# Patient Record
Sex: Female | Born: 1935 | Race: Black or African American | Hispanic: No | Smoking: Former smoker
Health system: Southern US, Community
[De-identification: ages and names within clinical notes are randomized; demographics above are authoritative.]

## PROBLEM LIST (undated history)

## (undated) DIAGNOSIS — R634 Abnormal weight loss: Secondary | ICD-10-CM

## (undated) DIAGNOSIS — J302 Other seasonal allergic rhinitis: Secondary | ICD-10-CM

## (undated) DIAGNOSIS — I872 Venous insufficiency (chronic) (peripheral): Secondary | ICD-10-CM

## (undated) DIAGNOSIS — R12 Heartburn: Secondary | ICD-10-CM

## (undated) DIAGNOSIS — R600 Localized edema: Secondary | ICD-10-CM

## (undated) DIAGNOSIS — R739 Hyperglycemia, unspecified: Secondary | ICD-10-CM

## (undated) DIAGNOSIS — C541 Malignant neoplasm of endometrium: Secondary | ICD-10-CM

## (undated) DIAGNOSIS — E785 Hyperlipidemia, unspecified: Secondary | ICD-10-CM

## (undated) DIAGNOSIS — F411 Generalized anxiety disorder: Secondary | ICD-10-CM

## (undated) DIAGNOSIS — M1990 Unspecified osteoarthritis, unspecified site: Secondary | ICD-10-CM

## (undated) DIAGNOSIS — H9190 Unspecified hearing loss, unspecified ear: Secondary | ICD-10-CM

## (undated) DIAGNOSIS — E039 Hypothyroidism, unspecified: Secondary | ICD-10-CM

## (undated) DIAGNOSIS — G4700 Insomnia, unspecified: Secondary | ICD-10-CM

## (undated) DIAGNOSIS — R609 Edema, unspecified: Secondary | ICD-10-CM

## (undated) HISTORY — DX: Malignant neoplasm of endometrium: C54.1

## (undated) HISTORY — PX: CATARACT EXTRACTION, BILATERAL: SHX1313

## (undated) HISTORY — DX: Abnormal weight loss: R63.4

## (undated) HISTORY — DX: Insomnia, unspecified: G47.00

## (undated) HISTORY — DX: Generalized anxiety disorder: F41.1

## (undated) HISTORY — DX: Hyperglycemia, unspecified: R73.9

## (undated) HISTORY — DX: Hyperlipidemia, unspecified: E78.5

---

## 1981-02-10 HISTORY — PX: THYROIDECTOMY, PARTIAL: SHX18

## 2000-09-02 ENCOUNTER — Emergency Department (HOSPITAL_COMMUNITY): Admission: EM | Admit: 2000-09-02 | Discharge: 2000-09-02 | Payer: Self-pay

## 2003-03-20 ENCOUNTER — Emergency Department (HOSPITAL_COMMUNITY): Admission: EM | Admit: 2003-03-20 | Discharge: 2003-03-20 | Payer: Self-pay | Admitting: Emergency Medicine

## 2004-03-18 ENCOUNTER — Emergency Department (HOSPITAL_COMMUNITY): Admission: EM | Admit: 2004-03-18 | Discharge: 2004-03-18 | Payer: Self-pay | Admitting: Family Medicine

## 2004-03-24 ENCOUNTER — Emergency Department (HOSPITAL_COMMUNITY): Admission: EM | Admit: 2004-03-24 | Discharge: 2004-03-24 | Payer: Self-pay | Admitting: Family Medicine

## 2005-02-10 DIAGNOSIS — C541 Malignant neoplasm of endometrium: Secondary | ICD-10-CM

## 2005-02-10 HISTORY — DX: Malignant neoplasm of endometrium: C54.1

## 2005-02-10 HISTORY — PX: ABDOMINAL HYSTERECTOMY: SHX81

## 2005-02-10 HISTORY — PX: OOPHORECTOMY: SHX86

## 2005-07-03 ENCOUNTER — Encounter: Admission: RE | Admit: 2005-07-03 | Discharge: 2005-07-03 | Payer: Self-pay | Admitting: Obstetrics & Gynecology

## 2005-07-08 ENCOUNTER — Ambulatory Visit: Admission: RE | Admit: 2005-07-08 | Discharge: 2005-07-08 | Payer: Self-pay | Admitting: Gynecologic Oncology

## 2005-08-12 ENCOUNTER — Inpatient Hospital Stay (HOSPITAL_COMMUNITY): Admission: RE | Admit: 2005-08-12 | Discharge: 2005-08-15 | Payer: Self-pay | Admitting: Obstetrics & Gynecology

## 2005-08-12 ENCOUNTER — Encounter (INDEPENDENT_AMBULATORY_CARE_PROVIDER_SITE_OTHER): Payer: Self-pay | Admitting: *Deleted

## 2005-08-29 ENCOUNTER — Ambulatory Visit: Payer: Self-pay | Admitting: Internal Medicine

## 2005-09-02 ENCOUNTER — Ambulatory Visit: Admission: RE | Admit: 2005-09-02 | Discharge: 2005-09-02 | Payer: Self-pay | Admitting: Gynecologic Oncology

## 2005-09-30 ENCOUNTER — Ambulatory Visit: Admission: RE | Admit: 2005-09-30 | Discharge: 2005-09-30 | Payer: Self-pay | Admitting: Gynecology

## 2006-01-09 ENCOUNTER — Ambulatory Visit: Admission: RE | Admit: 2006-01-09 | Discharge: 2006-01-09 | Payer: Self-pay | Admitting: Gynecology

## 2006-01-09 ENCOUNTER — Other Ambulatory Visit: Admission: RE | Admit: 2006-01-09 | Discharge: 2006-01-09 | Payer: Self-pay | Admitting: Gynecology

## 2006-01-09 ENCOUNTER — Encounter (INDEPENDENT_AMBULATORY_CARE_PROVIDER_SITE_OTHER): Payer: Self-pay | Admitting: *Deleted

## 2006-08-28 ENCOUNTER — Encounter: Admission: RE | Admit: 2006-08-28 | Discharge: 2006-08-28 | Payer: Self-pay | Admitting: Obstetrics & Gynecology

## 2006-11-03 ENCOUNTER — Other Ambulatory Visit: Admission: RE | Admit: 2006-11-03 | Discharge: 2006-11-03 | Payer: Self-pay | Admitting: Gynecologic Oncology

## 2006-11-03 ENCOUNTER — Ambulatory Visit: Admission: RE | Admit: 2006-11-03 | Discharge: 2006-11-03 | Payer: Self-pay | Admitting: Gynecologic Oncology

## 2006-11-03 ENCOUNTER — Encounter: Payer: Self-pay | Admitting: Gynecologic Oncology

## 2007-04-28 ENCOUNTER — Encounter: Payer: Self-pay | Admitting: Gynecologic Oncology

## 2007-04-28 ENCOUNTER — Encounter: Payer: Self-pay | Admitting: Internal Medicine

## 2007-04-28 ENCOUNTER — Ambulatory Visit: Admission: RE | Admit: 2007-04-28 | Discharge: 2007-04-28 | Payer: Self-pay | Admitting: Gynecologic Oncology

## 2007-04-28 ENCOUNTER — Other Ambulatory Visit: Admission: RE | Admit: 2007-04-28 | Discharge: 2007-04-28 | Payer: Self-pay | Admitting: Gynecologic Oncology

## 2007-08-11 ENCOUNTER — Ambulatory Visit: Payer: Self-pay | Admitting: Internal Medicine

## 2007-08-11 DIAGNOSIS — R5383 Other fatigue: Secondary | ICD-10-CM

## 2007-08-11 DIAGNOSIS — R5381 Other malaise: Secondary | ICD-10-CM

## 2007-08-11 DIAGNOSIS — G4700 Insomnia, unspecified: Secondary | ICD-10-CM

## 2007-08-11 DIAGNOSIS — E785 Hyperlipidemia, unspecified: Secondary | ICD-10-CM

## 2007-08-11 DIAGNOSIS — F411 Generalized anxiety disorder: Secondary | ICD-10-CM

## 2007-08-17 LAB — CONVERTED CEMR LAB
ALT: 31 units/L (ref 0–35)
Albumin: 3.5 g/dL (ref 3.5–5.2)
BUN: 17 mg/dL (ref 6–23)
Basophils Relative: 0 % (ref 0.0–1.0)
Bilirubin, Direct: 0.1 mg/dL (ref 0.0–0.3)
CO2: 31 meq/L (ref 19–32)
Calcium: 9.5 mg/dL (ref 8.4–10.5)
Creatinine, Ser: 0.7 mg/dL (ref 0.4–1.2)
Eosinophils Relative: 0.7 % (ref 0.0–5.0)
Glucose, Bld: 99 mg/dL (ref 70–99)
HCT: 41.8 % (ref 36.0–46.0)
Hemoglobin: 14.1 g/dL (ref 12.0–15.0)
LDL Cholesterol: 119 mg/dL — ABNORMAL HIGH (ref 0–99)
Lymphocytes Relative: 39.2 % (ref 12.0–46.0)
Monocytes Relative: 9.8 % (ref 3.0–12.0)
Neutro Abs: 8 10*3/uL — ABNORMAL HIGH (ref 1.4–7.7)
RBC: 4.52 M/uL (ref 3.87–5.11)
TSH: 0.94 microintl units/mL (ref 0.35–5.50)
Total CHOL/HDL Ratio: 3.6
Total Protein: 7.8 g/dL (ref 6.0–8.3)

## 2007-08-18 ENCOUNTER — Ambulatory Visit: Payer: Self-pay | Admitting: Internal Medicine

## 2007-08-18 LAB — CONVERTED CEMR LAB
HCT: 39.3 % (ref 36.0–46.0)
Monocytes Absolute: 0.5 10*3/uL (ref 0.1–1.0)
Monocytes Relative: 8.6 % (ref 3.0–12.0)
Neutrophils Relative %: 49 % (ref 43.0–77.0)
Platelets: 222 10*3/uL (ref 150–400)
RDW: 12.9 % (ref 11.5–14.6)
Vit D, 1,25-Dihydroxy: 25 — ABNORMAL LOW (ref 30–89)

## 2007-08-24 ENCOUNTER — Telehealth: Payer: Self-pay | Admitting: Internal Medicine

## 2007-08-27 ENCOUNTER — Encounter: Payer: Self-pay | Admitting: Internal Medicine

## 2007-11-18 ENCOUNTER — Ambulatory Visit: Payer: Self-pay | Admitting: Internal Medicine

## 2007-12-24 ENCOUNTER — Encounter: Payer: Self-pay | Admitting: Gynecology

## 2007-12-24 ENCOUNTER — Other Ambulatory Visit: Admission: RE | Admit: 2007-12-24 | Discharge: 2007-12-24 | Payer: Self-pay | Admitting: Gynecology

## 2007-12-24 ENCOUNTER — Ambulatory Visit: Admission: RE | Admit: 2007-12-24 | Discharge: 2007-12-24 | Payer: Self-pay | Admitting: Gynecology

## 2008-02-14 ENCOUNTER — Ambulatory Visit: Payer: Self-pay | Admitting: Internal Medicine

## 2008-02-14 DIAGNOSIS — E669 Obesity, unspecified: Secondary | ICD-10-CM

## 2008-02-15 LAB — CONVERTED CEMR LAB
Cholesterol: 222 mg/dL (ref 0–200)
Total CHOL/HDL Ratio: 2.9
Triglycerides: 92 mg/dL (ref 0–149)

## 2008-05-08 ENCOUNTER — Emergency Department (HOSPITAL_COMMUNITY): Admission: EM | Admit: 2008-05-08 | Discharge: 2008-05-08 | Payer: Self-pay | Admitting: Emergency Medicine

## 2010-03-03 ENCOUNTER — Encounter: Payer: Self-pay | Admitting: Obstetrics & Gynecology

## 2010-06-25 NOTE — Consult Note (Signed)
Doris Zamora, Doris Zamora                ACCOUNT NO.:  [id number]   MEDICAL RECORD NO.:  [id number]          PATIENT TYPE:  OUT   LOCATION:  GYN                          FACILITY:  [facility]   PHYSICIAN:  De Blanch, M.D.DATE OF BIRTH:  1930-12-06   DATE OF CONSULTATION:  12/24/2007  DATE OF DISCHARGE:                                 CONSULTATION   CHIEF COMPLAINT:  Endometrial cancer.   INTERVAL HISTORY:  Since her last visit the patient has done well.  She  has had no health problems.  She specifically denies any GI or GU  symptoms. She has no pelvic pain, pressure vaginal bleeding or  discharge.  She has undertaken weight loss program, is walking two miles  5 days a week and has lost 50 pounds.  She is also doing some dieting.   HISTORY OF PRESENT ILLNESS:  The patient underwent initial surgical  resection and staging in July of 2007.  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  She was offered adjuvant therapy, which  she declined, and she has been followed closely since that time with no  evidence of recurrent disease.   CURRENT MEDICATIONS:  Occasional nerve pill and sleeping pill,  multivitamins.   DRUG ALLERGIES:  None.   OBSTETRICAL HISTORY:  Gravida 3.   SOCIAL HISTORY:  The patient is widowed.  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   PAST SURGICAL HISTORY:  Partial thyroidectomy, foot surgery,  hysteroscopy, D and C, TAH-BSO, pelvic and periaortic lymphadenectomy.   REVIEW OF SYSTEMS:  A 10-point comprehensive review of systems was  negative except as noted above.   PHYSICAL EXAM:  VITAL SIGNS:  Weight 196 pounds, blood pressure 120/70,  pulse 80, respiratory rate 20.  GENERAL:  The patient is a healthy African American female in no acute  distress.  HEENT: Negative.  NECK:  Supple without thyromegaly. There is no supraclavicular or  inguinal adenopathy.  ABDOMEN:  Soft, nontender.  No mass, organomegaly, ascites or hernias  noted.  PELVIC:  EG, BUS vagina, and bilateral urethra are normal.  Cervix and  uterus are surgically absent.  Adnexa without masses.  Rectovaginal exam  confirms.  LOWER EXTREMITIES:  Without edema or varicosities.   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   PLAN:  The patient will return to see Dr. Varney Baas in 6 months.  Return to see [place] in 1 year.  Pap smears were obtained today.      De Blanch, M.D.  Electronically Signed     DC/MEDQ  D:  12/24/2007  T:  12/24/2007  Job:  [id number]   cc:   Freddy Finner, M.D.  Fax: [phone]   Barbette Hair. Artist Pais, DO  [address] [place], [place] [address]   Telford Nab, R.N.  [address]  [place], [place] [address]

## 2010-06-25 NOTE — Consult Note (Signed)
Doris Zamora, Doris Zamora                ACCOUNT NO.:  [id number]   MEDICAL RECORD NO.:  [id number]          PATIENT TYPE:  OUT   LOCATION:  GYN                          FACILITY:  [facility]   PHYSICIAN:  Paola A. Duard Brady, MD    DATE OF BIRTH:  06-07-1930   DATE OF CONSULTATION:  11/03/2006  DATE OF DISCHARGE:                                 CONSULTATION   The patient is a 77 year old with a stage IB, grade 3 serous carcinoma  of the endometrium who underwent initial surgery in July 2007.  Her  pathology revealed no malignant cells.  Within the uterus she had a  poorly differentiated carcinoma with features of a serous carcinoma and  poorly differentiated endometrioid adenocarcinoma with superficial  myometrial invasion.  On pathology she had negative cervix, negative  tubes and ovaries.  She had 0/9 lymph nodes.  The patient was offered  adjuvant therapy which she declined and has been undergoing close follow-  up.  She overall is doing quite well since we last saw her and really  has no significant complaints.   REVIEW OF SYSTEMS:  She denies any chest pain, shortness of breath,  nausea, vomiting, fevers, chills, headaches or visual changes.  She  denies any vaginal bleeding, change in bowel or bladder habits,  abdominal bloating or abdominal pain.   MEDICATIONS:  Multivitamins.   ALLERGIES:  NONE.   SOCIAL HISTORY:  Unchanged.   PHYSICAL EXAMINATION:  Weight 232 pounds which is up 4 pounds from her  last visit. A well-nourished, well-developed female in no acute  distress.  NECK:  Supple.  There is no lymphadenopathy, no thyromegaly.  LUNGS:  Clear to auscultation bilaterally.  CARDIOVASCULAR:  Regular rate and rhythm.  ABDOMEN:  Obese, soft, nontender, nondistended.  No palpable mass or  hepatosplenomegaly.  She had a well-healed vertical skin incision was no  evidence of any hernias.  Groins are negative for adenopathy.  EXTREMITIES:  She has 1+ bilateral lower extremity edema.  PELVIC:  External genitalia is within normal limits.  The vagina is  unremarkable.  It is atrophic.  The vaginal cuff is visualized.  No  visible lesions.  Pap smear was obtained.  Bimanual examination reveals  no masses or nodularity.  Rectal confirms.   ASSESSMENT:  A 77 year old with stage IB grade 3 serous carcinoma of the  endometrium with no evidence of recurrent disease.   PLAN:  We will follow up on the results of her Pap smear from today.  She will return to see [place] in 6 months and see Dr. Jennette Kettle in 3 months.      Paola A. Duard Brady, MD  Electronically Signed     PAG/MEDQ  D:  11/03/2006  T:  11/04/2006  Job:  [id number]   cc:   Telford Nab, R.N.  [address]  [place], [place] [address]   Barbette Hair. [place], DO  [address] [place], [place] [address]   W. Varney Baas, M.D.  Fax: [phone]

## 2010-06-25 NOTE — Consult Note (Signed)
NAMEELANIE, HAMMITT                ACCOUNT NO.:  [id number]   MEDICAL RECORD NO.:  [id number]          PATIENT TYPE:  OUT   LOCATION:  GYN                          FACILITY:  [facility]   PHYSICIAN:  Paola A. Duard Brady, MD    DATE OF BIRTH:  Oct 17, 1930   DATE OF CONSULTATION:  04/28/2007  DATE OF DISCHARGE:                                 CONSULTATION   Doris Zamora is a very pleasant [age] year old with stage IB  grade 3 serous carcinoma of the endometrium who underwent initial  surgery in July 2007.  Final pathology revealed a poorly differentiated  carcinoma with features of a serous carcinoma and poorly differentiated  endometrium adenocarcinoma with superficial myometrial invasion.  She  had negative cervix, negative tubes and ovaries, and negative lymph  nodes.  She was offered adjuvant therapy, which she declined and she has  been offered close followup since that time.  Fortunately, she has had  no evidence of recurrent disease.  She comes in today for followup.  She  was last seen by [place] in September 2008 with negative exam and negative  Pap smear.  She was seen by Dr. Jennette Kettle per her report 3 months ago, again  with a negative exam.   REVIEW OF SYSTEMS:  She has any chest pain short of breath, nausea,  vomiting, fevers, chills, headaches or visual changes.  She has any  vaginal bleeding, change in bowel or bladder habits, abdominal bloating,  abdominal pain.  She states she is anxious to start exercising.  She has  been gaining weight, but has really been quite sedentary.  In addition  some of her family members have needed her to help them with different  issues and she has been too busy running around taking care of them to  take the time to take care of herself.   MEDICATIONS:  None.  She does take multivitamins.   ALLERGIES:  None.   PHYSICAL EXAMINATION:  Weight 235 pounds which is up 3 pounds since her  last visit.  Well-nourished, alert female in no acute  distress.  NECK:  Supple with no lymphadenopathy, no thyromegaly.  Lungs were clear to auscultation bilaterally.  CARDIOVASCULAR:  Regular rate and rhythm.  Abdomen is obese.  She has a well-healed vertical skin incision.  As no  evidence of incisional hernia.  Abdomen is soft, nontender,  nondistended.  No palpable masses or hepatosplenomegaly.  Groins are  negative for adenopathy.  EXTREMITIES:  She has 1+ pitting edema equal bilaterally in the ankles.  PELVIC:  External genitalia is within normal limits.  Vagina is markedly  atrophic.  The vaginal cuff is visualized.  No visible lesions.  ThinPrep Pap was without difficulty.  Bimanual examination reveals no  masses or nodularity.  Rectal confirms.   ASSESSMENT:  A 77 year old with a stage IB grade 3 endometrioid and  serous carcinoma of the endometrium who has no clinical evidence of  recurrent disease.   PLAN:  Follow results for Pap smear from today.  She will see Dr. Jennette Kettle  in 3 months.  Return to see [place]  in 6 months.  She knows to contact us if  she has any symptoms of increased abdominal bloating, bleeding, pelvic  pain, pressure, shortness of breath or cough.      Paola A. Duard Brady, MD  Electronically Signed     PAG/MEDQ  D:  04/28/2007  T:  04/28/2007  Job:  [id number]   cc:   Freddy Finner, M.D.  Fax: [phone]   Barbette Hair. Artist Pais, DO  [address] [place], [place] [address]   Telford Nab, R.N.  [address]  [place], [place] [address]

## 2010-06-28 NOTE — Consult Note (Signed)
NAMESHERIN, MURDOCH                ACCOUNT NO.:  [id number]   MEDICAL RECORD NO.:  [id number]          PATIENT TYPE:  OUT   LOCATION:  GYN                          FACILITY:  [facility]   PHYSICIAN:  De Blanch, M.D.DATE OF BIRTH:  December 11, 1930   DATE OF CONSULTATION:  01/09/2006  DATE OF DISCHARGE:                                 CONSULTATION   CHIEF COMPLAINT:  Endometrial cancer.   INTERVAL HISTORY:  Since her last visit, the patient has done well. She  denies any GI or GU symptoms. Has no pelvic pain or pressure, vaginal  bleeding or discharge. Functional status is excellent.   HISTORY OF PRESENT ILLNESS:  The patient has a stage IIB grade 3  endometrial carcinoma undergoing initial surgery July 2007. She chose  not to receive any postoperative radiation therapy or chemotherapy and  has been followed since that time with no evidence of recurrent disease.   PAST MEDICAL HISTORY:  Medical illnesses:  None.   PAST SURGICAL HISTORY:  1. Partial thyromegaly.  2. Foot surgery.  3. Hysteroscopy.  4. D&C.  5. TAH, BSO, pelvic and periaortic lymphadenectomy.   OBSTETRICAL HISTORY:  Gravida 3.   MEDICATIONS:  Multivitamins.   DRUG ALLERGIES:  None.   SOCIAL HISTORY:  The patient is widowed. 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   REVIEW OF SYSTEMS:  Ten-point comprehensive review of systems negative  except as noted above.   PHYSICAL EXAMINATION:  Weight 228 pounds, blood pressure 130/70, pulse  80, respiratory rate 20.  GENERAL:  The patient is a pleasant obese black female in no acute  distress.  HEENT:  Is negative.  NECK:  Is supple without thyromegaly. There is supraclavicular or  inguinal adenopathy.  ABDOMEN:  Is soft and nontender. No masses, organomegaly, ascites or  hernias noted. Midline incision is well.  PELVIC:  EGBUS, vagina, bladder, urethra are normal. Cervix and uterus  surgically absent. Adnexa without masses. Rectovaginal exam  confirms.  LOWER EXTREMITIES:  1+ ankle edema.   IMPRESSION:  Stage IIB grade 3 endometrial carcinoma. No evidence of  recurrent disease.   PLAN:  Pap smear was obtained. The patient will return to see Dr. Konrad Dolores, her primary gynecologist, in three months and return to see [place] in  six months.      De Blanch, M.D.  Electronically Signed     DC/MEDQ  D:  01/09/2006  T:  01/10/2006  Job:  [id number]

## 2010-06-28 NOTE — Consult Note (Signed)
Doris Zamora, Doris Zamora                ACCOUNT NO.:  [id number]   MEDICAL RECORD NO.:  [id number]          PATIENT TYPE:  OUT   LOCATION:  GYN                          FACILITY:  [facility]   PHYSICIAN:  John T. Kyla Balzarine, M.D.    DATE OF BIRTH:  1930/03/10   DATE OF CONSULTATION:  07/08/2005  DATE OF DISCHARGE:                                   CONSULTATION   CHIEF COMPLAINT:  This 77 year old woman is seen at the request of W. Varney Baas, M.D. for recommendations regarding management of grade 3 endometrioid  adenocarcinoma.  The patient underwent menopause of greater than 20 years  ago.  She was on some type of hormonal cream given to her by Tod Persia, but has not taken this for many years.  In March, she had an  episode of back pain followed by vaginal bleeding which stopped  spontaneously.  She presented to her M.D. 1-1/2 months later after self-  medication with Goldenseal.  She denies active bleeding but underwent D&C on  05/15 along with hysteroscopy after Pap smear revealed AGUS and ultrasound  showed marked endometrial thickening.  Findings at hysteroscopy included  polypoid endometrium.  Final pathology revealed poorly differentiated  endometrioid carcinoma involving both endometrial and endocervical  curettings.   PAST MEDICAL HISTORY:  The patient has not been followed routinely for  medical care.  She underwent partial thyroidectomy in 1983 and states that  she had normal thyroid function tests approximately 20 years ago.  She has  undergone foot surgery and recent hysteroscopic D&C.  She is status post  NSVD x3.   MEDICATIONS:  Vitamins and assorted herbs.   ALLERGIES:  None known.   PERSONAL AND SOCIAL HISTORY:  Widowed, retired [employer] operational officer.  She smoked until  9 years ago and has been abstinent since.  She admits to occasional social  ethanol.   REVIEW OF SYSTEMS:  Other than noted above, negative in 10 systems.  The  patient is active without limitations.   PHYSICAL EXAMINATION:  VITAL SIGNS:  Weight 230 pounds, height 5 feet 6  inches, blood pressure 138/76, pulse 68, temperature afebrile.  GENERAL:  The patient is alert and oriented x3 in no acute distress.  ENT:  Has benign with clear oropharynx and no scleral icterus.  Normal  extraocular movements.  NECK:  Supple without goiter.  LUNGS:  Lung fields clear.  HEART:  Regular with no JVD.  Lymph survey negative for pathologic  lymphadenopathy.  There is no back or CVA tenderness.  ABDOMEN:  The abdomen is soft and benign.  No ascites, tenderness, mass,  ascites or organomegaly.  EXTREMITIES:  Full strength and range of motion without edema.  PELVIC:  External genitalia and [place] normal inspection, palpation.  Bladder,  urethra are normal.  Cervix is mobile without gross lesions although it is  slightly wide but is not particularly firm to palpation.  Bimanual and  rectovaginal examinations reveal upper limits size uterus with no adnexal  pathology palpable.  No parametrial nodularity.   ASSESSMENT:  Clinical stage 2 endometrial carcinoma, grade 3.  PLAN:  I had a long discussion with the patient and her daughter.  The  patient wishes surgery as soon as feasible.  I discussed alternatives of a  laparoscopic or open abdominal approach.  She is at borderline in size for  what I would consider laparoscopic staging to be feasible for.  We will  check our schedule at [facility]; Dr. Jennette Kettle was unable to be available for surgery  at [facility] next week and our next available appointment with the  availability of laparoscopic staging of endometrial cancer would be 4 weeks  or more away at [facility].      Jonny Ruiz T. Kyla Balzarine, M.D.  Electronically Signed     JTS/MEDQ  D:  07/08/2005  T:  07/08/2005  Job:  [id number]   cc:   Freddy Finner, M.D.  Fax: [phone]   Telford Nab, R.N.  [address]  [place], [place] [address]

## 2010-06-28 NOTE — Consult Note (Signed)
NAMERANE, DUMM                ACCOUNT NO.:  [id number]   MEDICAL RECORD NO.:  [id number]          PATIENT TYPE:  OUT   LOCATION:  GYN                          FACILITY:  [facility]   PHYSICIAN:  John T. Kyla Balzarine, M.D.    DATE OF BIRTH:  1930/09/25   DATE OF CONSULTATION:  09/02/2005  DATE OF DISCHARGE:                                   CONSULTATION   CHIEF COMPLAINT:  Follow-up of pathology after surgery for endometrial  cancer.   HISTORY:  The patient was explored by Dr. Stanford Breed on August 12, 2005,  undergoing TAH/BSO, pelvic and aortic lymphadenectomy and peritoneal  washings for a high-grade endometrial treatment cancer.  She has had an  uncomplicated convalescence and returns for incision check and discussion of  pathology.   EXAM:  VITAL SIGNS:  Weight 223 pounds, temperature afebrile and vital signs  stable.  ABDOMEN:  Inspection of the abdomen reveals a well-healed midline incision  without erythema or fascial defect on Valsalva.  The incision is nontender.  EXTREMITIES:  No edema.  PELVIC:  Deferred per patient request.   LAB:  Final pathology revealed a poorly-differentiated mixed serous and  endometrioid FIGO grade 3 carcinoma of the endometrium.  Tumor invaded 0.4  out of 1.8 cm total myometrium.  Cervix, tubes and ovaries, 9 total lymph  nodes and washings were negative (stage I-B, grade 3 lesion).   ASSESSMENT:  Stage I-B, grade 3 a papillary serous carcinoma of the  endometrium, convalescing from surgery.   PLAN AND RECOMMENDATIONS:  I had a long discussion with the patient  regarding her pathology.  I would estimate her risk of recurrence somewhere  in the neighborhood of 40% with no further treatment.  We discussed  potential use of vaginal brachytherapy and sandwich treatment with  chemotherapy versus chemotherapy alone.  The patient is quite apprehensive  about any further treatment and I spent considerable time explaining the  rationale,  effects and side  effects of chemotherapy.  She does not wish to make a  decision regarding adjuvant therapy currently and will return for pelvic  examination at 5-6 weeks postoperatively.  I would strongly consider  sandwich therapy with Taxol and carboplatin combined with careful radiation.      John T. Kyla Balzarine, M.D.  Electronically Signed     JTS/MEDQ  D:  09/02/2005  T:  09/03/2005  Job:  [id number]   cc:   Freddy Finner, M.D.  Fax: [phone]   Roseanna Rainbow, M.D.  Fax: [phone]   Telford Nab, R.N.  [address]  [place], [place] [address]

## 2010-06-28 NOTE — Op Note (Signed)
NAMELACHELE, LIEVANOS                ACCOUNT NO.:  [id number]   MEDICAL RECORD NO.:  [id number]          PATIENT TYPE:  INP   LOCATION:  0004                         FACILITY:  [facility]   PHYSICIAN:  De Blanch, M.D.DATE OF BIRTH:  06-Feb-1931   DATE OF PROCEDURE:  08/12/2005  DATE OF DISCHARGE:                                 OPERATIVE REPORT   PREOPERATIVE DIAGNOSIS:  Grade 3 endometrial adenocarcinoma.   POSTOPERATIVE DIAGNOSIS:  Grade 3 endometrial adenocarcinoma.   PROCEDURE:  Total abdominal hysterectomy, bilateral salpingo-oophorectomy,  pelvic and periaortic lymphadenectomy.   SURGEON:  De Blanch, M.D.   FIRST ASSISTANT:  Roseanna Rainbow, M.D.; Telford Nab, R.N.   ANESTHESIA:  General orotracheal tube.   ESTIMATED BLOOD LOSS:  200 mL.   SURGICAL FINDINGS:  At the time of exploratory laparotomy, the upper abdomen  was normal.  The uterus was normal-sized with what appeared to be a  paracervical fibroid on the left, measuring approximately 4 cm.  Tubes and  ovaries appeared normal on frozen section.  We were told that there was  invasion but not to the outer half of the myometrium.  The pelvic lymph  nodes were somewhat thickened.   PROCEDURE:  The patient brought to the operating room and after satisfactory  attainment of general anesthesia, was placed in modified lithotomy position  in [place] stirrups.  Anterior abdominal wall, perineum and vagina were  prepped with Betadine, Foley catheter was placed, and the patient was  draped.  The abdomen was entered through a midline incision.  Peritoneal  washings were obtained from the pelvis.  The upper abdomen and pelvis were  explored with the above-noted findings.  A Bookwalter retractor was  assembled and bowel was packed out of pelvis.  The uterus was grasped with  long Kelly clamps.  Round ligaments were divided with cautery and the  retroperitoneal spaces opened, identifying the sidewall  vessels, ureter and  the paravesical and pararectal spaces.  The ovarian vessels were  skeletonized, clamped, cut, free tied and suture ligated.  Bladder flap was  advanced through sharp and blunt dissection.  The uterine vessels were  skeletonized, clamped, cut, suture ligated with 2-0 Vicryl suture.  In  stepwise fashion paracervical and cardinal ligaments were clamped, cut and  suture ligated.  Vaginal angles were crossclamped and the vagina transected  at its junction with the cervix.  The uterus, cervix, tubes and ovaries were  sent to pathology for frozen section with the above-noted findings.  Vaginal  cuff was closed with interrupted figure-of-eight sutures of 0 Vicryl.   The retractors repositioned and the pelvic sidewalls exposed.  It was noted  the patient had very tortuous external iliac arteries bilaterally.  The  lymph node bearing tissue overlying the external artery, external vein,  internal iliac artery and obturator fossa was removed.  Hemostasis was  primarily achieved with cautery and some hemoclips.  Care was taken to avoid  vascular injury or injury to the obturator nerve which was fully visualized.  Similar procedure was performed on both sides of the pelvis.   The retractors were  then repositioned and the incision extended so as to  allow exposure of the aortic chain.  The peritoneum overlying the right  common iliac artery and aorta was incised.  The right ureter was identified  and reflected laterally.  The duodenum was elevated superiorly.  Lymph node  bearing tissue overlying the vena cava and aorta was removed with some  difficulty.  Exposure was difficult, given the patient's obesity.  Again,  hemostasis achieved with cautery, and there were no complications throughout  this dissection.   The surgical bed was inspected and found to be hemostatic, both the aortic  chain and pelvis.  The pelvis was irrigated with saline.   Packs and retractors were removed  and the anterior abdominal wall was closed  in layers, the first being a running MASS closure using #1 PDS.  Subcutaneous tissue was irrigated.  Hemostasis achieved with cautery and the  skin closed with staples.  The patient was awakened from anesthesia and  taken to the recovery room in satisfactory condition.  Sponge, needle was  then counts correct x2.      De Blanch, M.D.  Electronically Signed     DC/MEDQ  D:  08/12/2005  T:  08/12/2005  Job:  [id number]   cc:   Freddy Finner, M.D.  Fax: [phone]   Roseanna Rainbow, M.D.  Fax: [phone]   Telford Nab, R.N.  [address]  [place], [place] [address]

## 2010-06-28 NOTE — Consult Note (Signed)
Doris Zamora, Doris Zamora                ACCOUNT NO.:  [id number]   MEDICAL RECORD NO.:  [id number]          PATIENT TYPE:  OUT   LOCATION:  GYN                          FACILITY:  [facility]   PHYSICIAN:  De Blanch, M.D.DATE OF BIRTH:  [birth year]   DATE OF CONSULTATION:  09/30/2005  DATE OF DISCHARGE:  09/30/2005                                   CONSULTATION   CHIEF COMPLAINT:  Endometrial cancer.   INTERVAL HISTORY:  The patient continues to recover from surgery which was  performed on August 12, 2005.  She specifically returns today to discuss her  pathology report and treatment options.  Final pathology of her hysterectomy  specimen showed a poorly differentiated endometrial cancer invading 4 mm out  of an 18 mm myometrial thickness.  All lymph nodes and peritoneal washings  were negative (stage IB grade 3 endometrial cancer).   PAST MEDICAL HISTORY:   MEDICAL ILLNESSES:  None.   PAST SURGICAL HISTORY:  1. Partial thyroidectomy 1983.  2. Foot surgery.  3. Hysteroscopy.  4. D&C.   OBSTETRICAL HISTORY:  Gravida 3.   MEDICATIONS:  Vitamins.   DRUG ALLERGIES:  None.   SOCIAL HISTORY:  Widowed.  The patient is a retired [employer] operational officer.  She is a  prior smoker.   REVIEW OF SYSTEMS:  10-point comprehensive review of systems negative except  as noted above.   PHYSICAL EXAMINATION:  VITAL SIGNS:  Weight 222 pounds.  ABDOMEN:  Soft, nontender.  Midline incision is healing well.  PELVIC:  EGBUS, vagina, bladder, urethra are normal.  Cervix and uterus  surgically absent.  Adnexa without masses.  Cuff is healing nicely.  EXTREMITIES:  Lower extremities without edema or varicosities.   IMPRESSION:  Stage IB grade 3 endometrial cancer.  Discussed the pathology  with the patient and her family as well as the usual recommendation for  consideration of pelvic radiation and possible sandwich therapy using  chemotherapy.  The patient is aware that radiation would result in decrease  in  local recurrence and we believe systemic therapy might decrease distant  recurrence.  On the other hand the patient understands that close  observation is an option and that we do not have prospective randomized data  from clinical trials showing an improvement in  survival.  After considering all pros and cons, potential side effects, and  potential benefits, the patient wishes not to receive any adjuvant therapy  and will be followed expectantly.  She will return to see [place] in three months  for continued evaluation.      De Blanch, M.D.  Electronically Signed     DC/MEDQ  D:  10/03/2005  T:  10/03/2005  Job:  [id number]   cc:   Freddy Finner, M.D.  Fax: [phone]   Telford Nab, R.N.  [address]  [place], [place] [address]

## 2010-06-28 NOTE — Discharge Summary (Signed)
Doris Zamora, Doris Zamora                ACCOUNT NO.:  [id number]   MEDICAL RECORD NO.:  [id number]          PATIENT TYPE:  INP   LOCATION:  1613                         FACILITY:  [facility]   PHYSICIAN:  Roseanna Rainbow, M.D.DATE OF BIRTH:  Jul 16, 1930   DATE OF ADMISSION:  08/12/2005  DATE OF DISCHARGE:  08/15/2005                                 DISCHARGE SUMMARY   CHIEF COMPLAINT:  The patient is a 77 year old with a recently diagnosed  grade 3 endometrioid adenocarcinoma who presents for operative management.  Please see the dictated operative summary as per Dr. Ronita Hipps for further  details.   HOSPITAL COURSE:  The patient was admitted.  She underwent a total abdominal  hysterectomy and bilateral salpingo-oophorectomy with pelvic and aortic  lymphadenectomies.  Please see the dictated operative summary.  On  postoperative day one, her hemoglobin was 11 and her electrolytes were  normal. The remainder of her hospital course was uneventful.  Her diet was  advanced.  She was discharged to home on postoperative day three  tolerating  a regular diet.   DISCHARGE DIAGNOSIS:  Grade 3 endometrial cancer.   PROCEDURE:  Abdominal hysterectomy and bilateral salpingo-oophorectomy,  pelvic and aortic lymphadenectomies.   CONDITION:  Stable.   DIET:  Regular.   ACTIVITY:  Increase activity slowly, no sexual activity for six weeks, no  driving for two weeks, no lifting for six weeks.   MEDICATIONS:  Resume home medications, Percocet 1-2 tabs every 6 hours as  needed.   DISPOSITION:  The patient was to follow up in the GYN oncology office on  July9.      Roseanna Rainbow, M.D.  Electronically Signed     LAJ/MEDQ  D:  09/24/2005  T:  09/24/2005  Job:  [id number]   cc:   Telford Nab, R.N.  [name]  [place], [place] [address]   W. Varney Baas, M.D.  Fax: [phone]

## 2010-06-28 NOTE — Assessment & Plan Note (Signed)
[facility]                             PRIMARY CARE OFFICE NOTE   NAME:Fuelling, Rhoda M                       MRN:          [id number]  DATE:08/29/2005                            DOB:          May 03, 1930    CHIEF COMPLAINT:  New patient to the practice.   HISTORY OF PRESENT ILLNESS:  The patient is a 77 year old African American  female, here to establish primary care.  The patient has not had a primary  care doctor in the past.   PAST MEDICAL HISTORY:  Notable for recent hospitalization for endometrial  carcinoma.  She underwent total abdominal hysterectomy and bilateral  salpingo-oophorectomy as well as pelvic and periaortic lymphadenectomy.  The  patient, in March 2007, had episode of back pain followed by vaginal  bleeding.  She underwent a hysteroscopy as well as D&C.  Ultrasound showed  marked endometrial thickening and pathology showed poorly differentiated  endometrioid carcinoma.  The patient has recovered quite well from this  surgery.  She states that her abdominal incision has healed without  problems.  Her other past medical history is significant for thyroidectomy  in 1983.  The patient is a somewhat poor historian, and states that her  thyroid was not working and she underwent a thyroidectomy.  She was on  Synthroid for 1-2 years, but she thought that the Synthroid medication  caused facial swelling.  She discontinued Synthroid medication on her own,  and reports that on followup blood tests, her thyroid function had returned  to normal, and has not been on thyroid replacement for many years.  Only  other history is positive hepatitis in 1983, presumed hepatitis B that has  cleared.  She denies any history of chronic or active hepatitis.   SOCIAL HISTORY:  The patient is widowed, has 3 children.  Two of the  children are still living with her.  She is a retired [employer] operational officer.   HABITS:  She denies any alcohol use.  Quit tobacco  approximately 9 years  ago, but smoked for 40 years before quitting.   CURRENT MEDICATIONS:  She does not take any prescription medications, but  takes multiple supplements, including:  1.  Alpha lipoic acid 300 mg 1 a day.  2.  Coenzyme Q once a day.  3.  Caltrate 600 mg once a day.  4.  Chromium 400 mg once a day.  5.  Coral Calcium Complete once a day.  6.  Omega 3 fatty acids once a day.   ALLERGIES:  REPORTED ALLERGIES TO FLU AND PNEUMONIA SHOTS.   FAMILY HISTORY:  Mother deceased at age 86, had type 2 diabetes.  Father  deceased at age 85 of unknown cause.   The patient has not had any colon screening.   REVIEW OF SYSTEMS:  The patient denies any fevers, chills.  No HEENT  symptoms.  Denies chest pain or shortness of breath.  She patient has lost  some weight since her surgery, but her appetite is returning.   Review of her laboratory data from her previous hospitalization is notable  for elevated blood  sugar, and this was obtained at 5:30 in the morning,  presumed fasting, at 133.  The patient had mild anemia of 11.5 and 34.3 H&H.  Thyroid studies were not performed.  The patient had a pre-op chest x-ray.  Lungs showed mild diffuse peribronchial thickening, but no evidence of  active infiltrate or edema.   PHYSICAL EXAMINATION:  VITAL SIGNS:  Height is 5 feet 5-1/2 inches, weight  is 224 pounds, temperature is 97.4, pulse is 70, BP is 141/75 in the left  arm in the seated position.  GENERAL:  The patient is a pleasant, overweight 77 year old African-American  female in no apparent distress.  HEENT:  Normocephalic, atraumatic.  Pupils were equal and reactive to light  bilaterally.  Extraocular motility was intact.  The patient was anicteric.  The conjunctivae were within normal limits.  External auditory canals and  tympanic membranes were clear bilaterally.  Hearing was grossly normal.  Oropharyngeal exam revealed upper and lower dental plates, otherwise   unremarkable.  NECK:  Supple, no adenopathy or carotid bruit.  The patient had lower neck  scar from previous thyroidectomy.  CHEST:  Normal respiratory effort.  The patient had mild decreased breath  sounds at the bases, but no rhonchi, rales or wheezing.  CARDIOVASCULAR:  Regular rate and rhythm.  No significant no murmurs, rubs  or gallops appreciated.  ABDOMEN:  Soft.  The patient had midline scar that was healing.  No  surrounding erythema.  MUSCULOSKELETAL:  No clubbing, cyanosis or edema.  SKIN:  Warm and dry.  EXTREMITIES:  The patient had slightly diminished pedis dorsalis pulses of  her lower extremities.  NEUROLOGIC:  Cranial nerves II-XII intact.  She was nonfocal.   IMPRESSION/RECOMMENDATIONS:  1.  Grade 3 endometrial adenocarcinoma, status post total abdominal      hysterectomy and bilateral salpingo-oophorectomy.  2.  History of thyroid disorder, status post thyroidectomy in 1983, unknown      thyroid function.  3.  Abnormal glucose, possible type 2 diabetes.  4.  Elevated blood pressure without diagnosis of hypertension.  5.  Obesity.  6.  Health maintenance.   RECOMMENDATIONS:  The patient will follow up with Dr. De Blanch  regarding endometrial adenocarcinoma.  She will be sent for followup labs,  including hemoglobin A1C and repeat fasting blood sugar.  Certainly  consistent with her family history and her obesity, is that the patient is  likely a type 2 diabetic.  She has been trying to decrease her caloric  intake and lose weight.   Her blood pressure is elevated today.  I have asked the patient to keep a  log of at least 10 blood pressure readings before our followup visit in  approximately 2-3 weeks.   Her thyroid functions will be repeated, including a TSH, free T3 and a free  T4.  Followup time is approximately 2-3 weeks.                                   Barbette Hair. Artist Pais, DO   RDY/MedQ  DD:  08/29/2005  DT:  08/30/2005  Job #:  [id number]

## 2011-02-24 ENCOUNTER — Encounter: Payer: Self-pay | Admitting: Internal Medicine

## 2011-02-25 ENCOUNTER — Ambulatory Visit (INDEPENDENT_AMBULATORY_CARE_PROVIDER_SITE_OTHER): Payer: Medicare Other | Admitting: Internal Medicine

## 2011-02-25 ENCOUNTER — Encounter: Payer: Self-pay | Admitting: Internal Medicine

## 2011-02-25 DIAGNOSIS — E669 Obesity, unspecified: Secondary | ICD-10-CM

## 2011-02-25 DIAGNOSIS — F411 Generalized anxiety disorder: Secondary | ICD-10-CM

## 2011-02-25 DIAGNOSIS — Z6835 Body mass index (BMI) 35.0-35.9, adult: Secondary | ICD-10-CM

## 2011-02-25 MED ORDER — ZOLPIDEM TARTRATE 5 MG PO TABS
5.0000 mg | ORAL_TABLET | Freq: Every evening | ORAL | Status: DC | PRN
Start: 2011-02-25 — End: 2011-04-02

## 2011-02-25 MED ORDER — ALPRAZOLAM 0.5 MG PO TABS: 0.5000 mg | ORAL_TABLET | Freq: Three times a day (TID) | ORAL | Status: DC | PRN

## 2011-02-25 NOTE — Assessment & Plan Note (Signed)
had a bout of anxiety in '09 - responded well to xanax. Also had sleep trouble at that time and did well with ambien. She only took medications for a short period of time.   She has recurrent, similar symptoms.  Plan - Alprazolam 0.5 mg tid prn            Zolpidem 5 mg qhs prn

## 2011-02-25 NOTE — Patient Instructions (Signed)
Anxiety and sleep disruption: plan Xanax (alprazolam 0.5 mg) every 6 hours as needed; Ambien 5 mg at bedtime as needed for sleep.  Weight management: smart food choices; PORTION SIZE CONTROL  And the meal of 1,000 chews; exercise every day for at least 30 minutes. Target weight 165 lb (53 lbs to loose); goal  - loose 1-2 lbs per month.

## 2011-02-25 NOTE — Progress Notes (Signed)
  Subjective:    Patient ID: Doris Zamora, female    DOB: 1930-05-20, 78 y.o.   MRN: [id number]  HPI Doris Zamora presents for anxiety and insomnia. She went on a New Year's diet and this has disrupted her normal routines leading to sleep disruption and anxiety. This is similar to an episode in July '09 when she was treated by Dr. Jonny Ruiz with Remus Loffler and xanax. She is still concerned about weight loss.   Past Medical History  Diagnosis Date  . ANXIETY 08/11/2007  . FATIGUE 08/11/2007  . HYPERLIPIDEMIA 08/11/2007  . INSOMNIA-SLEEP DISORDER-UNSPEC 08/11/2007  . WEIGHT LOSS 02/14/2008  . Endometrial cancer    Past Surgical History  Procedure Date  . Thyroidectomy, partial 1983  . Abdominal hysterectomy 2007  . Oophorectomy    Family History  Problem Relation Age of Onset  . Diabetes Mother    History   Social History  . Marital Status: Widowed    Spouse Name: N/A    Number of Children: N/A  . Years of Education: N/A   Occupational History  . Not on file.   Social History Main Topics  . Smoking status: Former Smoker -- 50 years    Types: Cigarettes  . Smokeless tobacco: Never Used  . Alcohol Use: No  . Drug Use: No     admiited to poat use of marjuana to help her sleep  . Sexually Active: Not on file   Other Topics Concern  . Not on file   Social History Narrative         Widow. 3 children - 2 living. retired Dipl[employer] Services operational officer       Review of Systems System review is negative for any constitutional, cardiac, pulmonary, GI or neuro symptoms or complaints other than as described in the HPI.     Objective:   Physical Exam Filed Vitals:   02/25/11 1116  BP: 130/78  Pulse: 74  Temp: 97.2 F (36.2 C)   Gen'l- obese AA woman in no distress HEENT - C&S clear Respirations - normal Cor- RRR       Assessment & Plan:

## 2011-02-25 NOTE — Assessment & Plan Note (Signed)
Patient wants to loose weight. Discussed weight MANAGEMENT: smart food choices, portion size control, exercise.  Plan - target weight 165 lb (53 lb loss); goal 1-2 lbs off per month.  (greater than 50% of a 30 min visit spent on education and counseling)

## 2011-04-02 ENCOUNTER — Encounter: Payer: Self-pay | Admitting: Internal Medicine

## 2011-04-02 ENCOUNTER — Ambulatory Visit (INDEPENDENT_AMBULATORY_CARE_PROVIDER_SITE_OTHER): Payer: Medicare Other | Admitting: Internal Medicine

## 2011-04-02 DIAGNOSIS — R1013 Epigastric pain: Secondary | ICD-10-CM

## 2011-04-02 DIAGNOSIS — J309 Allergic rhinitis, unspecified: Secondary | ICD-10-CM

## 2011-04-02 MED ORDER — ZOLPIDEM TARTRATE 5 MG PO TABS: 5.0000 mg | ORAL_TABLET | Freq: Every evening | ORAL | Status: DC | PRN

## 2011-04-02 NOTE — Patient Instructions (Signed)
There is no sign of any bacterial infection that would require antibiotics. Physical exam is normal: ears, throat, lungs. This is either a viral upper respiratory infection vs allergic rhinitis.  Plan - over the counter loratadine (claritin) once a day           Robitussin DM 1 tsp every 4 hours as needed ( the generic is fine)           Call for fever, dark mucus, increased shortness of breath.  Indigestion - for the indigestion you can take Ranitidine 150 mg twice a day for 14 days, then once day for 2 weeks and then just as needed.    Allergic Rhinitis Allergic rhinitis is when the mucous membranes in the nose respond to allergens. Allergens are particles in the air that cause your body to have an allergic reaction. This causes you to release allergic antibodies. Through a chain of events, these eventually cause you to release histamine into the blood stream (hence the use of antihistamines). Although meant to be protective to the body, it is this release that causes your discomfort, such as frequent sneezing, congestion and an itchy runny nose.   CAUSES   The pollen allergens may come from grasses, trees, and weeds. This is seasonal allergic rhinitis, or "hay fever." Other allergens cause year-round allergic rhinitis (perennial allergic rhinitis) such as house dust mite allergen, pet dander and mold spores.   SYMPTOMS    Nasal stuffiness (congestion).     Runny, itchy nose with sneezing and tearing of the eyes.     There is often an itching of the mouth, eyes and ears.  It cannot be cured, but it can be controlled with medications. DIAGNOSIS   If you are unable to determine the offending allergen, skin or blood testing may find it. TREATMENT    Avoid the allergen.     Medications and allergy shots (immunotherapy) can help.     Hay fever may often be treated with antihistamines in pill or nasal spray forms. Antihistamines block the effects of histamine. There are over-the-counter  medicines that may help with nasal congestion and swelling around the eyes. Check with your caregiver before taking or giving this medicine.  If the treatment above does not work, there are many new medications your caregiver can prescribe. Stronger medications may be used if initial measures are ineffective. Desensitizing injections can be used if medications and avoidance fails. Desensitization is when a patient is given ongoing shots until the body becomes less sensitive to the allergen. Make sure you follow up with your caregiver if problems continue. SEEK MEDICAL CARE IF:    You develop fever (more than 100.5 F (38.1 C).     You develop a cough that does not stop easily (persistent).     You have shortness of breath.     You start wheezing.     Symptoms interfere with normal daily activities.  Document Released: 10/22/2000 Document Revised: 10/09/2010 Document Reviewed: 05/03/2008 [facility] Patient Information 2012 [place], [place].

## 2011-04-02 NOTE — Progress Notes (Signed)
  Subjective:    Patient ID: Doris Zamora, female    DOB: 10-12-1930, 78 y.o.   MRN: [id number]  HPI Mrs. Upton presents for a 10 day h/o rhinorrhea with clear mucus and dry throat with cough that is occasionally productive of a clear mucus. She denies any fever or chills, she has not had any SOB or DOE. NO ear pain.   She does c/o poor appetite. She also has had indigestion and heart burn for which he takes a [employer] gummy bear medicine for stomach as well as ginger. No diarrhea. She has lost a little weight.   I have reviewed the patient's medical history in detail and updated the computerized patient record.   Review of Systems System review is negative for any constitutional, cardiac, pulmonary, GI or neuro symptoms or complaints other than as described in the HPI.     Objective:   Physical Exam Filed Vitals:   04/02/11 1620  BP: 124/76  Pulse: 79  Temp: 97.7 F (36.5 C)  Resp: 16   Wt Readings from Last 3 Encounters:  04/02/11 210 lb 4 oz (95.369 kg)  02/25/11 218 lb (98.884 kg)  02/14/08 194 lb (87.998 kg)   Gen'l - WNWD older AA woman in no distress HEENT- no sinus tenderness,m EACs/TMs normal, throat clear Nodes - negative cervical, submandibular region Chest - CTAP Cor- RRR       Assessment & Plan:  Allergic rhinnits -  Plan - will recommend otc non-sedating antihistamine and supportive care.  Dyspepsia - recommended otc zantac 150 mg bid x 14 days, qd x 14 days then prn.

## 2011-08-07 ENCOUNTER — Other Ambulatory Visit: Payer: Self-pay

## 2011-08-08 ENCOUNTER — Telehealth: Payer: Self-pay | Admitting: *Deleted

## 2011-08-08 MED ORDER — ALPRAZOLAM 0.5 MG PO TABS: 0.5000 mg | ORAL_TABLET | Freq: Three times a day (TID) | ORAL | Status: DC | PRN

## 2011-08-08 NOTE — Telephone Encounter (Signed)
Rx called to [facility]. Refill alprazolam. Patient notified.

## 2011-10-07 ENCOUNTER — Other Ambulatory Visit: Payer: Self-pay | Admitting: Internal Medicine

## 2011-10-07 NOTE — Telephone Encounter (Signed)
Pt called and left msg in triage she has misplace her ambien needing md to call in refill on med. Is this ok to refill?...[name]

## 2011-10-08 NOTE — Telephone Encounter (Signed)
Called refill into pharmacy spoke with frank Burton/pharmacist, also notified pt...Doris Zamora

## 2011-11-18 ENCOUNTER — Ambulatory Visit (INDEPENDENT_AMBULATORY_CARE_PROVIDER_SITE_OTHER): Payer: Medicare Other | Admitting: Internal Medicine

## 2011-11-18 ENCOUNTER — Encounter: Payer: Self-pay | Admitting: Internal Medicine

## 2011-11-18 VITALS — BP 142/50 | HR 66 | Temp 97.8°F | Resp 16 | Wt 187.0 lb

## 2011-11-18 DIAGNOSIS — IMO0001 Reserved for inherently not codable concepts without codable children: Secondary | ICD-10-CM

## 2011-11-18 DIAGNOSIS — M7918 Myalgia, other site: Secondary | ICD-10-CM

## 2011-11-18 MED ORDER — ALPRAZOLAM 0.5 MG PO TABS: 0.5000 mg | ORAL_TABLET | Freq: Three times a day (TID) | ORAL | Status: DC | PRN

## 2011-11-18 MED ORDER — CYCLOBENZAPRINE HCL 5 MG PO TABS: 5.0000 mg | ORAL_TABLET | Freq: Three times a day (TID) | ORAL | Status: DC | PRN

## 2011-11-18 MED ORDER — PRAVASTATIN SODIUM 40 MG PO TABS: 40.0000 mg | ORAL_TABLET | Freq: Every day | ORAL | Status: DC

## 2011-11-18 NOTE — Patient Instructions (Addendum)
Pain in the left buttock area is most likely muscle strain. No evidence of any disk injury or hip injury.  Plan  Gentle exercise - toe touches, 1/4 squats, stretch  Rub of choice, e.g. Icy-Hot, Aspercreme, Ben-Gay, etc  Sports heat patch  DO NOT USE HEATING PAD FOR PROLONGED APPLICATION TO THE SKIN - YOU WILL GET A THERMAL BURN   Over the counter non-steroidal medication, e.g. Aleve  Flexeril 5 mg every 8 hours if needed for muscle spasm.

## 2011-11-18 NOTE — Progress Notes (Signed)
Subjective:    Patient ID: Doris Zamora, female    DOB: 09-08-1930, 78 y.o.   MRN: [id number]  HPI Doris Zamora was picking up 30-50 lb bags of cider chips and had the onset of pain at the left buttock area. She has pain when she sits, hurts with walking, hurts when supine. She has not had any radiation of pain to legs, no bowel or bladder problems. No paresthesia.  Past Medical History  Diagnosis Date  . ANXIETY 08/11/2007  . FATIGUE 08/11/2007  . HYPERLIPIDEMIA 08/11/2007  . INSOMNIA-SLEEP DISORDER-UNSPEC 08/11/2007  . WEIGHT LOSS 02/14/2008  . Endometrial cancer    Past Surgical History  Procedure Date  . Thyroidectomy, partial 1983  . Abdominal hysterectomy 2007  . Oophorectomy    Family History  Problem Relation Age of Onset  . Diabetes Mother    History   Social History  . Marital Status: Widowed    Spouse Name: N/A    Number of Children: N/A  . Years of Education: N/A   Occupational History  . Not on file.   Social History Main Topics  . Smoking status: Former Smoker -- 50 years    Types: Cigarettes  . Smokeless tobacco: Never Used  . Alcohol Use: No  . Drug Use: No     admiited to poat use of marjuana to help her sleep  . Sexually Active: Not on file   Other Topics Concern  . Not on file   Social History Narrative         Widow. 3 children - 2 living. retired [employer] operational officer    Current Outpatient Prescriptions on File Prior to Visit  Medication Sig Dispense Refill  . ALPHA-LIPOIC ACID 100 MG TABS Take by mouth daily.      Marland Kitchen ALPRAZolam (XANAX) 0.5 MG tablet Take 1 tablet (0.5 mg total) by mouth 3 (three) times daily as needed.  90 tablet  1  . CALCIUM-MAGNESUIUM-ZINC 333-133-8.3 MG TABS Take by mouth daily.      . cholecalciferol (VITAMIN D) 1000 UNITS tablet Take 1,000 Units by mouth daily.      . Chromium 200 MCG CAPS Take by mouth daily.      . Omega-3 Fatty Acids (FISH OIL) 1000 MG CPDR Take by mouth daily.      . pravastatin (PRAVACHOL) 40 MG tablet Take 40  mg by mouth daily.      Marland Kitchen zolpidem (AMBIEN) 5 MG tablet take 1 tablet by mouth at bedtime if needed for sleep  30 tablet  1      Review of Systems System review is negative for any constitutional, cardiac, pulmonary, GI or neuro symptoms or complaints other than as described in the HPI.     Objective:   Physical Exam Filed Vitals:   11/18/11 1116  BP: 142/50  Pulse: 66  Temp: 97.8 F (36.6 C)  Resp: 16   Wt Readings from Last 3 Encounters:  11/18/11 187 lb (84.823 kg)  04/02/11 210 lb 4 oz (95.369 kg)  02/25/11 218 lb (98.884 kg)   Gen'l- elderly AA woman in no distress Back exam- able to stand w/o assistance, can ambulate w/o assistance, able to step up to exam table. Nl SLR sitting and supine. No tenderness with internal or external rotation of the hips. Tender to deep palpation at the gluteus major/minor. DTRs normal       Assessment & Plan:  Pain in the left buttock area is most likely muscle strain. No evidence of any  disk injury or hip injury.  Plan  Gentle exercise - toe touches, 1/4 squats, stretch  Rub of choice, e.g. Icy-Hot, Aspercreme, Ben-Gay, etc  Sports heat patch  DO NOT USE HEATING PAD FOR PROLONGED APPLICATION TO THE SKIN - YOU WILL GET A THERMAL BURN   Over the counter non-steroidal medication, e.g. Aleve  Flexeril 5 mg every 8 hours if needed for muscle spasm.

## 2012-01-07 ENCOUNTER — Ambulatory Visit (INDEPENDENT_AMBULATORY_CARE_PROVIDER_SITE_OTHER): Payer: Medicare Other | Admitting: Internal Medicine

## 2012-01-07 ENCOUNTER — Encounter: Payer: Self-pay | Admitting: Internal Medicine

## 2012-01-07 VITALS — BP 104/68 | HR 67 | Temp 97.9°F | Resp 10 | Ht 66.0 in | Wt 186.1 lb

## 2012-01-07 DIAGNOSIS — Z0000 Encounter for general adult medical examination without abnormal findings: Secondary | ICD-10-CM

## 2012-01-07 DIAGNOSIS — E669 Obesity, unspecified: Secondary | ICD-10-CM

## 2012-01-07 DIAGNOSIS — E785 Hyperlipidemia, unspecified: Secondary | ICD-10-CM

## 2012-01-07 DIAGNOSIS — E66811 Obesity, class 1: Secondary | ICD-10-CM

## 2012-01-07 NOTE — Assessment & Plan Note (Signed)
01/07/12 - Patient was congratulated on her weight loss success and encouraged to continue healthy food choices and regular exercise.

## 2012-01-07 NOTE — Progress Notes (Signed)
Patient ID: Doris Zamora, female   DOB: 1930/05/28, 78 y.o.   MRN: [id number]  *Refill for pravastatin. Would like something for throat.  HPI Doris Zamora is a 78 yo lady who presents for her annual Medicare wellness examination and management of other chronic and acute problems.  She feels well overall. Her only complaint is that she has been bringing phlegm up for about a month. It is clear and is not associated with much coughing. She just feels it in her throat and it makes her want to cough it up. She has been using Listerine and it helps a little. She has not tried anything else. She denies fever, chills, sinus congestion, sore throat, or epigastric pain. She does report occasional nausea.    Patient sleeps 6-7 hours a night on average. She does not nap during the day.  The roster of all physicians providing medical care to patient - is listed in the Snapshot section of the chart.  Activities of daily living: The patient is 100% inedpendent in all ADLs: dressing, toileting, feeding as well as independent mobility.  Home safety: The patient has smoke detectors in the home. They wear seatbelts. No firearms at home. There is no violence in the home.   Fall safety: They did have one fall about a month ago. She was walking into [employer] and hit the curb. She put her arms out and scratched her right thumb which has not fully healed. Did not hit her head. Their home is fairly fall safe. A sturdy grab bar in the bathroom is recommended.  There is no risks for hepatitis, STDs or HIV. There is no history of blood transfusion. They have no travel history to infectious disease endemic areas of the world.  The patient has not seen their dentist in the last six month. She has all false teeth. They have not seen their eye doctor in the last year. She says that she needs to make an appointment with Dr. Nile Riggs. They deny any hearing difficulty and have not had audiologic testing in the last year. They do  not ave excessive sun exposure. Discussed the need for sun protection: hats, long sleeves and use of sunscreen if there is significant sun exposure.   Diet: the importance of a healthy diet is discussed. They do have a healthy diet. She has been making an effort to lose weight after she weighed over 200 lb at a recent office visit. She eats yogurt, oatmeal, nuts, fruit. She has not been eating as much meat or bread.   The patient has a regular exercise program: She goes to the [facility] twice a week. She also walks at the mall and [place] regularly.  The benefits of regular aerobic exercise were discussed.  Depression screen: there are no signs or vegative symptoms of depression- irritability, change in appetite, anhedonia, sadness/tearfulness.  Cognitive assessment: the patient manages all their financial and personal affairs and is actively engaged.  The following portions of the patient's history were reviewed and updated as appropriate: allergies, current medications, past family history, past medical history (endometrial cancer diagnosed in 2007; date was added to the medical history), past surgical history, past social history and problem list.  Vision, hearing, body mass index were assessed and reviewed.   During the course of the visit the patient was educated and counseled about appropriate screening and preventive services including: fall prevention, diabetes screening, nutrition counseling, colorectal cancer screening, and recommended immunizations.    Past Medical History  Diagnosis Date  . ANXIETY 08/11/2007  . FATIGUE 08/11/2007  . HYPERLIPIDEMIA 08/11/2007  . INSOMNIA-SLEEP DISORDER-UNSPEC 08/11/2007  . WEIGHT LOSS 02/14/2008  . Endometrial cancer    Past Surgical History  Procedure Date  . Thyroidectomy, partial 1983  . Abdominal hysterectomy 2007  . Oophorectomy    Family History  Problem Relation Age of Onset  . Diabetes Mother    History   Social History  . Marital  Status: Widowed    Spouse Name: N/A    Number of Children: N/A  . Years of Education: N/A   Occupational History  . Not on file.   Social History Main Topics  . Smoking status: Former Smoker -- 50 years    Types: Cigarettes  . Smokeless tobacco: Never Used  . Alcohol Use: No  . Drug Use: No     Comment: admiited to poat use of marjuana to help her sleep  . Sexually Active: Not on file   Other Topics Concern  . Not on file   Social History Narrative         Widow. 3 children - 2 living. retired [employer] operational officer   Current Outpatient Prescriptions on File Prior to Visit  Medication Sig Dispense Refill  . ALPHA-LIPOIC ACID 100 MG TABS Take by mouth daily.      Marland Kitchen ALPRAZolam (XANAX) 0.5 MG tablet Take 1 tablet (0.5 mg total) by mouth 3 (three) times daily as needed.  90 tablet  3  . CALCIUM-MAGNESUIUM-ZINC 333-133-8.3 MG TABS Take by mouth daily.      . cholecalciferol (VITAMIN D) 1000 UNITS tablet Take 1,000 Units by mouth daily.      . Chromium 200 MCG CAPS Take by mouth daily.      . cyclobenzaprine (FLEXERIL) 5 MG tablet Take 1 tablet (5 mg total) by mouth 3 (three) times daily as needed for muscle spasms.  30 tablet  1  . Omega-3 Fatty Acids (FISH OIL) 1000 MG CPDR Take by mouth daily.      . pravastatin (PRAVACHOL) 40 MG tablet Take 1 tablet (40 mg total) by mouth daily.  30 tablet  11  . zolpidem (AMBIEN) 5 MG tablet take 1 tablet by mouth at bedtime if needed for sleep  30 tablet  1   PE General: Very pleasant lady in NAD. Looks younger than chronological age. HEENT: Pupils round and reactive, ear canals clear, oral mucosa moist, oropharynx clear. No lymphadenopathy.  CV: Regular rate and rhythm, S1 and S2 present. Pulse regular. No carotid bruits. Pulm: Lungs clear to auscultation bilaterally Abdomen: Soft, nondistended, nontender. Bowel sounds present. Neuro: Alert and appropriate. No focal deficits. Reflexes 2+.  Lab Results  Component Value Date   WBC 5.6 08/18/2007   HGB  13.3 08/18/2007   HCT 39.3 08/18/2007   PLT 222 08/18/2007   GLUCOSE 99 08/11/2007   CHOL 222* 02/14/2008   TRIG 92 02/14/2008   HDL 77.8 02/14/2008   LDLDIRECT 129.4 02/14/2008   LDLCALC 119* 08/11/2007   ALT 31 08/11/2007   AST 43* 08/11/2007   NA 142 08/11/2007   K 4.2 08/11/2007   CL 106 08/11/2007   CREATININE 0.7 08/11/2007   BUN 17 08/11/2007   CO2 31 08/11/2007   TSH 0.94 08/11/2007    A/P Ms. Mccuen is a 78 yo lady who presents for her annual exam.  # Obesity - BMI today is 30.0 (5\' 6" , 186 lb) -Congratulated on weight loss success. Patient has been making smart food choices and  walking. -Encouraged continued healthy eating choices and regular exercise  # Throat phlegm - likely secondary to either allergies or GERD -May consider  # Hyperlipidemia -Last lipid panel in 2010. Patient has eaten lunch today. Will come back for labs. -Pravastatin 40 mg renewed today  # Health maintanence  -Patient had a colonoscopy approximately 4-5 years -Patient elected to stop getting mammograms 5-6 years ago -Pneumonia shot approximately 10 years ago made her sick -Patient has not had flu shot since 1968 because had the flu 3 times that year after the flu shot -Shingles shot: patient will check if covered by insurance -Recommend dental and ophthalmology appointments  # Home safety -Recommend grab bar for bathroom to prevent falls  Patient interviewed and examined, chart reviewed. I agree with the assessment and plan of Ms. Deylan Canterbury, MS III.

## 2012-01-07 NOTE — Patient Instructions (Addendum)
Thank you for coming to see [place] today. We're glad that you're doing so well. Here is a summary of what we talked about at your visit:  1. Weight - Congratulations on your weight loss! Keep up the smart food choices and regular walking. Your BMI today is 30.0. The healthy range is BMI 20-25. Your BMI earlier this year was 35 so you are halfway there. You can calculate your BMI using your height and weight if you google 'BMI calculator.'  2. Throat phlegm - Likely related to allergies. We recommend an over-the-counter anti-histamine such as Claritin.   3. Hyperlipidemia - Your last lipid panel was in 2010. We need to repeat these labs. Continue to take Pravastatin 40 mg daily.  **Come by on Friday or early next week (7:30-5:30pm, basement of this building) for lipid labs. You can eat a light breakfast and come in the early afternoon.  4. Health maintanence - Check with your health insurance company about whether they cover the shingles vaccine. We recommend that you get this vaccine to prevent another episode of shingles.  5. Home safety - We recommend that you install a sturdy grab bar in the bathroom to prevent future falls.   6. Dental and vision care - We recommend that you make appointments to see your dentist and ophthalmologist.

## 2012-01-11 DIAGNOSIS — Z0000 Encounter for general adult medical examination without abnormal findings: Secondary | ICD-10-CM | POA: Insufficient documentation

## 2012-01-11 NOTE — Assessment & Plan Note (Addendum)
Ms. Shadowens with an LDL very close to goal, and HDL that is robust and an LDL/HDL < 2 which is very protective. She hs now also lost weight. Framingham cardiac risk calculator gives a 3% chance of a cardiac event in the next 10 years based on cholesterol which is low ( low rsk is 10% or less)  Plan  She will return for followup lipid panel with recommendations to follow

## 2012-01-11 NOTE — Assessment & Plan Note (Signed)
#   Health maintanence  -Patient had a colonoscopy approximately 4-5 years -Patient elected to stop getting mammograms 5-6 years ago -Pneumonia shot approximately 10 years ago made her sick -Patient has not had flu shot since 1968 because had the flu 3 times that year after the flu shot -Shingles shot: patient will check if covered by insurance -Recommend dental and ophthalmology appointments

## 2012-01-14 ENCOUNTER — Other Ambulatory Visit: Payer: Self-pay | Admitting: Internal Medicine

## 2012-01-14 ENCOUNTER — Other Ambulatory Visit (INDEPENDENT_AMBULATORY_CARE_PROVIDER_SITE_OTHER): Payer: Medicare Other

## 2012-01-14 DIAGNOSIS — E785 Hyperlipidemia, unspecified: Secondary | ICD-10-CM

## 2012-01-14 LAB — LIPID PANEL
LDL Cholesterol: 103 mg/dL — ABNORMAL HIGH (ref 0–99)
VLDL: 14.2 mg/dL (ref 0.0–40.0)

## 2012-01-25 ENCOUNTER — Encounter: Payer: Self-pay | Admitting: Internal Medicine

## 2012-05-19 ENCOUNTER — Encounter: Payer: Self-pay | Admitting: Internal Medicine

## 2012-05-19 ENCOUNTER — Other Ambulatory Visit (INDEPENDENT_AMBULATORY_CARE_PROVIDER_SITE_OTHER): Payer: Medicare Other

## 2012-05-19 ENCOUNTER — Ambulatory Visit (INDEPENDENT_AMBULATORY_CARE_PROVIDER_SITE_OTHER): Payer: Medicare Other | Admitting: Internal Medicine

## 2012-05-19 VITALS — BP 124/70 | HR 60 | Temp 96.9°F | Resp 16 | Ht 66.0 in | Wt 188.0 lb

## 2012-05-19 DIAGNOSIS — E0789 Other specified disorders of thyroid: Secondary | ICD-10-CM

## 2012-05-19 DIAGNOSIS — E039 Hypothyroidism, unspecified: Secondary | ICD-10-CM

## 2012-05-19 DIAGNOSIS — R1310 Dysphagia, unspecified: Secondary | ICD-10-CM

## 2012-05-19 DIAGNOSIS — E890 Postprocedural hypothyroidism: Secondary | ICD-10-CM

## 2012-05-19 DIAGNOSIS — R1319 Other dysphagia: Secondary | ICD-10-CM

## 2012-05-19 NOTE — Patient Instructions (Addendum)
Swallow discomfort - history of thyroid surgery. Question if there is recurrent thyroid tissue. Also need to check on thyroid function.  Plan Ultrasound of the neck at [facility] - you will be called with appointment  Lab work to check thyroid function.

## 2012-05-19 NOTE — Progress Notes (Signed)
  Subjective:    Patient ID: Doris Zamora, female    DOB: 1930/09/19, 79 y.o.   MRN: [id number]  HPI Doris Zamora presents for evaluation for sore throat, heavy clear phlegm and trouble swallowing with discomfort in the region of the pharynx. She denies frank dysphagia; has had no regurgitation. No fever. No reported weight loss. She can have night sweats - she associates this with heavy bed clothes.   PMH, FamHx and SocHx reviewed for any changes and relevance. Current Outpatient Prescriptions on File Prior to Visit  Medication Sig Dispense Refill  . ALPHA-LIPOIC ACID 100 MG TABS Take by mouth daily.      Marland Kitchen ALPRAZolam (XANAX) 0.5 MG tablet Take 1 tablet (0.5 mg total) by mouth 3 (three) times daily as needed.  90 tablet  3  . CALCIUM-MAGNESUIUM-ZINC 333-133-8.3 MG TABS Take by mouth daily.      . cholecalciferol (VITAMIN D) 1000 UNITS tablet Take 1,000 Units by mouth daily.      . Chromium 200 MCG CAPS Take by mouth daily.      Marland Kitchen co-enzyme Q-10 30 MG capsule Take 100 mg by mouth 2 (two) times daily.       . cyclobenzaprine (FLEXERIL) 5 MG tablet Take 1 tablet (5 mg total) by mouth 3 (three) times daily as needed for muscle spasms.  30 tablet  1  . Omega-3 Fatty Acids (FISH OIL) 1000 MG CPDR Take by mouth daily.      Marland Kitchen zolpidem (AMBIEN) 5 MG tablet take 1 tablet by mouth at bedtime if needed for sleep  30 tablet  1   No current facility-administered medications on file prior to visit.      Review of Systems System review is negative for any constitutional, cardiac, pulmonary, GI or neuro symptoms or complaints other than as described in the HPI.     Objective:   Physical Exam Filed Vitals:   05/19/12 1521  BP: 124/70  Pulse: 60  Temp: 96.9 F (36.1 C)  Resp: 16   Gen'l - WNWD woman in no distress HEENT - No sinus tenderness to percussion, TM's normal, Throat without exudate Neck - well healed anterior scar. No palpable thyroid gland or mass Cor- RRR PUlm - normal  respirations. Neuro - A&O x 3  Lab Results  Component Value Date   TSH 1.31 05/19/2012       FT4                         0.59      Assessment & Plan:  Swallow discomfort - history of thyroid surgery. Question if there is recurrent thyroid tissue. Also need to check on thyroid function.  Plan Ultrasound of the neck at [facility] - you will be called with appointment  Lab work to check thyroid function.  Addendum: thyroid function normal

## 2012-05-24 ENCOUNTER — Encounter: Payer: Self-pay | Admitting: Internal Medicine

## 2012-05-25 ENCOUNTER — Ambulatory Visit
Admission: RE | Admit: 2012-05-25 | Discharge: 2012-05-25 | Disposition: A | Discharge status: Home / Self Care | Payer: Medicare Other | Source: Ambulatory Visit | Attending: Internal Medicine | Admitting: Internal Medicine

## 2012-05-25 DIAGNOSIS — R1319 Other dysphagia: Secondary | ICD-10-CM

## 2012-06-07 ENCOUNTER — Telehealth: Payer: Self-pay

## 2012-06-07 NOTE — Telephone Encounter (Signed)
Message copied by Noreene Larsson on Mon Jun 07, 2012  5:11 PM ------      Message from: Illene Regulus E      Created: Mon Jun 07, 2012  3:06 PM       Please call patient - u/s neck reveals unremarkable left thyroid lobe. There were several small lymph nodes - for any problems with fever, sweats, or weight loss will need OV. ------

## 2012-06-07 NOTE — Telephone Encounter (Signed)
L/m for pt to return call

## 2012-06-08 ENCOUNTER — Telehealth: Payer: Self-pay

## 2012-06-08 NOTE — Telephone Encounter (Signed)
Message copied by Noreene Larsson on Tue Jun 08, 2012  8:07 AM ------      Message from: Illene Regulus E      Created: Mon Jun 07, 2012  3:06 PM       Please call patient - u/s neck reveals unremarkable left thyroid lobe. There were several small lymph nodes - for any problems with fever, sweats, or weight loss will need OV. ------

## 2012-06-08 NOTE — Telephone Encounter (Signed)
Pt notified an was transferred to the front desk to schedule an appt

## 2012-06-11 ENCOUNTER — Other Ambulatory Visit (INDEPENDENT_AMBULATORY_CARE_PROVIDER_SITE_OTHER): Payer: Medicare Other

## 2012-06-11 ENCOUNTER — Encounter: Payer: Self-pay | Admitting: Internal Medicine

## 2012-06-11 ENCOUNTER — Ambulatory Visit (INDEPENDENT_AMBULATORY_CARE_PROVIDER_SITE_OTHER): Payer: Medicare Other | Admitting: Internal Medicine

## 2012-06-11 VITALS — BP 130/62 | HR 61 | Temp 98.0°F | Wt 189.1 lb

## 2012-06-11 DIAGNOSIS — R590 Localized enlarged lymph nodes: Secondary | ICD-10-CM

## 2012-06-11 DIAGNOSIS — R9389 Abnormal findings on diagnostic imaging of other specified body structures: Secondary | ICD-10-CM

## 2012-06-11 DIAGNOSIS — R61 Generalized hyperhidrosis: Secondary | ICD-10-CM

## 2012-06-11 DIAGNOSIS — R599 Enlarged lymph nodes, unspecified: Secondary | ICD-10-CM

## 2012-06-11 LAB — CBC WITH DIFFERENTIAL/PLATELET
Eosinophils Relative: 0.5 % (ref 0.0–5.0)
HCT: 39 % (ref 36.0–46.0)
Hemoglobin: 13.4 g/dL (ref 12.0–15.0)
Lymphs Abs: 1.5 10*3/uL (ref 0.7–4.0)
MCV: 90.9 fl (ref 78.0–100.0)
Monocytes Absolute: 0.6 10*3/uL (ref 0.1–1.0)
Neutro Abs: 4.4 10*3/uL (ref 1.4–7.7)
Platelets: 199 10*3/uL (ref 150.0–400.0)
WBC: 6.5 10*3/uL (ref 4.5–10.5)

## 2012-06-11 LAB — LACTATE DEHYDROGENASE: LDH: 195 U/L (ref 94–250)

## 2012-06-11 NOTE — Patient Instructions (Signed)
It was good to see you today. We have reviewed your prior records including labs and tests today Test(s) ordered today. Your results will be released to MyChart (or called to you) after review, usually within 72hours after test completion. If any changes need to be made, you will be notified at that same time. we'll make referral for CT scan of chest, abdomen and pelvis to evaluate for other abnormal lymph nodes. Our office will contact you regarding appointment(s) once made. followup with Dr. Debby Bud in 2-4 weeks to review results and symptoms, call sooner if worse

## 2012-06-11 NOTE — Progress Notes (Signed)
Subjective:    Patient ID: Doris Zamora, female    DOB: 1930/06/26, 79 y.o.   MRN: [id number]  HPI  Here for follow up - abnormal soft tissue ultrasound with LAD associated with night sweats, mild See PA student note  Past Medical History  Diagnosis Date  . ANXIETY   . HYPERLIPIDEMIA   . INSOMNIA-SLEEP DISORDER-UNSPEC   . WEIGHT LOSS   . Endometrial cancer 2007    Review of Systems  Constitutional: Positive for diaphoresis and fatigue. Negative for fever, activity change, appetite change and unexpected weight change.  Respiratory: Negative for cough and shortness of breath.   Cardiovascular: Negative for chest pain and leg swelling.       Objective:   Physical Exam BP 130/62  Pulse 61  Temp(Src) 98 F (36.7 C) (Oral)  Wt 189 lb 1.9 oz (85.784 kg)  BMI 30.54 kg/m2  SpO2 98% Wt Readings from Last 3 Encounters:  06/11/12 189 lb 1.9 oz (85.784 kg)  05/19/12 188 lb (85.276 kg)  01/07/12 186 lb 1.9 oz (84.423 kg)   Constitutional: She appears well-developed and well-nourished. No distress.   Neck: mild L side LAD - Normal range of motion. Neck supple. No JVD present. No thyromegaly present.  Cardiovascular: Normal rate, regular rhythm and normal heart sounds.  No murmur heard. No BLE edema. Pulmonary/Chest: Effort normal and breath sounds normal. No respiratory distress. She has no wheezes.  Abdominal: Soft. Bowel sounds are normal. She exhibits no distension. There is no tenderness. no masses. No appreciable LAD on groin palp Skin: Skin is warm and dry. No rash noted. No erythema.  Psychiatric: She has a normal mood and affect. Her behavior is normal. Judgment and thought content normal.   Lab Results  Component Value Date   WBC 5.6 08/18/2007   HGB 13.3 08/18/2007   HCT 39.3 08/18/2007   PLT 222 08/18/2007   GLUCOSE 99 08/11/2007   CHOL 183 01/14/2012   TRIG 71.0 01/14/2012   HDL 65.60 01/14/2012   LDLDIRECT 129.4 02/14/2008   LDLCALC 103* 01/14/2012   ALT 31 08/11/2007   AST  43* 08/11/2007   NA 142 08/11/2007   K 4.2 08/11/2007   CL 106 08/11/2007   CREATININE 0.7 08/11/2007   BUN 17 08/11/2007   CO2 31 08/11/2007   TSH 1.31 05/19/2012   US Soft Tissue Head/neck  05/25/2012  *RADIOLOGY REPORT*  Clinical Data: Swallowing difficulty, dysphagia and history of thyroidectomy.  THYROID ULTRASOUND  Technique: Ultrasound examination of the thyroid gland and adjacent soft tissues was performed.  Comparison:  None.  Findings:  Right thyroid lobe:  Surgically absent. Left thyroid lobe:  4.3 x 2.0 x 1.5 cm Isthmus:  0.3 cm  Focal nodules:  No soft tissue abnormalities are identified in the right thyroid bed after prior thyroidectomy.  The left lobe of the thyroid shows no focal nodules.  Lymphadenopathy:  Some small probable left-sided cervical lymph nodes are identified with the largest measuring only 10 mm in diameter.  Some of these presumed lymph nodes show relatively hypoechoic central areas compared to the outer regions, suggesting potential central necrosis.  This is of unclear significance.  IMPRESSION: Unremarkable left lobe of the thyroid gland after prior right thyroidectomy.  Some atypical appearing small left cervical lymph nodes are noted by ultrasound of unclear significance.  Correlation suggested clinically.   Original Report Authenticated By: Irish Lack, M.D.      Assessment & Plan:   Night sweats, mild LAD, abnormal cervical  findings on neck soft tissue ultrasound, reviewed  Check labs Check ct c/a/p with CM r/o other LAD - consider bx if LAD comfirmed

## 2012-06-11 NOTE — Progress Notes (Signed)
Subjective:    Patient ID: Doris Zamora, female    DOB: 1930-10-04, 79 y.o.   MRN: [id number]  HPI  Patient here for f/u for night sweats.  Patient says that for the past couple of months she has been experiencing night sweats.  She says that she wakes up sometimes and feels like she is uncomfortable because she is very warm.  She states that her bed clothes are not soaked with sweat and she sweats very little.      Pharyngitis:  Patient feels like her sore throat is better.  She originally related this pain as similar to the pain prior to her right thyroid lobectomy in 1984.  [place] of thyroid performed 06/07/12 found no regrowth of thyroid, but did find a 10 mm lymph node with questionable suggestion of central necrosis.  She has been gargling water and peroxide.  She states the pain has decreased.  She denies frank dysphagia, difficulty swallowing, and choking.  Past Medical History  Diagnosis Date  . ANXIETY 08/11/2007  . FATIGUE 08/11/2007  . HYPERLIPIDEMIA 08/11/2007  . INSOMNIA-SLEEP DISORDER-UNSPEC 08/11/2007  . WEIGHT LOSS 02/14/2008  . Endometrial cancer 2007   Family History  Problem Relation Age of Onset  . Diabetes Mother     Review of Systems  Constitutional: Positive for diaphoresis and fatigue. Negative for fever, chills and unexpected weight change.  HENT: Positive for sore throat. Negative for facial swelling and neck pain.   Respiratory: Negative for cough, choking, chest tightness, shortness of breath and wheezing.   Cardiovascular: Negative for chest pain.  Gastrointestinal: Negative for nausea, vomiting, abdominal pain, diarrhea and constipation.  Neurological: Negative for weakness and headaches.  All other systems reviewed and are negative.       Objective:   Physical Exam  Nursing note and vitals reviewed. Constitutional: She is oriented to person, place, and time. She appears well-developed and well-nourished.  HENT:  Head: Normocephalic and atraumatic.   Mouth/Throat: Oropharynx is clear and moist. No posterior oropharyngeal erythema.  Eyes: Conjunctivae are normal. Pupils are equal, round, and reactive to light. No scleral icterus.  Evidence of early cataract formation in the left eye  Neck: Normal range of motion. Neck supple. No thyromegaly present.  Palpable 0.5 - 1 cm lymph node in the L posterior cervical chain.  Cardiovascular: Normal rate, regular rhythm and normal heart sounds.  Exam reveals no gallop and no friction rub.   No murmur heard. Pulmonary/Chest: Effort normal and breath sounds normal. No respiratory distress. She has no wheezes. She has no rales. She exhibits no tenderness.  Lymphadenopathy:    She has cervical adenopathy.  Neurological: She is alert and oriented to person, place, and time. No cranial nerve deficit. Coordination normal.  Skin: Skin is warm and dry.  Psychiatric: She has a normal mood and affect. Her behavior is normal.   Wt Readings from Last 3 Encounters:  06/11/12 189 lb 1.9 oz (85.784 kg)  05/19/12 188 lb (85.276 kg)  01/07/12 186 lb 1.9 oz (84.423 kg)   Filed Vitals:   06/11/12 1009  BP: 130/62  Pulse: 61  Temp: 98 F (36.7 C)  TempSrc: Oral  Weight: 189 lb 1.9 oz (85.784 kg)  SpO2: 98%         Assessment & Plan:  Patient here for f/u of thyroid ultrasound and night sweats  Based on palpable lymph node in the cervical chain found on both physical exam and [place], we will check CBC, CMP, UA, LDH,  and CT scan of chest, abdomen, and pelvis with contrast to rule out other lymphadenopathy.  Consider doing biopsy of node if CT and labwork are normal.  Follow-up with Dr. Arthur Holms in 2-4 weeks to discuss results.  Call if symptoms worse.       I have personally reviewed this case with PA student. I also personally examined this patient. I agree with history and findings as documented above. I reviewed, discussed and approve of the assessment and plan as listed above. Rene Paci, MD

## 2012-06-14 LAB — HEPATIC FUNCTION PANEL
ALT: 42 U/L — ABNORMAL HIGH (ref 0–35)
AST: 49 U/L — ABNORMAL HIGH (ref 0–37)
Albumin: 3.7 g/dL (ref 3.5–5.2)
Total Bilirubin: 0.6 mg/dL (ref 0.3–1.2)
Total Protein: 7.9 g/dL (ref 6.0–8.3)

## 2012-06-14 LAB — BASIC METABOLIC PANEL
Calcium: 9 mg/dL (ref 8.4–10.5)
GFR: 92.75 mL/min (ref >60.00)
Potassium: 4.2 mEq/L (ref 3.5–5.1)
Sodium: 137 mEq/L (ref 135–145)

## 2012-06-17 ENCOUNTER — Encounter: Payer: Self-pay | Admitting: Internal Medicine

## 2012-06-17 ENCOUNTER — Ambulatory Visit (INDEPENDENT_AMBULATORY_CARE_PROVIDER_SITE_OTHER)
Admission: RE | Admit: 2012-06-17 | Discharge: 2012-06-17 | Disposition: A | Discharge status: Home / Self Care | Payer: Medicare Other | Source: Ambulatory Visit | Attending: Internal Medicine | Admitting: Internal Medicine

## 2012-06-17 DIAGNOSIS — R590 Localized enlarged lymph nodes: Secondary | ICD-10-CM

## 2012-06-17 DIAGNOSIS — R599 Enlarged lymph nodes, unspecified: Secondary | ICD-10-CM

## 2012-06-17 DIAGNOSIS — R9389 Abnormal findings on diagnostic imaging of other specified body structures: Secondary | ICD-10-CM

## 2012-06-17 DIAGNOSIS — R61 Generalized hyperhidrosis: Secondary | ICD-10-CM

## 2012-06-17 MED ORDER — IOHEXOL 300 MG/ML  SOLN
100.0000 mL | Freq: Once | INTRAMUSCULAR | Status: AC | PRN
  Administered 2012-06-17: 100 mL via INTRAVENOUS

## 2012-07-12 ENCOUNTER — Ambulatory Visit (INDEPENDENT_AMBULATORY_CARE_PROVIDER_SITE_OTHER): Payer: Medicare Other | Admitting: Internal Medicine

## 2012-07-12 ENCOUNTER — Encounter: Payer: Self-pay | Admitting: Internal Medicine

## 2012-07-12 VITALS — BP 136/68 | HR 65 | Temp 97.4°F | Wt 186.8 lb

## 2012-07-12 DIAGNOSIS — D3500 Benign neoplasm of unspecified adrenal gland: Secondary | ICD-10-CM

## 2012-07-12 DIAGNOSIS — R911 Solitary pulmonary nodule: Secondary | ICD-10-CM

## 2012-07-12 DIAGNOSIS — D136 Benign neoplasm of pancreas: Secondary | ICD-10-CM

## 2012-07-12 NOTE — Progress Notes (Signed)
  Subjective:    Patient ID: Doris Zamora, female    DOB: 1930-02-19, 79 y.o.   MRN: [id number]  HPI Ms. Disbro was seen in April for swallow trouble. She had U/S soft tissue of the neck which revealed normal thyroid and non-specific lymph nodes. She saw Dr. Felicity Coyer in early May for continued trouble and night sweats. She had CT chest and abd/pelvis. Swallow is better. She is only rarely having night sweats. She has not lost weight. She feels pretty good.  PMH, FamHx and SocHx reviewed for any changes and relevance. Current Outpatient Prescriptions on File Prior to Visit  Medication Sig Dispense Refill  . ALPHA-LIPOIC ACID 100 MG TABS Take by mouth daily.      Marland Kitchen ALPRAZolam (XANAX) 0.5 MG tablet Take 1 tablet (0.5 mg total) by mouth 3 (three) times daily as needed.  90 tablet  3  . CALCIUM-MAGNESUIUM-ZINC 333-133-8.3 MG TABS Take by mouth daily.      . cholecalciferol (VITAMIN D) 1000 UNITS tablet Take 1,000 Units by mouth daily.      . Chromium 200 MCG CAPS Take by mouth daily.      Marland Kitchen co-enzyme Q-10 30 MG capsule Take 100 mg by mouth 2 (two) times daily.       . cyclobenzaprine (FLEXERIL) 5 MG tablet Take 1 tablet (5 mg total) by mouth 3 (three) times daily as needed for muscle spasms.  30 tablet  1  . Omega-3 Fatty Acids (FISH OIL) 1000 MG CPDR Take by mouth daily.      Marland Kitchen zolpidem (AMBIEN) 5 MG tablet take 1 tablet by mouth at bedtime if needed for sleep  30 tablet  1   No current facility-administered medications on file prior to visit.      Review of Systems System review is negative for any constitutional, cardiac, pulmonary, GI or neuro symptoms or complaints other than as described in the HPI.     Objective:   Physical Exam Filed Vitals:   07/12/12 1513  BP: 136/68  Pulse: 65  Temp: 97.4 F (36.3 C)   Wt Readings from Last 3 Encounters:  07/12/12 186 lb 12.8 oz (84.732 kg)  06/11/12 189 lb 1.9 oz (85.784 kg)  05/19/12 188 lb (85.276 kg)   Gen'l WNWD AA woman in no  distress HEENT C&S clear, PERRLA Neck- supple, no thyromegaly Nodes - negative cervical, supraclavicular Cor- 2+ radial RRR Pulm - normal  Neuor - normal       Assessment & Plan:  1. Swallow trouble - I am glad that this is better.  2. Night sweats - sounds like this has resolved.  3. CT scans revealed: 6mm (1/4 inch) nodule in the left lower lung; 1.8 cm ( 3/4 inch) nodule in the pancrease; small bilateral nodules in the adrenal glands. None of this look particularly worrisome. The recommendation is for follow up CT scans in Nov or Dec to see if there is any growth in size of these nodules that might be worrisome

## 2012-07-12 NOTE — Patient Instructions (Addendum)
1. Swallow trouble - I am glad that this is better.  2. Night sweats - sounds like this has resolved.  3. CT scans revealed: 6mm (1/4 inch) nodule in the left lower lung; 1.8 cm ( 3/4 inch) nodule in the pancrease; small bilateral nodules in the adrenal glands. None of this look particularly worrisome. The recommendation is for follow up CT scans in Nov or Dec to see if there is any growth in size of these nodules that might be worrisome

## 2012-07-13 ENCOUNTER — Telehealth: Payer: Self-pay

## 2012-07-13 ENCOUNTER — Other Ambulatory Visit: Payer: Self-pay | Admitting: Internal Medicine

## 2012-07-13 DIAGNOSIS — K862 Cyst of pancreas: Secondary | ICD-10-CM

## 2012-07-13 DIAGNOSIS — D3500 Benign neoplasm of unspecified adrenal gland: Secondary | ICD-10-CM

## 2012-07-13 NOTE — Telephone Encounter (Signed)
CT chest w/o contrast as ordered Cancel CT of abd/pelvis MRI abd/pelvis as ordered. All studies are for November

## 2012-07-13 NOTE — Telephone Encounter (Signed)
Phone call from [facility] with [address] (ext [phone]) states you ordered a CT w/o contrast but your note states CT with contrast. Also radiologist recommended from last CT to have an MRI. Just need clarification so they can schedule patient accordingly. thanks

## 2012-07-13 NOTE — Telephone Encounter (Signed)
Lm for [name] with these orders

## 2012-07-13 NOTE — Telephone Encounter (Signed)
1. CT chest w/o contrast to follow up pulmonary nodule 2. OK : for pancreatic cyst will but in an order for MRI, can CT abd/pelvis

## 2012-07-13 NOTE — Telephone Encounter (Signed)
[name] with CT called again. Pre-cert states your notes says to cancel CT abd/pelvis. Is this correct? [name] needs orders verified again of what patient is to have done.

## 2012-07-14 NOTE — Telephone Encounter (Signed)
Spoke with Misty Stanley and she will relay the message to [place].

## 2012-10-07 ENCOUNTER — Ambulatory Visit (INDEPENDENT_AMBULATORY_CARE_PROVIDER_SITE_OTHER)
Admission: RE | Admit: 2012-10-07 | Discharge: 2012-10-07 | Disposition: A | Discharge status: Home / Self Care | Payer: Medicare Other | Source: Ambulatory Visit | Attending: Internal Medicine | Admitting: Internal Medicine

## 2012-10-07 ENCOUNTER — Encounter: Payer: Self-pay | Admitting: Internal Medicine

## 2012-10-07 ENCOUNTER — Ambulatory Visit (INDEPENDENT_AMBULATORY_CARE_PROVIDER_SITE_OTHER): Payer: Medicare Other | Admitting: Internal Medicine

## 2012-10-07 VITALS — BP 122/68 | HR 68 | Temp 98.3°F | Wt 182.4 lb

## 2012-10-07 DIAGNOSIS — R05 Cough: Secondary | ICD-10-CM

## 2012-10-07 NOTE — Patient Instructions (Addendum)
Cough without signs or symptoms of an infection. CT chest in May was clear. Exam today with clear lungs, no sinus tenderness, good oxygen saturation. This is mucus production most likely related to some type of allergy.  Plan - over the counter Allegra (generic is fine) 180 mg once a day  Mucinex DM 1200 mg strength (or 2 x 600 mg )  Twice a day  Hydrate well  Will check a chest x-ray today to be sure the lungs are as clear as they sound.  Allergic Rhinitis Allergic rhinitis is when the mucous membranes in the nose respond to allergens. Allergens are particles in the air that cause your body to have an allergic reaction. This causes you to release allergic antibodies. Through a chain of events, these eventually cause you to release histamine into the blood stream (hence the use of antihistamines). Although meant to be protective to the body, it is this release that causes your discomfort, such as frequent sneezing, congestion and an itchy runny nose.  CAUSES  The pollen allergens may come from grasses, trees, and weeds. This is seasonal allergic rhinitis, or "hay fever." Other allergens cause year-round allergic rhinitis (perennial allergic rhinitis) such as house dust mite allergen, pet dander and mold spores.  SYMPTOMS   Nasal stuffiness (congestion).  Runny, itchy nose with sneezing and tearing of the eyes.  There is often an itching of the mouth, eyes and ears. It cannot be cured, but it can be controlled with medications. DIAGNOSIS  If you are unable to determine the offending allergen, skin or blood testing may find it. TREATMENT   Avoid the allergen.  Medications and allergy shots (immunotherapy) can help.  Hay fever may often be treated with antihistamines in pill or nasal spray forms. Antihistamines block the effects of histamine. There are over-the-counter medicines that may help with nasal congestion and swelling around the eyes. Check with your caregiver before taking or giving  this medicine. If the treatment above does not work, there are many new medications your caregiver can prescribe. Stronger medications may be used if initial measures are ineffective. Desensitizing injections can be used if medications and avoidance fails. Desensitization is when a patient is given ongoing shots until the body becomes less sensitive to the allergen. Make sure you follow up with your caregiver if problems continue. SEEK MEDICAL CARE IF:   You develop fever (more than 100.5 F (38.1 C).  You develop a cough that does not stop easily (persistent).  You have shortness of breath.  You start wheezing.  Symptoms interfere with normal daily activities. Document Released: 10/22/2000 Document Revised: 04/21/2011 Document Reviewed: 05/03/2008 [facility] Patient Information 2014 [place], [place].

## 2012-10-07 NOTE — Progress Notes (Signed)
Subjective:    Patient ID: Doris Zamora, female    DOB: [birth year], 79 y.o.   MRN: [id number]  HPI Doris Zamora presents with a persistent cough and mucus for 6 weeks. No fever, no chills. Sputum is thick either clear or white. No purulent sputum. No SOB. No N/V.  No pressure or pain in her sinus. CT chest May '14 unremarkable except for 6 mm nodule LLL with recommendation for f/u 12 months.   She does ask about thyroid function - was not able to tolerate synthroid- had facial swelling. Last FT4 April '14 = 0.59 (0.6-1.6)  Past Medical History  Diagnosis Date  . ANXIETY   . HYPERLIPIDEMIA   . INSOMNIA-SLEEP DISORDER-UNSPEC   . WEIGHT LOSS   . Endometrial cancer 2007   Past Surgical History  Procedure Laterality Date  . Thyroidectomy, partial  1983  . Abdominal hysterectomy  2007  . Oophorectomy     Family History  Problem Relation Age of Onset  . Diabetes Mother    History   Social History  . Marital Status: Widowed    Spouse Name: N/A    Number of Children: N/A  . Years of Education: N/A   Occupational History  . Not on file.   Social History Main Topics  . Smoking status: Former Smoker -- 50 years    Types: Cigarettes  . Smokeless tobacco: Never Used  . Alcohol Use: No  . Drug Use: No     Comment: admiited to poat use of marjuana to help her sleep  . Sexual Activity: Not on file   Other Topics Concern  . Not on file   Social History Narrative         Widow. 3 children - 2 living. retired [employer] operational officer    Current Outpatient Prescriptions on File Prior to Visit  Medication Sig Dispense Refill  . ALPHA-LIPOIC ACID 100 MG TABS Take by mouth daily.      Marland Kitchen ALPRAZolam (XANAX) 0.5 MG tablet Take 1 tablet (0.5 mg total) by mouth 3 (three) times daily as needed.  90 tablet  3  . CALCIUM-MAGNESUIUM-ZINC 333-133-8.3 MG TABS Take by mouth daily.      . cholecalciferol (VITAMIN D) 1000 UNITS tablet Take 1,000 Units by mouth daily.      Marland Kitchen co-enzyme Q-10 30 MG capsule Take  100 mg by mouth 2 (two) times daily.       . cyclobenzaprine (FLEXERIL) 5 MG tablet Take 1 tablet (5 mg total) by mouth 3 (three) times daily as needed for muscle spasms.  30 tablet  1  . Omega-3 Fatty Acids (FISH OIL) 1000 MG CPDR Take by mouth daily.      Marland Kitchen zolpidem (AMBIEN) 5 MG tablet take 1 tablet by mouth at bedtime if needed for sleep  30 tablet  1   No current facility-administered medications on file prior to visit.     Review of Systems System review is negative for any constitutional, cardiac, pulmonary, GI or neuro symptoms or complaints other than as described in the HPI.     Objective:   Physical Exam Filed Vitals:   10/07/12 1101  BP: 122/68  Pulse: 68  Temp: 98.3 F (36.8 C)   Wt Readings from Last 3 Encounters:  10/07/12 182 lb 6.4 oz (82.736 kg)  07/12/12 186 lb 12.8 oz (84.732 kg)  06/11/12 189 lb 1.9 oz (85.784 kg)   BP Readings from Last 3 Encounters:  10/07/12 122/68  07/12/12 136/68  06/11/12 130/62  Gen'l - Elderly AA woman in no distress, a little hoarse.  HEENT- throat clear, no sinus tenderness to percussion Nodes - negative submandibular, cervical, supraclavicular Cor 2+ radial pulse (B), RRR Pulm - good breath sounds, no rales or wheezes, no increased WOB Neuro - A&O x 3  CXR 2 view:CHEST - 2 VIEW  Comparison: 02/14/2008  Findings: Cardiomediastinal silhouette is stable. No acute  infiltrate or pleural effusion. No pulmonary edema. Stable  degenerative changes thoracic spine.  IMPRESSION:  At no active disease. No significant change.      Assessment & Plan:  Cough without signs or symptoms of an infection. CT chest in May was clear. Exam today with clear lungs, no sinus tenderness, good oxygen saturation. This is mucus production most likely related to some type of allergy.  Plan - over the counter Allegra (generic is fine) 180 mg once a day  Mucinex DM 1200 mg strength (or 2 x 600 mg )  Twice a day  Hydrate well  Will check a chest  x-ray today to be sure the lungs are as clear as they sound.

## 2012-11-11 ENCOUNTER — Ambulatory Visit (INDEPENDENT_AMBULATORY_CARE_PROVIDER_SITE_OTHER): Payer: Medicare Other | Admitting: Internal Medicine

## 2012-11-11 ENCOUNTER — Encounter: Payer: Self-pay | Admitting: Internal Medicine

## 2012-11-11 VITALS — BP 126/64 | HR 63 | Temp 98.5°F | Wt 182.4 lb

## 2012-11-11 DIAGNOSIS — R197 Diarrhea, unspecified: Secondary | ICD-10-CM | POA: Insufficient documentation

## 2012-11-11 DIAGNOSIS — Z0000 Encounter for general adult medical examination without abnormal findings: Secondary | ICD-10-CM

## 2012-11-11 MED ORDER — ZOLPIDEM TARTRATE 5 MG PO TABS: ORAL_TABLET | ORAL | Status: DC

## 2012-11-11 NOTE — Assessment & Plan Note (Addendum)
Given the self-limited course for her loose stools and that they have already resolved with no evidence of tenderness on physical exam, reassurance and advise to resume normal diet is appropriate. If symptoms resume or worsen, return for follow-up.

## 2012-11-11 NOTE — Progress Notes (Signed)
Subjective:     Patient ID: Doris Zamora, female   DOB: August 31, 1930, 79 y.o.   MRN: [id number]  HPI Doris Zamora is an 79 year old woman with a history of hyperlipidemia, obesity, fatigue, insomnia and anxiety who presents today with a 4 day history of loose stools which have resolved. About 6 days ago, she began having one non-bloody, non-mucousy loose stool each day for about 4 days. She experienced some urgency, but was able to reach the restroom in time for each episode of loose stool. She reports an unusually malodorous odor to the stools. She can't recall any precipitating factors for the loose stools, but does report eating an usual amount of muscadine grapes on the day they started. After about 2 days of the loose stools, she also ate some oysters, and she wonders if they had "gone bad." The loose stools resolved about 2 days ago, but she is continuing a lighter diet of toast, applesauce, rice, and rice-water in an effort to prevent more diarrhea. She also admits to some sneezing, fatigue and some loss of appetite. She denies any fever, chills/sweats, nausea, vomiting, myalgias, or arthalgias. She recalls feeling dehydrated, but never experienced any HA, dizziness, or changes in vision. She continues to feel somewhat fatigued today.    Past Medical History  Diagnosis Date  . ANXIETY   . HYPERLIPIDEMIA   . INSOMNIA-SLEEP DISORDER-UNSPEC   . WEIGHT LOSS   . Endometrial cancer 2007   Past Surgical History  Procedure Laterality Date  . Thyroidectomy, partial  1983  . Abdominal hysterectomy  2007  . Oophorectomy     Family History  Problem Relation Age of Onset  . Diabetes Mother    History   Social History  . Marital Status: Widowed    Spouse Name: N/A    Number of Children: N/A  . Years of Education: N/A   Occupational History  . Not on file.   Social History Main Topics  . Smoking status: Former Smoker -- 50 years    Types: Cigarettes  . Smokeless tobacco: Never Used  .  Alcohol Use: No  . Drug Use: No     Comment: admiited to poat use of marjuana to help her sleep  . Sexual Activity: Not on file   Other Topics Concern  . Not on file   Social History Narrative         Widow. 3 children - 2 living. retired [employer] operational officer     Review of Systems HEENT: No sinus pressure or congestion.  Abdominal: No constipation  MSK: No myalgias or arthralgias outside of baseline left shoulder soreness.     Objective:   Physical Exam Filed Vitals:   11/11/12 1609  BP: 126/64  Pulse: 63  Temp: 98.5 F (36.9 C)   General: Overweight black woman sitting in no acute distress in the exam room.  Abdominal: Normal active bowel sounds. No tenderness to palpation. No masses detected.  CV: Regular rate and rhythm, no murmurs or rubs.  Pulm: Lungs clear to auscultation bilaterally.  Neck: No LAD.     Current outpatient prescriptions:ALPHA-LIPOIC ACID 100 MG TABS, Take by mouth daily., Disp: , Rfl: ;  ALPRAZolam (XANAX) 0.5 MG tablet, Take 1 tablet (0.5 mg total) by mouth 3 (three) times daily as needed., Disp: 90 tablet, Rfl: 3;  CALCIUM-MAGNESUIUM-ZINC 333-133-8.3 MG TABS, Take by mouth daily., Disp: , Rfl: ;  cholecalciferol (VITAMIN D) 1000 UNITS tablet, Take 1,000 Units by mouth daily., Disp: , Rfl:  co-enzyme Q-10 30 MG capsule, Take 100 mg by mouth 2 (two) times daily. , Disp: , Rfl: ;  cyclobenzaprine (FLEXERIL) 5 MG tablet, Take 1 tablet (5 mg total) by mouth 3 (three) times daily as needed for muscle spasms., Disp: 30 tablet, Rfl: 1;  Omega-3 Fatty Acids (FISH OIL) 1000 MG CPDR, Take by mouth daily., Disp: , Rfl: ;  zolpidem (AMBIEN) 5 MG tablet, take 1 tablet by mouth at bedtime if needed for sleep, Disp: 30 tablet, Rfl: 1  Assessment:     Doris Zamora is an 79 year old woman with a history of hyperlipidemia, obesity, anxiety and insomnia, who presents after a 4 day period of non-bloody, non-mucousy loose stools daily and continuing fatigue. Her presentation of 4 days of  loose stools with urgency which has resolved is most consistent with a self-limited viral gastroenteritis, food poisoning, or food-related intestinal irritation. The isolated diarrhea with no vomiting is more consistent with food poisoning; however, the course of 4 days is longer than typically seen with food poisoning, so food-related intestinal irritation is most likely.      Plan:     See plan by problem list.

## 2012-11-11 NOTE — Patient Instructions (Addendum)
Good to see you.  You probably had grape related belly ache and loose stool. You seem fine now  OK to advance your diet: cooked vegetables, potatoes, lean meats and if you do well eat whatever you want.

## 2012-12-20 ENCOUNTER — Encounter: Payer: Self-pay | Admitting: Internal Medicine

## 2012-12-20 ENCOUNTER — Other Ambulatory Visit: Payer: Self-pay | Admitting: Family Medicine

## 2012-12-20 DIAGNOSIS — R9389 Abnormal findings on diagnostic imaging of other specified body structures: Secondary | ICD-10-CM

## 2012-12-28 ENCOUNTER — Telehealth: Payer: Self-pay | Admitting: *Deleted

## 2012-12-28 MED ORDER — DIAZEPAM 5 MG PO TABS: 5.0000 mg | ORAL_TABLET | Freq: Once | ORAL | Status: DC

## 2012-12-28 NOTE — Telephone Encounter (Signed)
rx sent pt notified 

## 2012-12-28 NOTE — Telephone Encounter (Signed)
Ok for valium 5 mg single dose, 20 min before MRI

## 2012-12-28 NOTE — Telephone Encounter (Signed)
Pt called requesting a medication called in for her to relax during her MRI.  Please advise

## 2012-12-31 ENCOUNTER — Ambulatory Visit
Admission: RE | Admit: 2012-12-31 | Discharge: 2012-12-31 | Disposition: A | Discharge status: Home / Self Care | Payer: Medicare Other | Source: Ambulatory Visit | Attending: Internal Medicine | Admitting: Internal Medicine

## 2012-12-31 DIAGNOSIS — R9389 Abnormal findings on diagnostic imaging of other specified body structures: Secondary | ICD-10-CM

## 2012-12-31 MED ORDER — GADOBENATE DIMEGLUMINE 529 MG/ML IV SOLN
17.0000 mL | Freq: Once | INTRAVENOUS | Status: AC | PRN
  Administered 2012-12-31: 17 mL via INTRAVENOUS

## 2013-01-07 ENCOUNTER — Encounter: Payer: Self-pay | Admitting: Internal Medicine

## 2013-04-27 ENCOUNTER — Ambulatory Visit: Payer: Medicare Other | Admitting: Internal Medicine

## 2013-05-10 ENCOUNTER — Encounter: Payer: Self-pay | Admitting: Internal Medicine

## 2013-05-10 ENCOUNTER — Ambulatory Visit (INDEPENDENT_AMBULATORY_CARE_PROVIDER_SITE_OTHER): Payer: Medicare Other | Admitting: Internal Medicine

## 2013-05-10 VITALS — BP 138/72 | HR 70 | Temp 97.0°F | Ht 66.0 in | Wt 187.0 lb

## 2013-05-10 DIAGNOSIS — G4700 Insomnia, unspecified: Secondary | ICD-10-CM

## 2013-05-10 DIAGNOSIS — M25469 Effusion, unspecified knee: Secondary | ICD-10-CM

## 2013-05-10 DIAGNOSIS — M25462 Effusion, left knee: Secondary | ICD-10-CM

## 2013-05-10 DIAGNOSIS — C541 Malignant neoplasm of endometrium: Secondary | ICD-10-CM | POA: Insufficient documentation

## 2013-05-10 DIAGNOSIS — F411 Generalized anxiety disorder: Secondary | ICD-10-CM

## 2013-05-10 DIAGNOSIS — E785 Hyperlipidemia, unspecified: Secondary | ICD-10-CM

## 2013-05-10 MED ORDER — ALPRAZOLAM 0.5 MG PO TABS: 0.5000 mg | ORAL_TABLET | Freq: Three times a day (TID) | ORAL | Status: DC | PRN

## 2013-05-10 MED ORDER — ZOLPIDEM TARTRATE 5 MG PO TABS: ORAL_TABLET | ORAL | Status: DC

## 2013-05-10 NOTE — Patient Instructions (Signed)
Please continue all other medications as before, and refills have been done if requested. Please have the pharmacy call with any other refills you may need.  Please continue your efforts at being more active, low cholesterol diet, and weight control. You are otherwise up to date with prevention measures today.  Please go to the LAB in the Basement (turn left off the elevator) for the tests to be done at your convenience  You will be contacted by phone if any changes need to be made immediately.  Otherwise, you will receive a letter about your results with an explanation, but please check with MyChart first.  Please remember to sign up for MyChart if you have not done so, as this will be important to you in the future with finding out test results, communicating by private email, and scheduling acute appointments online when needed.  Please return in 6 months, or sooner if needed

## 2013-05-10 NOTE — Assessment & Plan Note (Signed)
stable overall by history and exam, recent data reviewed with pt, and pt to continue medical treatment as before,  to f/u any worsening symptoms or concerns Lab Results  Component Value Date   WBC 6.5 06/11/2012   HGB 13.4 06/11/2012   HCT 39.0 06/11/2012   PLT 199.0 06/11/2012   GLUCOSE 81 06/11/2012   CHOL 183 01/14/2012   TRIG 71.0 01/14/2012   HDL 65.60 01/14/2012   LDLDIRECT 129.4 02/14/2008   LDLCALC 103* 01/14/2012   ALT 42* 06/11/2012   AST 49* 06/11/2012   NA 137 06/11/2012   K 4.2 06/11/2012   CL 102 06/11/2012   CREATININE 0.8 06/11/2012   BUN 19 06/11/2012   CO2 29 06/11/2012   TSH 1.31 05/19/2012   For med refill

## 2013-05-10 NOTE — Progress Notes (Signed)
   Subjective:    Patient ID: Doris Zamora, female    DOB: [birth year], 80 y.o.   MRN: [id number]  HPI  Here to establish with new PCP,  Pt denies chest pain, increased sob or doe, wheezing, orthopnea, PND, increased LE swelling, palpitations, dizziness or syncope.  Pt denies polydipsia, polyuria, or low sugar symptoms such as weakness or confusion improved with po intake.  Pt denies new neurological symptoms such as new headache, or facial or extremity weakness or numbness.   Pt states overall good compliance with meds, has been trying to follow lower cholesterol, diet, with wt overall stable,  but little exercise however, but plans to try to do more.  Denies worsening depressive symptoms, suicidal ideation, or panic; has ongoing anxiety, asks for med refill, also for sleep med as has chronic difficulty getting to sleep, resorting to 2 oz wine per night recently to help. Also has 1 wk onset initial medial left knee pain which has improved but still knee with swelling, no giveaways or falls, no fever or trauma or hx of gout Past Medical History  Diagnosis Date  . ANXIETY   . HYPERLIPIDEMIA   . INSOMNIA-SLEEP DISORDER-UNSPEC   . WEIGHT LOSS   . Endometrial cancer 2007   Past Surgical History  Procedure Laterality Date  . Thyroidectomy, partial  1983  . Abdominal hysterectomy  2007  . Oophorectomy      reports that she has quit smoking. Her smoking use included Cigarettes. She smoked 0.00 packs per day for 50 years. She has never used smokeless tobacco. She reports that she does not drink alcohol or use illicit drugs. family history includes Diabetes in her mother. Allergies  Allergen Reactions  . Synthroid [Levothyroxine Sodium] Swelling    Happened yrs ago   Review of Systems  Constitutional: Negative for unexpected weight change, or unusual diaphoresis  HENT: Negative for tinnitus.   Eyes: Negative for photophobia and visual disturbance.  Respiratory: Negative for choking and stridor.    Gastrointestinal: Negative for vomiting and blood in stool.  Genitourinary: Negative for hematuria and decreased urine volume.  Musculoskeletal: Negative for acute joint swelling Skin: Negative for color change and wound.  Neurological: Negative for tremors and numbness other than noted  Psychiatric/Behavioral: Negative for decreased concentration or  hyperactivity.       Objective:   Physical Exam BP 138/72  Pulse 70  Temp(Src) 97 F (36.1 C) (Oral)  Ht 5\' 6"  (1.676 m)  Wt 187 lb (84.823 kg)  BMI 30.20 kg/m2  SpO2 96% VS noted,  Constitutional: Pt appears well-developed and well-nourished.  HENT: Head: NCAT.  Right Ear: External ear normal.  Left Ear: External ear normal.  Eyes: Conjunctivae and EOM are normal. Pupils are equal, round, and reactive to light.  Neck: Normal range of motion. Neck supple.  Cardiovascular: Normal rate and regular rhythm.   Pulmonary/Chest: Effort normal and breath sounds normal.  Abd:  Soft, NT, non-distended, + BS Neurological: Pt is alert. Not confused  Skin: Skin is warm. No erythema.  Psychiatric: Pt behavior is normal. Thought content normal.  Left knee with 1+ effusion, NT, but with bony degenerative changes    Assessment & Plan:

## 2013-05-10 NOTE — Assessment & Plan Note (Signed)
Chronic persistent, for med refill,  to f/u any worsening symptoms or concerns j

## 2013-05-10 NOTE — Assessment & Plan Note (Signed)
Likely  Post DJD flare, with pain improved, and I suspect effusion should resolve slowly as well; for tylenol prn,  to f/u any worsening symptoms or concerns

## 2013-05-10 NOTE — Assessment & Plan Note (Addendum)
stable overall by history and exam, recent data reviewed with pt, and pt to continue medical treatment as before,  to f/u any worsening symptoms or concerns Lab Results  Component Value Date   LDLCALC 103* 01/14/2012   For f/u labs

## 2013-05-10 NOTE — Progress Notes (Signed)
Pre visit review using our clinic review tool, if applicable. No additional management support is needed unless otherwise documented below in the visit note. 

## 2013-05-11 ENCOUNTER — Encounter: Payer: Self-pay | Admitting: Internal Medicine

## 2013-05-11 ENCOUNTER — Ambulatory Visit (INDEPENDENT_AMBULATORY_CARE_PROVIDER_SITE_OTHER): Payer: Medicare Other

## 2013-05-11 DIAGNOSIS — E785 Hyperlipidemia, unspecified: Secondary | ICD-10-CM

## 2013-05-11 LAB — URINALYSIS, ROUTINE W REFLEX MICROSCOPIC
Bilirubin Urine: NEGATIVE
Hgb urine dipstick: NEGATIVE
Ketones, ur: NEGATIVE
LEUKOCYTES UA: NEGATIVE
Nitrite: NEGATIVE
PH: 6 (ref 5.0–8.0)
RBC / HPF: NONE SEEN (ref 0–?)
SPECIFIC GRAVITY, URINE: 1.015 (ref 1.000–1.030)
TOTAL PROTEIN, URINE-UPE24: NEGATIVE
Urine Glucose: NEGATIVE
Urobilinogen, UA: 0.2 (ref 0.0–1.0)

## 2013-05-11 LAB — CBC WITH DIFFERENTIAL/PLATELET
BASOS PCT: 0.6 % (ref 0.0–3.0)
Basophils Absolute: 0 10*3/uL (ref 0.0–0.1)
Eosinophils Absolute: 0 10*3/uL (ref 0.0–0.7)
Eosinophils Relative: 0.5 % (ref 0.0–5.0)
HEMATOCRIT: 40.3 % (ref 36.0–46.0)
Hemoglobin: 13.4 g/dL (ref 12.0–15.0)
Lymphocytes Relative: 34.7 % (ref 12.0–46.0)
Lymphs Abs: 2.2 10*3/uL (ref 0.7–4.0)
MCHC: 33.3 g/dL (ref 30.0–36.0)
MCV: 93.4 fl (ref 78.0–100.0)
MONOS PCT: 8.4 % (ref 3.0–12.0)
Monocytes Absolute: 0.5 10*3/uL (ref 0.1–1.0)
NEUTROS PCT: 55.8 % (ref 43.0–77.0)
Neutro Abs: 3.6 10*3/uL (ref 1.4–7.7)
PLATELETS: 235 10*3/uL (ref 150.0–400.0)
RBC: 4.32 Mil/uL (ref 3.87–5.11)
RDW: 13.8 % (ref 11.5–14.6)
WBC: 6.4 10*3/uL (ref 4.5–10.5)

## 2013-05-11 LAB — LIPID PANEL
CHOL/HDL RATIO: 3
Cholesterol: 195 mg/dL (ref 0–200)
HDL: 68.9 mg/dL (ref >39.00)
LDL CALC: 119 mg/dL — AB (ref 0–99)
Triglycerides: 38 mg/dL (ref 0.0–149.0)
VLDL: 7.6 mg/dL (ref 0.0–40.0)

## 2013-05-11 LAB — BASIC METABOLIC PANEL
BUN: 14 mg/dL (ref 6–23)
CHLORIDE: 101 meq/L (ref 96–112)
CO2: 30 mEq/L (ref 19–32)
Calcium: 9.3 mg/dL (ref 8.4–10.5)
Creatinine, Ser: 0.7 mg/dL (ref 0.4–1.2)
GFR: 100 mL/min (ref >60.00)
GLUCOSE: 76 mg/dL (ref 70–99)
POTASSIUM: 4.4 meq/L (ref 3.5–5.1)
Sodium: 137 mEq/L (ref 135–145)

## 2013-05-11 LAB — TSH: TSH: 1.39 u[IU]/mL (ref 0.35–5.50)

## 2013-05-12 LAB — HEPATIC FUNCTION PANEL
ALBUMIN: 3.8 g/dL (ref 3.5–5.2)
ALT: 38 U/L — AB (ref 0–35)
AST: 39 U/L — AB (ref 0–37)
Alkaline Phosphatase: 70 U/L (ref 39–117)
Bilirubin, Direct: 0.1 mg/dL (ref 0.0–0.3)
TOTAL PROTEIN: 8.2 g/dL (ref 6.0–8.3)
Total Bilirubin: 0.6 mg/dL (ref 0.3–1.2)

## 2013-05-12 NOTE — Telephone Encounter (Signed)
This encounter was created in error - please disregard.

## 2013-06-14 ENCOUNTER — Ambulatory Visit (INDEPENDENT_AMBULATORY_CARE_PROVIDER_SITE_OTHER): Payer: Medicare Other | Admitting: Family Medicine

## 2013-06-14 ENCOUNTER — Other Ambulatory Visit: Payer: Medicare Other

## 2013-06-14 ENCOUNTER — Ambulatory Visit (INDEPENDENT_AMBULATORY_CARE_PROVIDER_SITE_OTHER)
Admission: RE | Admit: 2013-06-14 | Discharge: 2013-06-14 | Disposition: A | Discharge status: Home / Self Care | Payer: Medicare Other | Source: Ambulatory Visit | Attending: Family Medicine | Admitting: Family Medicine

## 2013-06-14 ENCOUNTER — Encounter: Payer: Self-pay | Admitting: Family Medicine

## 2013-06-14 VITALS — BP 112/68 | HR 67 | Wt 189.0 lb

## 2013-06-14 DIAGNOSIS — M1710 Unilateral primary osteoarthritis, unspecified knee: Secondary | ICD-10-CM

## 2013-06-14 DIAGNOSIS — M25569 Pain in unspecified knee: Secondary | ICD-10-CM

## 2013-06-14 DIAGNOSIS — M25562 Pain in left knee: Secondary | ICD-10-CM

## 2013-06-14 NOTE — Progress Notes (Signed)
  [facility] [address] [place], [place] [address] Phone: [phone] Subjective:    I'm seeing this patient by the request  of:  Cathlean Cower, MD   CC: Knee pain  [id number] Doris Zamora is a 80 y.o. female coming in with complaint of left knee pain. Patient states for the last 3 months it seems to be getting worse. Patient describes it as a constant dull aching pain that is worse with ambulation. States that it does get better with rest or sometimes ice or heat. Patient has tried over-the-counter medicines without any significant improvement. Denies any radiation down the leg and he numbness or weakness. Patient though has missed her exercise classes a couple time secondary to the pain. Denies any nighttime awakening. Patient the severity is 6/10.     Past medical history, social, surgical and family history all reviewed in electronic medical record.   Review of Systems: No headache, visual changes, nausea, vomiting, diarrhea, constipation, dizziness, abdominal pain, skin rash, fevers, chills, night sweats, weight loss, swollen lymph nodes, body aches, joint swelling, muscle aches, chest pain, shortness of breath, mood changes.   Objective Blood pressure 112/68, pulse 67, weight 189 lb (85.73 kg), SpO2 96.00%.  General: No apparent distress alert and oriented x3 mood and affect normal, dressed appropriately.  HEENT: Pupils equal, extraocular movements intact  Respiratory: Patient's speak in full sentences and does not appear short of breath  Cardiovascular: No lower extremity edema, non tender, no erythema  Skin: Warm dry intact with no signs of infection or rash on extremities or on axial skeleton.  Abdomen: Soft nontender  Neuro: Cranial nerves II through XII are intact, neurovascularly intact in all extremities with 2+ DTRs and 2+ pulses.  Lymph: No lymphadenopathy of posterior or anterior cervical chain or axillae bilaterally.  Gait normal with  good balance and coordination.  MSK:  Non tender with full range of motion and good stability and symmetric strength and tone of shoulders, elbows, wrist, hip, and ankles bilaterally. Patient does have osteoarthritic changes in multiple joints. Knee: Left Osteoarthritic changes noted Palpation reveals medial and lateral joint line tenderness ROM shows patient is lacking the last 5 of extension as well as flexion. Ligaments with solid consistent endpoints including ACL, PCL, LCL, MCL. Negative Mcmurray's, Apley's, and Thessalonian tests.  painful patellar compression. Patellar glide with mild crepitus. Patellar and quadriceps tendons unremarkable. Hamstring and quadriceps strength is normal.  Contralateral knee has some osteophytic changes but does have full range of motion and is only minimally tender to palpation.  After informed written and verbal consent, patient was seated on exam table. Left knee was prepped with alcohol swab and utilizing anterolateral approach, patient's left knee space was injected with 4:1  marcaine 0.5%: Kenalog 40mg /dL. Patient tolerated the procedure well without immediate complications.   Impression and Recommendations:     This case required medical decision making of moderate complexity.

## 2013-06-14 NOTE — Patient Instructions (Signed)
Very nice to meet you Xrays downstairs today.  Take tylenol 650 mg three times a day is the best evidence based medicine we have for arthritis.  Glucosamine sulfate 750mg  twice a day is a supplement that has been shown to help moderate to severe arthritis. Vitamin D 2000 IU daily Fish oil 2 grams daily.  Tumeric 500mg  twice daily.  Capsaicin topically up to four times a day may also help with pain. Cortisone injections are an option if these interventions do not seem to make a difference or need more relief. We tried this today.  If cortisone injections do not help, there are different types of shots that may help but they take longer to take effect.  We can discuss this at follow up.  It's important that you continue to stay active. Controlling your weight is important.  Consider physical therapy to strengthen muscles around the joint that hurts to take pressure off of the joint itself. Shoe inserts with good arch support may be helpful.  [employer] orthotics at [employer] could help.  Water aerobics and cycling with low resistance are the best two types of exercise for arthritis. Come back and see me in 3 weeks.

## 2013-06-14 NOTE — Assessment & Plan Note (Signed)
Patient's will do home exercises physical therapy, we discussed bracing the patient declined today. We discussed icing protocol as well as staying active and proper shoe choices. Patient does have some arthritis we discussed over-the-counter medications he can be beneficial as well. Patient will try these interventions and come back again in 3 weeks for further evaluation. Due to patient's age and likely severity of her arthritis we will get an x-ray. Patient has any signs of moderate to severe arthritis patient may also be a candidate for viscous supplementation. We'll discuss further at followup.

## 2013-07-06 ENCOUNTER — Encounter: Payer: Self-pay | Admitting: Internal Medicine

## 2013-07-06 ENCOUNTER — Ambulatory Visit (INDEPENDENT_AMBULATORY_CARE_PROVIDER_SITE_OTHER): Payer: Medicare Other | Admitting: Internal Medicine

## 2013-07-06 VITALS — BP 120/70 | HR 80 | Temp 98.2°F | Wt 187.0 lb

## 2013-07-06 DIAGNOSIS — M1710 Unilateral primary osteoarthritis, unspecified knee: Secondary | ICD-10-CM

## 2013-07-06 DIAGNOSIS — F411 Generalized anxiety disorder: Secondary | ICD-10-CM

## 2013-07-06 DIAGNOSIS — E785 Hyperlipidemia, unspecified: Secondary | ICD-10-CM

## 2013-07-06 NOTE — Progress Notes (Signed)
Pre visit review using our clinic review tool, if applicable. No additional management support is needed unless otherwise documented below in the visit note. 

## 2013-07-06 NOTE — Assessment & Plan Note (Signed)
stable overall by history and exam, recent data reviewed with pt, and pt to continue medical treatment as before,  to f/u any worsening symptoms or concerns Lab Results  Component Value Date   LDLCALC 119* 05/11/2013   For lower chol diet, d/w pt

## 2013-07-06 NOTE — Assessment & Plan Note (Signed)
stable overall by history and exam, recent data reviewed with pt, and pt to continue medical treatment as before,  to f/u any worsening symptoms or concerns Lab Results  Component Value Date   WBC 6.4 05/11/2013   HGB 13.4 05/11/2013   HCT 40.3 05/11/2013   PLT 235.0 05/11/2013   GLUCOSE 76 05/11/2013   CHOL 195 05/11/2013   TRIG 38.0 05/11/2013   HDL 68.90 05/11/2013   LDLDIRECT 129.4 02/14/2008   LDLCALC 119* 05/11/2013   ALT 38* 05/12/2013   AST 39* 05/12/2013   NA 137 05/11/2013   K 4.4 05/11/2013   CL 101 05/11/2013   CREATININE 0.7 05/11/2013   BUN 14 05/11/2013   CO2 30 05/11/2013   TSH 1.39 05/11/2013

## 2013-07-06 NOTE — Progress Notes (Signed)
Subjective:    Patient ID: Doris Zamora, female    DOB: Feb 26, 1930, 80 y.o.   MRN: [id number]  HPI  Here to f/u, overall doing well s/p cortisone left knee, with trace effusion only and minor pain at worse only to ambulate, no giveaways or falls.  Only has some aching later in the evening in the past wk now after walking on it during the day  Denies worsening depressive symptoms, suicidal ideation, or panic; has ongoing anxiety, not increased recently.   Trying to follow better low chol diet Past Medical History  Diagnosis Date  . ANXIETY   . HYPERLIPIDEMIA   . INSOMNIA-SLEEP DISORDER-UNSPEC   . WEIGHT LOSS   . Endometrial cancer 2007   Past Surgical History  Procedure Laterality Date  . Thyroidectomy, partial  1983  . Abdominal hysterectomy  2007  . Oophorectomy      reports that she has quit smoking. Her smoking use included Cigarettes. She smoked 0.00 packs per day for 50 years. She has never used smokeless tobacco. She reports that she does not drink alcohol or use illicit drugs. family history includes Diabetes in her mother. Allergies  Allergen Reactions  . Synthroid [Levothyroxine Sodium] Swelling    Happened yrs ago  . Pneumovax [Pneumococcal Polysaccharides]    Current Outpatient Prescriptions on File Prior to Visit  Medication Sig Dispense Refill  . ALPHA-LIPOIC ACID 100 MG TABS Take by mouth daily.      Marland Kitchen ALPRAZolam (XANAX) 0.5 MG tablet Take 1 tablet (0.5 mg total) by mouth 3 (three) times daily as needed.  90 tablet  5  . CALCIUM-MAGNESUIUM-ZINC 333-133-8.3 MG TABS Take by mouth daily.      . cholecalciferol (VITAMIN D) 1000 UNITS tablet Take 1,000 Units by mouth daily.      Marland Kitchen co-enzyme Q-10 30 MG capsule Take 100 mg by mouth daily.       . Omega-3 Fatty Acids (FISH OIL) 1000 MG CPDR Take by mouth daily.      Marland Kitchen zolpidem (AMBIEN) 5 MG tablet take 1 tablet by mouth at bedtime if needed for sleep  30 tablet  5   No current facility-administered medications on file  prior to visit.   Review of Systems  Constitutional: Negative for unusual diaphoresis or other sweats  HENT: Negative for ringing in ear Eyes: Negative for double vision or worsening visual disturbance.  Respiratory: Negative for choking and stridor.   Gastrointestinal: Negative for vomiting or other signifcant bowel change Genitourinary: Negative for hematuria or decreased urine volume.  Musculoskeletal: Negative for other MSK pain or swelling Skin: Negative for color change and worsening wound.  Neurological: Negative for tremors and numbness other than noted  Psychiatric/Behavioral: Negative for decreased concentration or agitation other than above       Objective:   Physical Exam BP 120/70  Pulse 80  Temp(Src) 98.2 F (36.8 C) (Oral)  Wt 187 lb (84.823 kg)  SpO2 95% VS noted,  Constitutional: Pt appears well-developed, well-nourished.  HENT: Head: NCAT.  Right Ear: External ear normal.  Left Ear: External ear normal.  Eyes: . Pupils are equal, round, and reactive to light. Conjunctivae and EOM are normal Neck: Normal range of motion. Neck supple.  Cardiovascular: Normal rate and regular rhythm.   Pulmonary/Chest: Effort normal and breath sounds normal.  Left knee with trace effusion, FROM, NT Neurological: Pt is alert. Not confused , motor grossly intact Skin: Skin is warm. No rash Psychiatric: Pt behavior is normal. No agitation.  not depressed affect, 1+ nervous    Assessment & Plan:

## 2013-07-06 NOTE — Assessment & Plan Note (Signed)
Improve symptomaticaly, cont same tx, cont activity and walking for exercise,  to f/u any worsening symptoms or concerns

## 2013-07-06 NOTE — Patient Instructions (Addendum)
Please continue all other medications as before, and refills have been done if requested. Please have the pharmacy call with any other refills you may need.  Please see Dr Tamala Julian if your knee gets worse again  Please return in 6 months, or sooner if needed

## 2013-08-03 ENCOUNTER — Ambulatory Visit (INDEPENDENT_AMBULATORY_CARE_PROVIDER_SITE_OTHER): Payer: Medicare Other | Admitting: Podiatry

## 2013-08-03 ENCOUNTER — Encounter: Payer: Self-pay | Admitting: Podiatry

## 2013-08-03 VITALS — BP 126/64 | HR 62 | Resp 14 | Ht 65.5 in | Wt 182.0 lb

## 2013-08-03 DIAGNOSIS — E785 Hyperlipidemia, unspecified: Secondary | ICD-10-CM

## 2013-08-03 DIAGNOSIS — M2010 Hallux valgus (acquired), unspecified foot: Secondary | ICD-10-CM

## 2013-08-03 DIAGNOSIS — Q828 Other specified congenital malformations of skin: Secondary | ICD-10-CM

## 2013-08-03 DIAGNOSIS — M21619 Bunion of unspecified foot: Secondary | ICD-10-CM

## 2013-08-03 NOTE — Progress Notes (Signed)
   Subjective:    Patient ID: Doris Zamora, female    DOB: 18-Jul-1930, 80 y.o.   MRN: [id number]  HPI Comments: N foot pain L left plantar 5th MPJ D 5 - 6 months O shoe wear C pain and hard skin A hurts on and off without trigger T file the area down  Foot Pain Associated symptoms include arthralgias.      Review of Systems  Eyes: Positive for itching.  Endocrine: Positive for polyuria.  Musculoskeletal: Positive for arthralgias.  All other systems reviewed and are negative.      Objective:   Physical Exam  Orientated x3 black female  Vascular: DP and PT pulses 2/4 bilaterally  Neurological: Sensation to 10 g monofilament wire intact 5/5 bilaterally Vibratory sensation intact bilaterally Ankle reflexes equal and reactive bilaterally  Dermatological: Nucleated keratoses plantar fifth MPJ left Diffuse circular keratoses first MPJ left, first MPJ right second MPJ right and fifth MPJ right Well-healed surgical scar medial right first MPJ  Musculoskeletal: Bilateral Taylor's bunion deformities       Assessment & Plan:   Assessment: Satisfactory neurovascular status Bilateral Taylor's bunion deformities Porokeratoses plantar fifth left MPJ  Plan: Porokeratoses was debrided and packed with salinocaine. Patient was advised that this lesion was chronic it would recur and return as needed for debridement.

## 2013-09-21 ENCOUNTER — Encounter: Payer: Self-pay | Admitting: Internal Medicine

## 2013-09-21 ENCOUNTER — Ambulatory Visit (INDEPENDENT_AMBULATORY_CARE_PROVIDER_SITE_OTHER)
Admission: RE | Admit: 2013-09-21 | Discharge: 2013-09-21 | Disposition: A | Discharge status: Home / Self Care | Payer: Medicare Other | Source: Ambulatory Visit | Attending: Internal Medicine | Admitting: Internal Medicine

## 2013-09-21 ENCOUNTER — Ambulatory Visit (INDEPENDENT_AMBULATORY_CARE_PROVIDER_SITE_OTHER): Payer: Medicare Other | Admitting: Internal Medicine

## 2013-09-21 VITALS — BP 120/70 | HR 69 | Temp 98.0°F | Wt 186.2 lb

## 2013-09-21 DIAGNOSIS — M25552 Pain in left hip: Secondary | ICD-10-CM

## 2013-09-21 DIAGNOSIS — M545 Low back pain, unspecified: Secondary | ICD-10-CM

## 2013-09-21 DIAGNOSIS — M25559 Pain in unspecified hip: Secondary | ICD-10-CM

## 2013-09-21 MED ORDER — TIZANIDINE HCL 4 MG PO TABS: 4.0000 mg | ORAL_TABLET | Freq: Four times a day (QID) | ORAL | Status: DC | PRN

## 2013-09-21 NOTE — Progress Notes (Signed)
Pre visit review using our clinic review tool, if applicable. No additional management support is needed unless otherwise documented below in the visit note. 

## 2013-09-21 NOTE — Progress Notes (Signed)
   Subjective:    Patient ID: Doris Zamora, female    DOB: Aug 08, 1930, 80 y.o.   MRN: [id number]  HPI  Here to f/u, unfortunately fell to the floor when sitting in a chair that moved and toppled, fell to left side, no apparent injury but since then Pt continues to have recurring left LBP without change in severity, bowel or bladder change, fever, wt loss,  worsening LE pain/numbness/weakness, gait change or falls.  Has some left lateral hip pain with ambulation as well, overall worse with getting in and out of car, nothing seems to make better.  No other falls or injury. .Denies urinary symptoms such as dysuria, frequency, urgency, flank pain, hematuria or n/v, fever, chills. Past Medical History  Diagnosis Date  . ANXIETY   . HYPERLIPIDEMIA   . INSOMNIA-SLEEP DISORDER-UNSPEC   . WEIGHT LOSS   . Endometrial cancer 2007   Past Surgical History  Procedure Laterality Date  . Abdominal hysterectomy  2007  . Oophorectomy  2007  . Thyroidectomy, partial  1983    reports that she quit smoking about 19 years ago. Her smoking use included Cigarettes. She smoked 0.00 packs per day for 50 years. She has never used smokeless tobacco. She reports that she drinks about 4.2 ounces of alcohol per week. She reports that she does not use illicit drugs. family history includes Diabetes in her mother; Muscular dystrophy in her father. Allergies  Allergen Reactions  . Synthroid [Levothyroxine Sodium] Swelling    Happened yrs ago  . Pneumovax [Pneumococcal Polysaccharide Vaccine]    Current Outpatient Prescriptions on File Prior to Visit  Medication Sig Dispense Refill  . ALPHA-LIPOIC ACID 100 MG TABS Take by mouth daily.      Marland Kitchen ALPRAZolam (XANAX) 0.5 MG tablet Take 1 tablet (0.5 mg total) by mouth 3 (three) times daily as needed.  90 tablet  5  . CALCIUM-MAGNESUIUM-ZINC 333-133-8.3 MG TABS Take by mouth daily.      . cholecalciferol (VITAMIN D) 1000 UNITS tablet Take 1,000 Units by mouth daily.      Marland Kitchen  co-enzyme Q-10 30 MG capsule Take 100 mg by mouth daily.       . Omega-3 Fatty Acids (FISH OIL) 1000 MG CPDR Take by mouth daily.      Marland Kitchen zolpidem (AMBIEN) 5 MG tablet take 1 tablet by mouth at bedtime if needed for sleep  30 tablet  5   No current facility-administered medications on file prior to visit.   Review of Systems All otherwise neg per pt     Objective:   Physical Exam BP 120/70  Pulse 69  Temp(Src) 98 F (36.7 C) (Oral)  Wt 186 lb 4 oz (84.482 kg)  SpO2 98% VS noted,  Constitutional: Pt appears well-developed, well-nourished.  HENT: Head: NCAT.  Right Ear: External ear normal.  Left Ear: External ear normal.  Eyes: . Pupils are equal, round, and reactive to light. Conjunctivae and EOM are normal Neck: Normal range of motion. Neck supple.  Cardiovascular: Normal rate and regular rhythm.   Pulmonary/Chest: Effort normal and breath sounds normal.  Abd:  Soft, NT, ND, + BS Mild left lateral lower back tender without swelling or rash Spine nontender No hip tenderness laterally, has some mild pain on left hip ful flexion Neurological: Pt is alert. Not confused , motor grossly intact Skin: Skin is warm. No rash Psychiatric: Pt behavior is normal. No agitation.     Assessment & Plan:

## 2013-09-21 NOTE — Patient Instructions (Signed)
Please take all new medication as prescribed - the muscle relaxer  You can also take tylenol 8 hr  - 1 every 8 hrs as  Needed  Please continue all other medications as before, and refills have been done if requested.  Please have the pharmacy call with any other refills you may need.  Please go to the XRAY Department in the Basement (go straight as you get off the elevator) for the x-ray testing  You will be contacted by phone if any changes need to be made immediately.  Otherwise, you will receive a letter about your results with an explanation, but please check with MyChart first.

## 2013-09-23 ENCOUNTER — Encounter (HOSPITAL_COMMUNITY): Payer: Self-pay | Admitting: Emergency Medicine

## 2013-09-23 ENCOUNTER — Emergency Department (INDEPENDENT_AMBULATORY_CARE_PROVIDER_SITE_OTHER)
Admission: EM | Admit: 2013-09-23 | Discharge: 2013-09-23 | Disposition: A | Discharge status: Home / Self Care | Payer: Medicare Other | Source: Home / Self Care | Attending: Family Medicine | Admitting: Family Medicine

## 2013-09-23 DIAGNOSIS — M25462 Effusion, left knee: Secondary | ICD-10-CM

## 2013-09-23 DIAGNOSIS — M25469 Effusion, unspecified knee: Secondary | ICD-10-CM

## 2013-09-23 DIAGNOSIS — M1710 Unilateral primary osteoarthritis, unspecified knee: Secondary | ICD-10-CM

## 2013-09-23 DIAGNOSIS — M1712 Unilateral primary osteoarthritis, left knee: Secondary | ICD-10-CM

## 2013-09-23 MED ORDER — METHYLPREDNISOLONE ACETATE 40 MG/ML IJ SUSP
INTRAMUSCULAR | Status: AC
  Filled 2013-09-23: qty 5

## 2013-09-23 MED ORDER — METHYLPREDNISOLONE ACETATE 40 MG/ML IJ SUSP: 40.0000 mg | Freq: Once | INTRAMUSCULAR | Status: DC

## 2013-09-23 NOTE — Discharge Instructions (Signed)
Thank you for coming in today. Continue tylenol.  Follow up with your doctor.  Call or go to the ER if you develop a large red swollen joint with extreme pain or oozing puss.    Joint Injection Care After Refer to this sheet in the next few days. These instructions provide you with information on caring for yourself after you have had a joint injection. Your caregiver also may give you more specific instructions. Your treatment has been planned according to current medical practices, but problems sometimes occur. Call your caregiver if you have any problems or questions after your procedure. After any type of joint injection, it is not uncommon to experience:  Soreness, swelling, or bruising around the injection site.  Mild numbness, tingling, or weakness around the injection site caused by the numbing medicine used before or with the injection. It also is possible to experience the following effects associated with the specific agent after injection:  Iodine-based contrast agents:  Allergic reaction (itching, hives, widespread redness, and swelling beyond the injection site).  Corticosteroids (These effects are rare.):  Allergic reaction.  Increased blood sugar levels (If you have diabetes and you notice that your blood sugar levels have increased, notify your caregiver).  Increased blood pressure levels.  Mood swings.  Hyaluronic acid in the use of viscosupplementation.  Temporary heat or redness.  Temporary rash and itching.  Increased fluid accumulation in the injected joint. These effects all should resolve within a day after your procedure.  HOME CARE INSTRUCTIONS  Limit yourself to light activity the day of your procedure. Avoid lifting heavy objects, bending, stooping, or twisting.  Take prescription or over-the-counter pain medication as directed by your caregiver.  You may apply ice to your injection site to reduce pain and swelling the day of your procedure. Ice  may be applied 03-04 times:  Put ice in a plastic bag.  Place a towel between your skin and the bag.  Leave the ice on for no longer than 15-20 minutes each time. SEEK IMMEDIATE MEDICAL CARE IF:   Pain and swelling get worse rather than better or extend beyond the injection site.  Numbness does not go away.  Blood or fluid continues to leak from the injection site.  You have chest pain.  You have swelling of your face or tongue.  You have trouble breathing or you become dizzy.  You develop a fever, chills, or severe tenderness at the injection site that last longer than 1 day. MAKE SURE YOU:  Understand these instructions.  Watch your condition.  Get help right away if you are not doing well or if you get worse. Document Released: 10/10/2010 Document Revised: 04/21/2011 Document Reviewed: 10/10/2010 [facility] Patient Information 2015 [place], [place]. This information is not intended to replace advice given to you by your health care provider. Make sure you discuss any questions you have with your health care provider.

## 2013-09-23 NOTE — ED Notes (Signed)
C/o L knee pain all day. Hx. Arthritis.  States its swollen. States its worse today and has been hopping on her other leg.  Had it injected with steroids 2 mos. ago but does not remember the doctors name, in the [facility] [address] on [place].

## 2013-09-23 NOTE — ED Provider Notes (Signed)
Doris Zamora is a 80 y.o. female who presents to Urgent Care today for left knee pain. Patient has a one-day history of left knee pain. She denies any injury. She has a history of left knee DJD managed with corticosteroid injection about 3 months ago. He worked well and she would like to repeat injection today if possible. She denies any radiating pain weakness or numbness. The pain is moderate and worse with activity.   Past Medical History  Diagnosis Date  . ANXIETY   . HYPERLIPIDEMIA   . INSOMNIA-SLEEP DISORDER-UNSPEC   . WEIGHT LOSS   . Endometrial cancer 2007   History  Substance Use Topics  . Smoking status: Former Smoker -- 50 years    Types: Cigarettes    Quit date: 02/10/1994  . Smokeless tobacco: Never Used  . Alcohol Use: 4.2 oz/week    7 Glasses of wine per week     Comment: 2 0z. HS to help her sleep   ROS as above Medications: Current Facility-Administered Medications  Medication Dose Route Frequency Provider Last Rate Last Dose  . methylPREDNISolone acetate (DEPO-MEDROL) injection 40 mg  40 mg Intra-articular Once Gregor Hams, MD       Current Outpatient Prescriptions  Medication Sig Dispense Refill  . acetaminophen (TYLENOL EX ST ARTHRITIS PAIN) 500 MG tablet Take 500 mg by mouth every 6 (six) hours as needed.      Marland Kitchen acetaminophen (TYLENOL) 650 MG CR tablet Take 1,300 mg by mouth every 8 (eight) hours as needed for pain.      . cholecalciferol (VITAMIN D) 1000 UNITS tablet Take 1,000 Units by mouth daily.      Marland Kitchen co-enzyme Q-10 30 MG capsule Take 100 mg by mouth daily.       . Multiple Vitamins-Minerals (MULTIVITAMIN WITH MINERALS) tablet Take 1 tablet by mouth daily. Forward Gold      . Omega-3 Fatty Acids (FISH OIL) 1000 MG CPDR Take by mouth daily.      . ALPHA-LIPOIC ACID 100 MG TABS Take by mouth daily.      Marland Kitchen ALPRAZolam (XANAX) 0.5 MG tablet Take 1 tablet (0.5 mg total) by mouth 3 (three) times daily as needed.  90 tablet  5  . CALCIUM-MAGNESUIUM-ZINC  333-133-8.3 MG TABS Take by mouth daily.      Marland Kitchen tiZANidine (ZANAFLEX) 4 MG tablet Take 1 tablet (4 mg total) by mouth every 6 (six) hours as needed for muscle spasms.  40 tablet  1  . zolpidem (AMBIEN) 5 MG tablet take 1 tablet by mouth at bedtime if needed for sleep  30 tablet  5    Exam:  BP 157/66  Pulse 67  Temp(Src) 97.8 F (36.6 C) (Oral)  Resp 18  SpO2 100% Gen: Well NAD Left knee: Moderate effusion. Tender to palpation medial joint line. Range of motion 5-120 Stable ligamentous exam Capillary refill pulses and sensation are intact distally  Knee injection. Left knee Consent obtained and timeout performed. Patient seated in a comfortable position with legs hanging off the table.  The medial Peri-patellar tendon space was palpated and marked. The skin was then cleaned with alcohol. Cold spray applied. A 25-gauge inch and a half needle was used to inject 40 mg of Depo-Medrol and 4 mL of marcaine. Patient tolerated procedure well no bleeding. Pain improved following injection  Knee x-ray reviewed: No results found for this or any previous visit (from the past 24 hour(s)). No results found.  Assessment and Plan: 80 y.o.  female with left knee DJD. Flare of arthritis. Plan to treat with corticosteroid injection. Tylenol. Followup with primary care provider.  Discussed warning signs or symptoms. Please see discharge instructions. Patient expresses understanding.   This note was created using Systems analyst. Any transcription errors are unintended.    Gregor Hams, MD 09/23/13 [id number]

## 2013-09-25 NOTE — Assessment & Plan Note (Signed)
With trauma now 2 wks out, for films but seems less likely pain related to fx, ok for tylenol prn, tizanidine prn, to f/u any worsening symptoms or concerns

## 2013-09-25 NOTE — Assessment & Plan Note (Signed)
Also for left hip film with pain on exam

## 2013-11-09 ENCOUNTER — Encounter: Payer: Self-pay | Admitting: Internal Medicine

## 2013-11-09 ENCOUNTER — Ambulatory Visit (INDEPENDENT_AMBULATORY_CARE_PROVIDER_SITE_OTHER): Payer: Medicare Other | Admitting: Internal Medicine

## 2013-11-09 DIAGNOSIS — E785 Hyperlipidemia, unspecified: Secondary | ICD-10-CM

## 2013-11-09 DIAGNOSIS — F411 Generalized anxiety disorder: Secondary | ICD-10-CM

## 2013-11-09 DIAGNOSIS — G4700 Insomnia, unspecified: Secondary | ICD-10-CM

## 2013-11-09 MED ORDER — ALPRAZOLAM 0.5 MG PO TABS: 0.5000 mg | ORAL_TABLET | Freq: Three times a day (TID) | ORAL | Status: DC | PRN

## 2013-11-09 NOTE — Assessment & Plan Note (Signed)
stable overall by history and exam, and pt to continue medical treatment as before,  to f/u any worsening symptoms or concerns 

## 2013-11-09 NOTE — Assessment & Plan Note (Signed)
stable overall by history and exam, recent data reviewed with pt, and pt to continue medical treatment as before,  to f/u any worsening symptoms or concerns  last LDL 50  - April 2015

## 2013-11-09 NOTE — Progress Notes (Signed)
Subjective:    Patient ID: Doris Zamora, female    DOB: 1930-11-20, 80 y.o.   MRN: [id number]  HPI   Here to f/u; overall doing ok,  Pt denies chest pain, increased sob or doe, wheezing, orthopnea, PND, increased LE swelling, palpitations, dizziness or syncope.  Pt denies polydipsia, polyuria, or low sugar symptoms such as weakness or confusion improved with po intake.  Pt denies new neurological symptoms such as new headache, or facial or extremity weakness or numbness.   Pt states overall good compliance with meds, has been trying to follow lower cholesterol diet, with wt overall stable,  but little exercise however.  Denies worsening depressive symptoms, suicidal ideation, or panic; has ongoing anxiety.  Current med working well for insomnia.  Left knee pain improved after cortisone shot per UC on aug 14 Past Medical History  Diagnosis Date  . ANXIETY   . HYPERLIPIDEMIA   . INSOMNIA-SLEEP DISORDER-UNSPEC   . WEIGHT LOSS   . Endometrial cancer 2007   Past Surgical History  Procedure Laterality Date  . Abdominal hysterectomy  2007  . Oophorectomy  2007  . Thyroidectomy, partial  1983    reports that she quit smoking about 19 years ago. Her smoking use included Cigarettes. She smoked 0.00 packs per day for 50 years. She has never used smokeless tobacco. She reports that she drinks about 4.2 ounces of alcohol per week. She reports that she does not use illicit drugs. family history includes Diabetes in her mother; Muscular dystrophy in her father. Allergies  Allergen Reactions  . Synthroid [Levothyroxine Sodium] Swelling    Happened yrs ago  . Pneumovax [Pneumococcal Polysaccharide Vaccine]    Current Outpatient Prescriptions on File Prior to Visit  Medication Sig Dispense Refill  . acetaminophen (TYLENOL EX ST ARTHRITIS PAIN) 500 MG tablet Take 500 mg by mouth every 6 (six) hours as needed.      Marland Kitchen acetaminophen (TYLENOL) 650 MG CR tablet Take 1,300 mg by mouth every 8 (eight) hours  as needed for pain.      . ALPHA-LIPOIC ACID 100 MG TABS Take by mouth daily.      Marland Kitchen ALPRAZolam (XANAX) 0.5 MG tablet Take 1 tablet (0.5 mg total) by mouth 3 (three) times daily as needed.  90 tablet  5  . CALCIUM-MAGNESUIUM-ZINC 333-133-8.3 MG TABS Take by mouth daily.      . cholecalciferol (VITAMIN D) 1000 UNITS tablet Take 1,000 Units by mouth daily.      Marland Kitchen co-enzyme Q-10 30 MG capsule Take 100 mg by mouth daily.       . Multiple Vitamins-Minerals (MULTIVITAMIN WITH MINERALS) tablet Take 1 tablet by mouth daily. Forward Gold      . Omega-3 Fatty Acids (FISH OIL) 1000 MG CPDR Take by mouth daily.      Marland Kitchen tiZANidine (ZANAFLEX) 4 MG tablet Take 1 tablet (4 mg total) by mouth every 6 (six) hours as needed for muscle spasms.  40 tablet  1  . zolpidem (AMBIEN) 5 MG tablet take 1 tablet by mouth at bedtime if needed for sleep  30 tablet  5   No current facility-administered medications on file prior to visit.   Review of Systems  Constitutional: Negative for unusual diaphoresis or other sweats  HENT: Negative for ringing in ear Eyes: Negative for double vision or worsening visual disturbance.  Respiratory: Negative for choking and stridor.   Gastrointestinal: Negative for vomiting or other signifcant bowel change Genitourinary: Negative for hematuria or decreased  urine volume.  Musculoskeletal: Negative for other MSK pain or swelling Skin: Negative for color change and worsening wound.  Neurological: Negative for tremors and numbness other than noted  Psychiatric/Behavioral: Negative for decreased concentration or agitation other than above       Objective:   Physical Exam There were no vitals taken for this visit. VS noted,  Constitutional: Pt appears well-developed, well-nourished.  HENT: Head: NCAT.  Right Ear: External ear normal.  Left Ear: External ear normal.  Eyes: . Pupils are equal, round, and reactive to light. Conjunctivae and EOM are normal Neck: Normal range of motion.  Neck supple.  Cardiovascular: Normal rate and regular rhythm.   Pulmonary/Chest: Effort normal and breath sounds normal.  Abd:  Soft, NT, ND, + BS Neurological: Pt is alert. Not confused , motor grossly intact Skin: Skin is warm. No rash Left knee  Psychiatric: Pt behavior is normal. No agitation. mild nervous    Assessment & Plan:

## 2013-11-09 NOTE — Assessment & Plan Note (Signed)
stable overall by history and exam, recent data reviewed with pt, and pt to continue medical treatment as before,  to f/u any worsening symptoms or concerns Lab Results  Component Value Date   WBC 6.4 05/11/2013   HGB 13.4 05/11/2013   HCT 40.3 05/11/2013   PLT 235.0 05/11/2013   GLUCOSE 76 05/11/2013   CHOL 195 05/11/2013   TRIG 38.0 05/11/2013   HDL 68.90 05/11/2013   LDLDIRECT 129.4 02/14/2008   LDLCALC 119* 05/11/2013   ALT 38* 05/12/2013   AST 39* 05/12/2013   NA 137 05/11/2013   K 4.4 05/11/2013   CL 101 05/11/2013   CREATININE 0.7 05/11/2013   BUN 14 05/11/2013   CO2 30 05/11/2013   TSH 1.39 05/11/2013

## 2013-11-09 NOTE — Patient Instructions (Signed)
Please continue all other medications as before, and refills have been done if requested.  Please have the pharmacy call with any other refills you may need.  Please continue your efforts at being more active, low cholesterol diet, and weight control.  You are otherwise up to date with prevention measures today.  Please keep your appointments with your specialists as you may have planned  No further lab work needed today  Please return in 6 months, or sooner if needed

## 2013-11-09 NOTE — Progress Notes (Signed)
Pre visit review using our clinic review tool, if applicable. No additional management support is needed unless otherwise documented below in the visit note. 

## 2013-12-28 ENCOUNTER — Other Ambulatory Visit: Payer: Self-pay | Admitting: Obstetrics & Gynecology

## 2013-12-28 ENCOUNTER — Ambulatory Visit: Payer: Medicare Other | Admitting: Internal Medicine

## 2013-12-29 ENCOUNTER — Other Ambulatory Visit: Payer: Self-pay | Admitting: Obstetrics & Gynecology

## 2013-12-29 DIAGNOSIS — N63 Unspecified lump in unspecified breast: Secondary | ICD-10-CM

## 2014-01-02 LAB — CYTOLOGY - PAP

## 2014-01-13 ENCOUNTER — Ambulatory Visit
Admission: RE | Admit: 2014-01-13 | Discharge: 2014-01-13 | Disposition: A | Discharge status: Home / Self Care | Payer: Medicare Other | Source: Ambulatory Visit | Attending: Obstetrics & Gynecology | Admitting: Obstetrics & Gynecology

## 2014-01-13 ENCOUNTER — Other Ambulatory Visit: Payer: Self-pay | Admitting: Obstetrics & Gynecology

## 2014-01-13 ENCOUNTER — Encounter (INDEPENDENT_AMBULATORY_CARE_PROVIDER_SITE_OTHER): Payer: Self-pay

## 2014-01-13 DIAGNOSIS — N63 Unspecified lump in unspecified breast: Secondary | ICD-10-CM

## 2014-01-23 ENCOUNTER — Other Ambulatory Visit: Payer: Self-pay | Admitting: Obstetrics & Gynecology

## 2014-01-23 DIAGNOSIS — N63 Unspecified lump in unspecified breast: Secondary | ICD-10-CM

## 2014-01-26 ENCOUNTER — Ambulatory Visit
Admission: RE | Admit: 2014-01-26 | Discharge: 2014-01-26 | Disposition: A | Discharge status: Home / Self Care | Payer: Medicare Other | Source: Ambulatory Visit | Attending: Obstetrics & Gynecology | Admitting: Obstetrics & Gynecology

## 2014-01-26 ENCOUNTER — Other Ambulatory Visit: Payer: Self-pay | Admitting: Obstetrics & Gynecology

## 2014-01-26 DIAGNOSIS — N63 Unspecified lump in unspecified breast: Secondary | ICD-10-CM

## 2014-01-26 HISTORY — PX: BREAST BIOPSY: SHX20

## 2014-02-06 ENCOUNTER — Encounter: Payer: Self-pay | Admitting: Internal Medicine

## 2014-02-07 ENCOUNTER — Telehealth: Payer: Self-pay | Admitting: *Deleted

## 2014-02-07 NOTE — Telephone Encounter (Signed)
Received referral from [facility].  Obtained appt from Dr. Burr Medico.  Called pt and confirmed 02/08/14 appt w/ her and her daughter Arna Snipe.   Unable to mail before appt letter - gave verbal.  Unable to mail welcoming packet - gave directions and instructions.  Unable to mail intake form - placed a note for one to be given at time of check in.  Made Keisha aware for conference list.  Emailed Anderson Malta at [place] to make her aware.  Gave Dr. Burr Medico a copy of the office note and took a copy to HIM to scan.

## 2014-02-08 ENCOUNTER — Encounter: Payer: Self-pay | Admitting: Hematology

## 2014-02-08 ENCOUNTER — Ambulatory Visit (HOSPITAL_BASED_OUTPATIENT_CLINIC_OR_DEPARTMENT_OTHER): Payer: Medicare Other | Admitting: Hematology

## 2014-02-08 ENCOUNTER — Ambulatory Visit: Payer: Medicare Other

## 2014-02-08 VITALS — BP 141/47 | HR 60 | Temp 97.8°F | Resp 18 | Ht 65.0 in | Wt 184.6 lb

## 2014-02-08 DIAGNOSIS — Z808 Family history of malignant neoplasm of other organs or systems: Secondary | ICD-10-CM

## 2014-02-08 DIAGNOSIS — C50412 Malignant neoplasm of upper-outer quadrant of left female breast: Secondary | ICD-10-CM

## 2014-02-08 DIAGNOSIS — C50312 Malignant neoplasm of lower-inner quadrant of left female breast: Secondary | ICD-10-CM

## 2014-02-08 DIAGNOSIS — Z170 Estrogen receptor positive status [ER+]: Secondary | ICD-10-CM

## 2014-02-08 DIAGNOSIS — C50012 Malignant neoplasm of nipple and areola, left female breast: Secondary | ICD-10-CM

## 2014-02-08 DIAGNOSIS — Z8589 Personal history of malignant neoplasm of other organs and systems: Secondary | ICD-10-CM

## 2014-02-08 DIAGNOSIS — Z803 Family history of malignant neoplasm of breast: Secondary | ICD-10-CM

## 2014-02-08 DIAGNOSIS — C50912 Malignant neoplasm of unspecified site of left female breast: Secondary | ICD-10-CM | POA: Insufficient documentation

## 2014-02-08 MED ORDER — ANASTROZOLE 1 MG PO TABS: 1.0000 mg | ORAL_TABLET | Freq: Every day | ORAL | Status: DC

## 2014-02-08 NOTE — Progress Notes (Deleted)
[place]  [phone] [phone]  Clinic New Consult Note   Patient Care Team: Biagio Borg, MD as PCP - General (Internal Medicine) Eulis Manly. Gershon Crane, MD as Attending Physician (Ophthalmology) 02/08/2014  CHIEF COMPLAINTS/PURPOSE OF CONSULTATION:  ***  HISTORY OF PRESENTING ILLNESS:  Doris Zamora 80 y.o. female is here because of ***  MEDICAL HISTORY:  Past Medical History  Diagnosis Date  . ANXIETY   . HYPERLIPIDEMIA   . INSOMNIA-SLEEP DISORDER-UNSPEC   . WEIGHT LOSS   . Endometrial cancer 2007    SURGICAL HISTORY: Past Surgical History  Procedure Laterality Date  . Abdominal hysterectomy  2007  . Oophorectomy  2007  . Thyroidectomy, partial  1983    SOCIAL HISTORY: History   Social History  . Marital Status: Widowed    Spouse Name: N/A    Number of Children: N/A  . Years of Education: N/A   Occupational History  . Not on file.   Social History Main Topics  . Smoking status: Former Smoker -- 50 years    Types: Cigarettes    Quit date: 02/10/1994  . Smokeless tobacco: Never Used  . Alcohol Use: 4.2 oz/week    7 Glasses of wine per week     Comment: 2 0z. HS to help her sleep  . Drug Use: No     Comment: admiited to poat use of marjuana to help her sleep  . Sexual Activity: Not on file   Other Topics Concern  . 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 Diabetes Mother   . Muscular dystrophy Father     ALLERGIES:  is allergic to synthroid and pneumovax.  MEDICATIONS:  Current Outpatient Prescriptions  Medication Sig Dispense Refill  . acetaminophen (TYLENOL EX ST ARTHRITIS PAIN) 500 MG tablet Take 500 mg by mouth every 6 (six) hours as needed.    Marland Kitchen acetaminophen (TYLENOL) 650 MG CR tablet Take 1,300 mg by mouth every 8 (eight) hours as needed for pain.    . ALPHA-LIPOIC ACID 100  MG TABS Take by mouth daily.    Marland Kitchen ALPRAZolam (XANAX) 0.5 MG tablet Take 1 tablet (0.5 mg total) by mouth 3 (three) times daily as needed. 90 tablet 5  . CALCIUM-MAGNESUIUM-ZINC 333-133-8.3 MG TABS Take by mouth daily.    . cholecalciferol (VITAMIN D) 1000 UNITS tablet Take 1,000 Units by mouth daily.    Marland Kitchen co-enzyme Q-10 30 MG capsule Take 100 mg by mouth daily.     . Multiple Vitamins-Minerals (MULTIVITAMIN WITH MINERALS) tablet Take 1 tablet by mouth daily. Forward Gold    . Omega-3 Fatty Acids (FISH OIL) 1000 MG CPDR Take by mouth daily.    Marland Kitchen tiZANidine (ZANAFLEX) 4 MG tablet Take 1 tablet (4 mg total) by mouth every 6 (six) hours as needed for muscle spasms. 40 tablet 1  . zolpidem (AMBIEN) 5 MG tablet take 1 tablet by mouth at bedtime if needed for sleep 30 tablet 5   No current facility-administered medications for this visit.    REVIEW OF SYSTEMS:   Constitutional: Denies fevers, chills or abnormal night sweats Eyes: Denies blurriness of vision, double vision or watery eyes Ears, nose, mouth, throat, and face: Denies mucositis or sore throat Respiratory: Denies cough, dyspnea or wheezes Cardiovascular: Denies palpitation, chest discomfort or lower  extremity swelling Gastrointestinal:  Denies nausea, heartburn or change in bowel habits Skin: Denies abnormal skin rashes Lymphatics: Denies new lymphadenopathy or easy bruising Neurological:Denies numbness, tingling or new weaknesses Behavioral/Psych: Mood is stable, no new changes  All other systems were reviewed with the patient and are negative.  PHYSICAL EXAMINATION: ECOG PERFORMANCE STATUS: {CHL ONC ECOG [phone]}  There were no vitals filed for this visit. There were no vitals filed for this visit.  GENERAL:alert, no distress and comfortable SKIN: skin color, texture, turgor are normal, no rashes or significant lesions EYES: normal, conjunctiva are pink and non-injected, sclera clear OROPHARYNX:no exudate, no erythema  and lips, buccal mucosa, and tongue normal  NECK: supple, thyroid normal size, non-tender, without nodularity LYMPH:  no palpable lymphadenopathy in the cervical, axillary or inguinal LUNGS: clear to auscultation and percussion with normal breathing effort HEART: regular rate & rhythm and no murmurs and no lower extremity edema ABDOMEN:abdomen soft, non-tender and normal bowel sounds Musculoskeletal:no cyanosis of digits and no clubbing  PSYCH: alert & oriented x 3 with fluent speech NEURO: no focal motor/sensory deficits  LABORATORY DATA:  I have reviewed the data as listed Lab Results  Component Value Date   WBC 6.4 05/11/2013   HGB 13.4 05/11/2013   HCT 40.3 05/11/2013   MCV 93.4 05/11/2013   PLT 235.0 05/11/2013    Recent Labs  05/11/13 1017 05/12/13 1206  NA 137  --   K 4.4  --   CL 101  --   CO2 30  --   GLUCOSE 76  --   BUN 14  --   CREATININE 0.7  --   CALCIUM 9.3  --   PROT  --  8.2  ALBUMIN  --  3.8  AST  --  39*  ALT  --  38*  ALKPHOS  --  70  BILITOT  --  0.6  BILIDIR  --  0.1   PATHOLOGY REPORT Diagnosis 1. Breast, left, needle core biopsy, mass, lower inner, 6:30 o'clock - INVASIVE MAMMARY CARCINOMA. - SEE COMMENT. 2. Breast, left, needle core biopsy, mass, retroareolar - INVASIVE MAMMARY CARCINOMA. - MAMMARY CARCINOMA IN SITU. - SEE COMMENT. 3. Breast, left, needle core biopsy, mass, 2:30 o'clock - INVASIVE MAMMARY CARCINOMA. - MAMMARY CARCINOMA IN SITU WITH CALCIFICATIONS. - SEE COMMENT. Microscopic Comment 1. The carcinoma is grade I and has some lobular features. 2. The carcinoma on part 2 is morphologically identical to that in part 1. 3. The carcinoma appears grade II and is likely a ductal phenotype. Breast prognostic profile will be performed on parts 1 and 3 and the results reported separately. The results were called to [facility] on 01/27/14.  Estrogen Receptor: 100%, POSITIVE, STRONG STAINING  INTENSITY Progesterone Receptor: 100%, POSITIVE, STRONG STAINING INTENSITY Proliferation Marker Ki67: 29%  Results: HER-2/NEU BY CISH - NO AMPLIFICATION OF HER-2 DETECTED. RESULT RATIO OF HER2: CEP 17 SIGNALS 0.94 AVERAGE HER2 COPY NUMBER PER CELL 1.65   RADIOGRAPHIC STUDIES: I have personally reviewed the radiological images as listed and agreed with the findings in the report. Mm Digital Diagnostic Bilat  01/13/2014   CLINICAL DATA:  80 year old female with a palpable abnormality in the left breast. The patient states she noticed this area/ inversion of the left nipple approximately 2-3 weeks prior.  EXAM: DIGITAL DIAGNOSTIC  BILATERAL MAMMOGRAM WITH CAD  ULTRASOUND LEFT BREAST  COMPARISON:  None.  ACR Breast Density Category b: There are scattered areas of fibroglandular density.  FINDINGS: No suspicious  masses or calcifications are seen in the right breast.  The left nipple is inverted. Spot compression tangential view over the palpable site of concern in the retroareolar left breast was performed demonstrating an irregular mass with associated microcalcifications measuring approximately 1.7 cm as well as an additional irregular mass in the retroareolar left breast with associated microcalcifications measuring approximately 1 cm.  There is an irregular mass in the upper-outer left breast Additional suspicious pleomorphic calcifications are seen throughout the upper-outer left breast spanning a distance of approximately 7.5 mm, confirmed on the additional spot compression magnification views.  Mammographic images were processed with CAD.  Physical examination of the subareolar/slightly inferior left breast reveals a palpable firm mass with associated inversion of the left nipple.  Targeted ultrasound of the left breast was performed demonstrating an irregular hypoechoic mass/area of distortion at 6 o'clock 3 cm from nipple measuring 0.8 x 0.9 x 0.9 cm. An additional mass in the subareolar left  breast measures 0.7 x 0.5 x 0.5 cm. A suspicious mass in the upper-outer left breast at 2 o'clock 4 cm from the nipple measures 0.8 x 0.9 x 0.6 cm. These masses correspond with mammography findings.  No definite lymphadenopathy seen in the left axilla.  IMPRESSION: 1. Three highly suspicious masses in the left breast, at 6 o'clock, subareolar, and in the upper-outer left breast at 2 o'clock with associated microcalcifications and nipple inversion.  2. Highly suspicious calcifications in the upper-outer left breast spanning a distance of approximately 7.5 cm.  RECOMMENDATION: 1. Ultrasound-guided biopsy of the mass in the left breast at 6 o'clock as well as the mass in the upper-outer left breast is recommended (given these masses are approximately 7-8 cm apart and this will help determine extent of disease).  2. Depending on the results of the ultrasound-guided biopsies, stereotactic guided biopsy of the suspicious calcifications in the upper-outer left breast may be performed.  The patient will be scheduled for ultrasound-guided biopsies for the mass in the left breast at 6 o'clock as well as the mass in the upper-outer left breast, and depending on the results of these biopsies a stereotactic biopsy can subsequently be scheduled.  I have discussed the findings and recommendations with the patient. Results were also provided in writing at the conclusion of the visit. If applicable, a reminder letter will be sent to the patient regarding the next appointment.  BI-RADS CATEGORY  5: Highly suggestive of malignancy.   Electronically Signed   By: Everlean Alstrom M.D.   On: 01/13/2014 14:12   Mm Digital Diagnostic Unilat L  01/26/2014   CLINICAL DATA:  80 year old female - evaluate clip placement following 3 separate ultrasound-guided left breast biopsies.  EXAM: DIAGNOSTIC LEFT MAMMOGRAM POST ULTRASOUND BIOPSY  COMPARISON:  Previous exams  FINDINGS: Mammographic images were obtained following ultrasound guided  biopsies of masses at the 6:30 position of the left breast, retroareolar left breast and 2 o'clock position of the left breast.  Coil shaped clips are identified corresponding to the masses at the 2 o'clock and 6:30 positions, and separated by a distance of 7 cm.  A heart/paper clip shaped tissue marker corresponds to the retroareolar mass.  All clips are in satisfactory position.  IMPRESSION: Satisfactory position of biopsy clips following ultrasound-guided left breast biopsies.  Final Assessment: Post Procedure Mammograms for Marker Placement   Electronically Signed   By: Hassan Rowan M.D.   On: 01/26/2014 12:35   US Breast Ltd Uni Left Inc Axilla  01/13/2014   CLINICAL  DATA:  80 year old female with a palpable abnormality in the left breast. The patient states she noticed this area/ inversion of the left nipple approximately 2-3 weeks prior.  EXAM: DIGITAL DIAGNOSTIC  BILATERAL MAMMOGRAM WITH CAD  ULTRASOUND LEFT BREAST  COMPARISON:  None.  ACR Breast Density Category b: There are scattered areas of fibroglandular density.  FINDINGS: No suspicious masses or calcifications are seen in the right breast.  The left nipple is inverted. Spot compression tangential view over the palpable site of concern in the retroareolar left breast was performed demonstrating an irregular mass with associated microcalcifications measuring approximately 1.7 cm as well as an additional irregular mass in the retroareolar left breast with associated microcalcifications measuring approximately 1 cm.  There is an irregular mass in the upper-outer left breast Additional suspicious pleomorphic calcifications are seen throughout the upper-outer left breast spanning a distance of approximately 7.5 mm, confirmed on the additional spot compression magnification views.  Mammographic images were processed with CAD.  Physical examination of the subareolar/slightly inferior left breast reveals a palpable firm mass with associated inversion of the  left nipple.  Targeted ultrasound of the left breast was performed demonstrating an irregular hypoechoic mass/area of distortion at 6 o'clock 3 cm from nipple measuring 0.8 x 0.9 x 0.9 cm. An additional mass in the subareolar left breast measures 0.7 x 0.5 x 0.5 cm. A suspicious mass in the upper-outer left breast at 2 o'clock 4 cm from the nipple measures 0.8 x 0.9 x 0.6 cm. These masses correspond with mammography findings.  No definite lymphadenopathy seen in the left axilla.  IMPRESSION: 1. Three highly suspicious masses in the left breast, at 6 o'clock, subareolar, and in the upper-outer left breast at 2 o'clock with associated microcalcifications and nipple inversion.  2. Highly suspicious calcifications in the upper-outer left breast spanning a distance of approximately 7.5 cm.  RECOMMENDATION: 1. Ultrasound-guided biopsy of the mass in the left breast at 6 o'clock as well as the mass in the upper-outer left breast is recommended (given these masses are approximately 7-8 cm apart and this will help determine extent of disease).  2. Depending on the results of the ultrasound-guided biopsies, stereotactic guided biopsy of the suspicious calcifications in the upper-outer left breast may be performed.  The patient will be scheduled for ultrasound-guided biopsies for the mass in the left breast at 6 o'clock as well as the mass in the upper-outer left breast, and depending on the results of these biopsies a stereotactic biopsy can subsequently be scheduled.  I have discussed the findings and recommendations with the patient. Results were also provided in writing at the conclusion of the visit. If applicable, a reminder letter will be sent to the patient regarding the next appointment.  BI-RADS CATEGORY  5: Highly suggestive of malignancy.   Electronically Signed   By: Everlean Alstrom M.D.   On: 01/13/2014 14:12   [place] Lt Breast Bx W Loc Dev 1st Lesion Img Bx Spec US Guide  01/27/2014   ADDENDUM REPORT:  01/27/2014 15:38  ADDENDUM: PATHOLOGY ADDENDUM:  Pathology:  1. Left breast, 6:30: Invasive mammary carcinoma with lobular features. 2. Left breast, retroareolar: Invasive mammary carcinoma with lobular features. 3. Left breast, 2:30: Invasive mammary carcinoma, likely ductal phenotype.  Pathology concordance with imaging findings: Yes.  Recommendation: Surgical consultation is recommended. MRI may be considered for evaluation of the contralateral breast. The patient has an appointment with Dr. Marlou Starks on 02/07/2014 at 8:00 AM.  At the request of the patient,  I spoke with her by telephone on 01/27/2014 at 15:20. She reports doing well after the biopsies.   Electronically Signed   By: Pamelia Hoit M.D.   On: 01/27/2014 15:38   01/26/2014   ADDENDUM REPORT: 01/26/2014 11:30  ADDENDUM: The report should read the mass as being located at the 6:00-630 position of the left breast.   Electronically Signed   By: Hassan Rowan M.D.   On: 01/26/2014 11:30   01/27/2014   CLINICAL DATA:  80 year old female with 3 separate suspicious left breast masses. For tissue sampling of 9 mm mass at the 6 o'clock position of the left breast 3 cm from the nipple.  EXAM: ULTRASOUND GUIDED LEFT BREAST CORE NEEDLE BIOPSY  COMPARISON:  Previous exams.  FINDINGS: I met with the patient and we discussed the procedure of ultrasound-guided biopsy, including benefits and alternatives. We discussed the high likelihood of a successful procedure. We discussed the risks of the procedure, including infection, bleeding, tissue injury, clip migration, and inadequate sampling. Informed written consent was given. The usual time-out protocol was performed immediately prior to the procedure.  Using sterile technique and 2% Lidocaine as local anesthetic, under direct ultrasound visualization, a 12 gauge spring-loaded device was used to perform biopsy of the 9 mm irregular hypoechoic mass at the 6 o'clock position of the left breast 3 cm from the nipple using a medial  approach. At the conclusion of the procedure a coil shaped tissue marker clip was deployed into the biopsy cavity. Follow up 2 view mammogram was performed and dictated separately.  IMPRESSION: Ultrasound guided biopsy of mass at the 6 o'clock position of the left breast. No apparent complications.  Pathology will be followed.  Electronically Signed: By: Hassan Rowan M.D. On: 01/26/2014 11:22   [place] Lt Breast Bx W Loc Dev Ea Add Lesion Img Bx Spec US Guide  01/26/2014   CLINICAL DATA:  80 year old female with 3 separate suspicious left breast masses. For tissue sampling of 9 mm mass at the 2 o'clock position of the left breast.  EXAM: ULTRASOUND GUIDED LEFT BREAST CORE NEEDLE BIOPSY  COMPARISON:  Previous exams.  FINDINGS: I met with the patient and we discussed the procedure of ultrasound-guided biopsy, including benefits and alternatives. We discussed the high likelihood of a successful procedure. We discussed the risks of the procedure, including infection, bleeding, tissue injury, clip migration, and inadequate sampling. Informed written consent was given. The usual time-out protocol was performed immediately prior to the procedure.  Using sterile technique and 2% Lidocaine as local anesthetic, under direct ultrasound visualization, a 14 gauge spring-loaded device was used to perform biopsy of the 9 mm irregular hypoechoic mass at the 2 o'clock position of the left breast 4 cm from the nipple using a superomedial approach. At the conclusion of the procedure a coil shaped tissue marker clip was deployed into the biopsy cavity. Follow up 2 view mammogram was performed and dictated separately.  IMPRESSION: Ultrasound guided biopsy of mass at the 2 o'clock position of the left breast. No apparent complications.  Pathology will be followed.   Electronically Signed   By: Hassan Rowan M.D.   On: 01/26/2014 11:29   [place] Lt Breast Bx W Loc Dev Ea Add Lesion Img Bx Spec US Guide  01/26/2014   CLINICAL DATA:  80 year old  female with 3 separate suspicious left breast masses. For tissue sampling of 6 mm retroareolar left breast mass.  EXAM: ULTRASOUND GUIDED LEFT BREAST CORE NEEDLE BIOPSY  COMPARISON:  Previous exams.  FINDINGS: I met with the patient and we discussed the procedure of ultrasound-guided biopsy, including benefits and alternatives. We discussed the high likelihood of a successful procedure. We discussed the risks of the procedure, including infection, bleeding, tissue injury, clip migration, and inadequate sampling. Informed written consent was given. The usual time-out protocol was performed immediately prior to the procedure.  Using sterile technique and 2% Lidocaine as local anesthetic, under direct ultrasound visualization, a 12 gauge automated needle device was used to perform biopsy of the 6 mm irregular hypoechoic shadowing mass in the retroareolar left breast using a medial approach. At the conclusion of the procedure a heart/paper clip shaped tissue marker clip was deployed into the biopsy cavity. Follow up 2 view mammogram was performed and dictated separately.  IMPRESSION: Ultrasound guided biopsy of retroareolar left breast mass. No apparent complications.  Pathology will be followed.   Electronically Signed   By: Hassan Rowan M.D.   On: 01/26/2014 11:23    ASSESSMENT & PLAN:  *** No orders of the defined types were placed in this encounter.    All questions were answered. The patient knows to call the clinic with any problems, questions or concerns. I spent {CHL ONC TIME VISIT - DBZMC:[id number]} counseling the patient face to face. The total time spent in the appointment was {CHL ONC TIME VISIT - ESLPN:[id number]} and more than 50% was on counseling.     Truitt Merle, MD 02/08/2014 8:41 AM

## 2014-02-08 NOTE — Progress Notes (Signed)
[place]  [phone]) [phone] Fax:([phone]  Clinic New Consult Note   Patient Care Team: Biagio Borg, MD as PCP - General (Internal Medicine) Eulis Manly. Gershon Crane, MD as Attending Physician (Ophthalmology) Autumn Messing III, MD as Consulting Physician (General Surgery) Truitt Merle, MD as Consulting Physician (Hematology) 02/09/2014  CHIEF COMPLAINTS/PURPOSE OF CONSULTATION:  Newly diagnosed left breast cancer  Oncology History   Breast cancer, left breast   Staging form: Breast, AJCC 7th Edition     Clinical: T1c, N0 - Unsigned Endometrial cancer   Staging form: Corpus Uteri - Adenosarcoma, AJCC 7th Edition     Clinical: T1c, N0 - Unsigned       Endometrial cancer   08/12/2005 Initial Diagnosis Endometrial cancer, T1bN0, s/p hysrectomy     Breast cancer, left breast   01/13/2014 Imaging mammogram and US showed 3 masses at 6 o'clock, retroareolar and 2 o'clock area, measuring 0.7-1.7cm.    01/26/2014 Initial Diagnosis Breast cancer, left breast, multifocal (3 lesions, biopsy showed 2 similar lesions with lobular features, and the third lesion has ductal features).      HISTORY OF PRESENTING ILLNESS:  Doris Zamora 80 y.o. female is here because of left breast cancer.  She noticed left breast mass and nipple inversion about months ago. No tenderness or nipple discharge. She otherwise feels well. No change of appetite and weight. ROS (+) indigestion but otherwise negative.  She underwent mammogram and [place] which showed 3 masses at the 6:00, retroareola, and 2:00. The masses measures 1-1.7 cm by mammogram, 0.7-0.9 cm by ultrasound. She underwent subsequent biopsy of this 3 lesions which showed invasive mammary carcinoma, 2 of them showed similar morphology and lobular features, one showed ductal features. Both are ER and PR positive.  She tolerated the biopsy well without complications. She feels well in general, denies any pain or new symptoms. No change in her  appetite and weight recently. She was seen by breast surgeon Dr. Marlou Starks yesterday, who recommended mastectomy.  MEDICAL HISTORY:  Past Medical History  Diagnosis Date  . ANXIETY   . HYPERLIPIDEMIA   . INSOMNIA-SLEEP DISORDER-UNSPEC   . WEIGHT LOSS   . Endometrial cancer 2007    SURGICAL HISTORY: Past Surgical History  Procedure Laterality Date  . Abdominal hysterectomy  2007  . Oophorectomy  2007  . Thyroidectomy, partial  1983   GYN HISTORY  Menarchal: 14 LMP: age of 78 Contraceptive: 1 yr  HRT: no  G3P3:   SOCIAL HISTORY: History   Social History  . Marital Status: Widowed    Spouse Name: N/A    Number of Children: 3  . Years of Education: N/A   Occupational History  . Not on file.   Social History Main Topics  . Smoking status: Former Smoker -- 50 years    Types: Cigarettes    Quit date: 02/10/1994  . Smokeless tobacco: Never Used  . Alcohol Use: 4.2 oz/week    7 Glasses of wine per week     Comment: 2 0z. HS to help her sleep  . Drug Use: No     Comment: admiited to poat use of marjuana to help her sleep  . Sexual Activity: Not on file   Other Topics Concern  . 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 Diabetes Mother   .  Muscular dystrophy Father   . Cancer Sister 85    pancreatic cancer   . Cancer Other 40    breast cancer     ALLERGIES:  is allergic to synthroid and pneumovax.  MEDICATIONS:  Current Outpatient Prescriptions  Medication Sig Dispense Refill  . acetaminophen (TYLENOL EX ST ARTHRITIS PAIN) 500 MG tablet Take 500 mg by mouth every 6 (six) hours as needed.    Marland Kitchen acetaminophen (TYLENOL) 650 MG CR tablet Take 1,300 mg by mouth every 8 (eight) hours as needed for pain.    . ALPHA-LIPOIC ACID 100 MG TABS Take by mouth daily.    Marland Kitchen ALPRAZolam (XANAX) 0.5 MG tablet Take 1 tablet (0.5 mg total) by mouth 3 (three)  times daily as needed. 90 tablet 5        . CALCIUM-MAGNESUIUM-ZINC 333-133-8.3 MG TABS Take by mouth daily.    . cholecalciferol (VITAMIN D) 1000 UNITS tablet Take 1,000 Units by mouth daily.    Marland Kitchen co-enzyme Q-10 30 MG capsule Take 100 mg by mouth daily.     . Multiple Vitamins-Minerals (MULTIVITAMIN WITH MINERALS) tablet Take 1 tablet by mouth daily. Forward Gold    . Omega-3 Fatty Acids (FISH OIL) 1000 MG CPDR Take by mouth daily.    Marland Kitchen tiZANidine (ZANAFLEX) 4 MG tablet Take 1 tablet (4 mg total) by mouth every 6 (six) hours as needed for muscle spasms. 40 tablet 1  . zolpidem (AMBIEN) 5 MG tablet take 1 tablet by mouth at bedtime if needed for sleep 30 tablet 5   No current facility-administered medications for this visit.    REVIEW OF SYSTEMS:   Constitutional: Denies fevers, chills or abnormal night sweats Eyes: Denies blurriness of vision, double vision or watery eyes Ears, nose, mouth, throat, and face: Denies mucositis or sore throat Respiratory: Denies cough, dyspnea or wheezes Cardiovascular: Denies palpitation, chest discomfort or lower extremity swelling Gastrointestinal:  Denies nausea, heartburn or change in bowel habits Skin: Denies abnormal skin rashes Lymphatics: Denies new lymphadenopathy or easy bruising Neurological:Denies numbness, tingling or new weaknesses Behavioral/Psych: Mood is stable, no new changes  All other systems were reviewed with the patient and are negative.  PHYSICAL EXAMINATION: ECOG PERFORMANCE STATUS: 0 - Asymptomatic  Filed Vitals:   02/08/14 1056  BP: 141/47  Pulse: 60  Temp: 97.8 F (36.6 C)  Resp: 18   Filed Weights   02/08/14 1056  Weight: 184 lb 9.6 oz (83.734 kg)    GENERAL:alert, no distress and comfortable SKIN: skin color, texture, turgor are normal, no rashes or significant lesions EYES: normal, conjunctiva are pink and non-injected, sclera clear OROPHARYNX:no exudate, no erythema and lips, buccal mucosa, and tongue normal   NECK: supple, thyroid normal size, non-tender, without nodularity LYMPH:  no palpable lymphadenopathy in the cervical, axillary or inguinal LUNGS: clear to auscultation and percussion with normal breathing effort HEART: regular rate & rhythm and no murmurs and no lower extremity edema ABDOMEN:abdomen soft, non-tender and normal bowel sounds Musculoskeletal:no cyanosis of digits and no clubbing  PSYCH: alert & oriented x 3 with fluent speech NEURO: no focal motor/sensory deficits Breasts: Breast inspection showed them to be symmetrical, (+) left nipple inversion. There is a 2 x 3.5 cm mass inferior to her left breast nipple, no other palpable mass in the left breast or right breast. Palpation of the bilateral  axilla revealed no obvious mass that I could appreciate.  LABORATORY DATA:  I have reviewed the data as listed Lab Results  Component Value  Date   WBC 6.4 05/11/2013   HGB 13.4 05/11/2013   HCT 40.3 05/11/2013   MCV 93.4 05/11/2013   PLT 235.0 05/11/2013    Recent Labs  05/11/13 1017 05/12/13 1206  NA 137  --   K 4.4  --   CL 101  --   CO2 30  --   GLUCOSE 76  --   BUN 14  --   CREATININE 0.7  --   CALCIUM 9.3  --   PROT  --  8.2  ALBUMIN  --  3.8  AST  --  39*  ALT  --  38*  ALKPHOS  --  70  BILITOT  --  0.6  BILIDIR  --  0.1   PATHOLOGY REPORT Diagnosis 1. Breast, left, needle core biopsy, mass, lower inner, 6:30 o'clock - INVASIVE MAMMARY CARCINOMA. - SEE COMMENT. 2. Breast, left, needle core biopsy, mass, retroareolar - INVASIVE MAMMARY CARCINOMA. - MAMMARY CARCINOMA IN SITU. - SEE COMMENT. 3. Breast, left, needle core biopsy, mass, 2:30 o'clock - INVASIVE MAMMARY CARCINOMA. - MAMMARY CARCINOMA IN SITU WITH CALCIFICATIONS. - SEE COMMENT. Microscopic Comment 1. The carcinoma is grade I and has some lobular features. 2. The carcinoma on part 2 is morphologically identical to that in part 1. 3. The carcinoma appears grade II and is likely a ductal  phenotype. Breast prognostic profile will be performed on parts 1 and 3 and the results reported separately. The results were called to [facility] on 01/27/14.  Estrogen Receptor: 100%, POSITIVE, STRONG STAINING INTENSITY Progesterone Receptor: 100%, POSITIVE, STRONG STAINING INTENSITY Proliferation Marker Ki67: 29%  Results: HER-2/NEU BY CISH - NO AMPLIFICATION OF HER-2 DETECTED. RESULT RATIO OF HER2: CEP 17 SIGNALS 0.94 AVERAGE HER2 COPY NUMBER PER CELL 1.65   RADIOGRAPHIC STUDIES: I have personally reviewed the radiological images as listed and agreed with the findings in the report.  Mm Digital Diagnostic Bilat 01/13/2014    FINDINGS: No suspicious masses or calcifications are seen in the right breast.  The left nipple is inverted. Spot compression tangential view over the palpable site of concern in the retroareolar left breast was performed demonstrating an irregular mass with associated microcalcifications measuring approximately 1.7 cm as well as an additional irregular mass in the retroareolar left breast with associated microcalcifications measuring approximately 1 cm.  There is an irregular mass in the upper-outer left breast Additional suspicious pleomorphic calcifications are seen throughout the upper-outer left breast spanning a distance of approximately 7.5 mm, confirmed on the additional spot compression magnification views.  Mammographic images were processed with CAD.  Physical examination of the subareolar/slightly inferior left breast reveals a palpable firm mass with associated inversion of the left nipple.  Targeted ultrasound of the left breast was performed demonstrating an irregular hypoechoic mass/area of distortion at 6 o'clock 3 cm from nipple measuring 0.8 x 0.9 x 0.9 cm. An additional mass in the subareolar left breast measures 0.7 x 0.5 x 0.5 cm. A suspicious mass in the upper-outer left breast at 2 o'clock 4 cm from the nipple measures 0.8 x 0.9  x 0.6 cm. These masses correspond with mammography findings.  No definite lymphadenopathy seen in the left axilla.   IMPRESSION: 1. Three highly suspicious masses in the left breast, at 6 o'clock, subareolar, and in the upper-outer left breast at 2 o'clock with associated microcalcifications and nipple inversion.  2. Highly suspicious calcifications in the upper-outer left breast spanning a distance of approximately 7.5 cm.  ASSESSMENT & PLAN:  80 year old female with past medical history of stage I endometrial cancer, status post hysterectomy, anxiety and insomnia, who was recently diagnosed with left breast cancer.  1. Left breast cancer, 3 synchronized lesions, 2 of them with lobular features and one has ductal features. T1b-1cN0M0, stage I, ER+/PR+/HER2- -I reviewed her breast mass biopsy findings and image findings with patient and her daughter extensively. She has 3 synchronized early stage breast cancer without clinical lymph nodes involvement. I discussed that surgical resection, likely mastectomy due to the location of this 3 masses, would be the only curative treatment for her breast cancer. However she is very hesitated to have surgery up front due to the hospitalization and potential complications. -Giving her lobular features, strong ER/PR positivity, I recommend endocrine therapy, i.e. aromatase inhibitor, in the setting of neoadjuvant or adjuvant. Giving her unwillingness to have upfront surgery, I recommend her to start aromatase inhibitor, such as anastrozole as soon as possible. The potential side effects of hot flash, vaginal dryness, musculoskeletal pain and stiffness, osteoporosis, were explained to patient. She voiced good understanding and agreed to proceed. A prescription was sent to her pharmacy. -I we'll repeat a mammogram and ultrasound after 3-6 months of hormonal therapy, to evaluate her response to treatment. I encouraged her to reconsider surgery after neoadjuvant  therapy. I discussed that hormonal therapy alone is not curative for her breast cancer. -After the long discussion, she agrees to the above, and we'll start anastrozole when she filled her prescription.  2. Genetics -She has personal history of endometrial cancer, family history of breast cancer and pancreatic cancer. I discussed the genetic screening for inherited breast cancer and lynch syndrome, she declined at this point.  She will continue to follow-up with her primary care physician for other medical issues.  Plan #1 she was started anastrozole in a neoadjuvant setting #2 inverted her response in 3-6 months #3 return to my clinic in 1 month's for follow-up.    Orders Placed This Encounter  Procedures  . DG Bone Density    Standing Status: Future     Number of Occurrences:      Standing Expiration Date: 02/08/2015    Order Specific Question:  Reason for Exam (SYMPTOM  OR DIAGNOSIS REQUIRED)    Answer:  base line scan before breast cancer treatment    Order Specific Question:  Preferred imaging location?    Answer:  [facility]  . CBC with Differential    Standing Status: Standing     Number of Occurrences: 20     Standing Expiration Date: 02/09/2015  . Comprehensive metabolic panel (Cmet) - [facility]    Standing Status: Standing     Number of Occurrences: 20     Standing Expiration Date: 02/09/2015    All questions were answered. The patient knows to call the clinic with any problems, questions or concerns. I spent 40 minutes counseling the patient face to face. The total time spent in the appointment was 55 minutes and more than 50% was on counseling.     Truitt Merle, MD 02/09/2014 7:53 AM

## 2014-02-08 NOTE — Progress Notes (Signed)
Checked in new patient with no issues(3 insurances).I gave her [facility] packet, appt card and has not been traveling.

## 2014-02-09 ENCOUNTER — Encounter: Payer: Self-pay | Admitting: Hematology

## 2014-02-10 ENCOUNTER — Encounter: Payer: Self-pay | Admitting: Hematology

## 2014-02-21 ENCOUNTER — Telehealth: Payer: Self-pay | Admitting: Hematology

## 2014-02-21 NOTE — Telephone Encounter (Signed)
Pt daughter called and stated she hasn't heard anything back from appt or bone density scan. I was able to sch bone scan at [facility] when spoke with [facility] they transfered stating they sch Bone survey at [facility]. Pt confirm both appt. Mailed cal.

## 2014-02-24 ENCOUNTER — Ambulatory Visit (HOSPITAL_COMMUNITY)
Admission: RE | Admit: 2014-02-24 | Discharge: 2014-02-24 | Disposition: A | Discharge status: Home / Self Care | Payer: Medicare Other | Source: Ambulatory Visit | Attending: Hematology | Admitting: Hematology

## 2014-02-24 DIAGNOSIS — C50912 Malignant neoplasm of unspecified site of left female breast: Secondary | ICD-10-CM

## 2014-02-24 DIAGNOSIS — Z13820 Encounter for screening for osteoporosis: Secondary | ICD-10-CM | POA: Insufficient documentation

## 2014-02-24 DIAGNOSIS — Z780 Asymptomatic menopausal state: Secondary | ICD-10-CM | POA: Diagnosis not present

## 2014-03-02 ENCOUNTER — Telehealth: Payer: Self-pay | Admitting: *Deleted

## 2014-03-02 NOTE — Telephone Encounter (Signed)
Left message with Fraser Din at pt's home to have pt call us for results of DXA scan.  She has osteopenia & need to make sure she is taking calcium, MVI & Vit D.

## 2014-03-03 ENCOUNTER — Telehealth: Payer: Self-pay | Admitting: *Deleted

## 2014-03-03 NOTE — Telephone Encounter (Signed)
Spoke with pt and informed pt re:  Bone density results showed Osteopenia.  Instructed pt to continue taking Calcium, Multivitamin, and Vitamin D as per Dr. Ernestina Penna instructions.  Gave pt appts date and time for follow up 03/09/14.   Pt voiced understanding.

## 2014-03-09 ENCOUNTER — Other Ambulatory Visit (HOSPITAL_BASED_OUTPATIENT_CLINIC_OR_DEPARTMENT_OTHER): Payer: Medicare Other

## 2014-03-09 ENCOUNTER — Telehealth: Payer: Self-pay | Admitting: Hematology

## 2014-03-09 ENCOUNTER — Ambulatory Visit (HOSPITAL_BASED_OUTPATIENT_CLINIC_OR_DEPARTMENT_OTHER): Payer: Medicare Other | Admitting: Hematology

## 2014-03-09 VITALS — BP 152/58 | HR 66 | Temp 98.1°F | Resp 18 | Ht 65.0 in | Wt 184.9 lb

## 2014-03-09 DIAGNOSIS — C50912 Malignant neoplasm of unspecified site of left female breast: Secondary | ICD-10-CM

## 2014-03-09 DIAGNOSIS — Z8542 Personal history of malignant neoplasm of other parts of uterus: Secondary | ICD-10-CM

## 2014-03-09 DIAGNOSIS — Z800 Family history of malignant neoplasm of digestive organs: Secondary | ICD-10-CM

## 2014-03-09 DIAGNOSIS — Z803 Family history of malignant neoplasm of breast: Secondary | ICD-10-CM

## 2014-03-09 DIAGNOSIS — Z79811 Long term (current) use of aromatase inhibitors: Secondary | ICD-10-CM

## 2014-03-09 LAB — COMPREHENSIVE METABOLIC PANEL (CC13)
ALBUMIN: 3.3 g/dL — AB (ref 3.5–5.0)
ALT: 23 U/L (ref 0–55)
AST: 29 U/L (ref 5–34)
Alkaline Phosphatase: 66 U/L (ref 40–150)
Anion Gap: 11 mEq/L (ref 3–11)
BILIRUBIN TOTAL: 0.54 mg/dL (ref 0.20–1.20)
BUN: 14 mg/dL (ref 7.0–26.0)
CALCIUM: 9 mg/dL (ref 8.4–10.4)
CO2: 27 meq/L (ref 22–29)
Chloride: 104 mEq/L (ref 98–109)
Creatinine: 0.8 mg/dL (ref 0.6–1.1)
EGFR: 77 mL/min/{1.73_m2} — ABNORMAL LOW (ref >90)
Glucose: 151 mg/dl — ABNORMAL HIGH (ref 70–140)
Potassium: 4.2 mEq/L (ref 3.5–5.1)
Sodium: 141 mEq/L (ref 136–145)
Total Protein: 7.2 g/dL (ref 6.4–8.3)

## 2014-03-09 LAB — CBC WITH DIFFERENTIAL/PLATELET
BASO%: 0.8 % (ref 0.0–2.0)
BASOS ABS: 0 10*3/uL (ref 0.0–0.1)
EOS ABS: 0 10*3/uL (ref 0.0–0.5)
EOS%: 0.4 % (ref 0.0–7.0)
HCT: 40.4 % (ref 34.8–46.6)
HGB: 13.1 g/dL (ref 11.6–15.9)
LYMPH%: 24.9 % (ref 14.0–49.7)
MCH: 30.7 pg (ref 25.1–34.0)
MCHC: 32.4 g/dL (ref 31.5–36.0)
MCV: 94.6 fL (ref 79.5–101.0)
MONO#: 0.4 10*3/uL (ref 0.1–0.9)
MONO%: 7.3 % (ref 0.0–14.0)
NEUT%: 66.6 % (ref 38.4–76.8)
NEUTROS ABS: 3.5 10*3/uL (ref 1.5–6.5)
PLATELETS: 203 10*3/uL (ref 145–400)
RBC: 4.27 10*6/uL (ref 3.70–5.45)
RDW: 13.6 % (ref 11.2–14.5)
WBC: 5.2 10*3/uL (ref 3.9–10.3)
lymph#: 1.3 10*3/uL (ref 0.9–3.3)

## 2014-03-09 NOTE — Telephone Encounter (Signed)
Gave avs & calendar for March. Sent message to add order for MRI & Mammo.

## 2014-03-09 NOTE — Progress Notes (Signed)
[place]  Telephone:(336) [phone] Fax:(336) [phone]  Clinic New Consult Note   Patient Care Team: Biagio Borg, MD as PCP - General (Internal Medicine) Eulis Manly. Gershon Crane, MD as Attending Physician (Ophthalmology) Autumn Messing III, MD as Consulting Physician (General Surgery) Truitt Merle, MD as Consulting Physician (Hematology) 03/09/2014  CHIEF COMPLAINT Follow-up breast cancer  Oncology History   Breast cancer, left breast   Staging form: Breast, AJCC 7th Edition     Clinical: T1c, N0 - Unsigned Endometrial cancer   Staging form: Corpus Uteri - Adenosarcoma, AJCC 7th Edition     Clinical: T1c, N0 - Unsigned       Endometrial cancer   08/12/2005 Initial Diagnosis Endometrial cancer, T1bN0, s/p hysrectomy     Breast cancer, left breast   01/13/2014 Imaging mammogram and US showed 3 masses at 6 o'clock, retroareolar and 2 o'clock area, measuring 0.7-1.7cm.    01/26/2014 Initial Diagnosis Breast cancer, left breast, multifocal (3 lesions, biopsy showed 2 similar lesions with lobular features, and the third lesion has ductal features).    CURRENT THERAPY: Anastrozole 1 mg once daily, started on 02/10/2014.  HISTORY OF INITIAL PRESENTING ILLNESS:  Doris Zamora 81 y.o. female is here because of left breast cancer.  She noticed left breast mass and nipple inversion about months ago. No tenderness or nipple discharge. She otherwise feels well. No change of appetite and weight. ROS (+) indigestion but otherwise negative.  She underwent mammogram and [place] which showed 3 masses at the 6:00, retroareola, and 2:00. The masses measures 1-1.7 cm by mammogram, 0.7-0.9 cm by ultrasound. She underwent subsequent biopsy of this 3 lesions which showed invasive mammary carcinoma, 2 of them showed similar morphology and lobular features, one showed ductal features. Both are ER and PR positive.  She tolerated the biopsy well without complications. She feels well in general, denies any pain  or new symptoms. No change in her appetite and weight recently. She was seen by breast surgeon Dr. Marlou Starks yesterday, who recommended mastectomy.  INTERIM HISTORY: She started Arimidex one month ago. She takes before breakfast and has slight nausea. No other issues. She feels well overall. No hot flush, no new muscular/joint discomfort, she has chronic left knee pain.   MEDICAL HISTORY:  Past Medical History  Diagnosis Date  . ANXIETY   . HYPERLIPIDEMIA   . INSOMNIA-SLEEP DISORDER-UNSPEC   . WEIGHT LOSS   . Endometrial cancer 2007    SURGICAL HISTORY: Past Surgical History  Procedure Laterality Date  . Abdominal hysterectomy  2007  . Oophorectomy  2007  . Thyroidectomy, partial  1983   GYN HISTORY  Menarchal: 14 LMP: age of 28 Contraceptive: 1 yr  HRT: no  G3P3:   SOCIAL HISTORY: History   Social History  . Marital Status: Widowed    Spouse Name: N/A    Number of Children: 3  . Years of Education: N/A   Occupational History  . Not on file.   Social History Main Topics  . Smoking status: Former Smoker -- 50 years    Types: Cigarettes    Quit date: 02/10/1994  . Smokeless tobacco: Never Used  . Alcohol Use: 4.2 oz/week    7 Glasses of wine per week     Comment: 2 0z. HS to help her sleep  . Drug Use: No     Comment: admiited to poat use of marjuana to help her sleep  . Sexual Activity: Not on file   Other Topics Concern  .  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 Diabetes Mother   . Muscular dystrophy Father   . Cancer Sister 47    pancreatic cancer   . Cancer Other 40    breast cancer     ALLERGIES:  is allergic to synthroid and pneumovax.  MEDICATIONS:  Current Outpatient Prescriptions  Medication Sig Dispense Refill  . acetaminophen (TYLENOL EX ST ARTHRITIS PAIN) 500 MG tablet Take 500 mg by mouth every 6 (six) hours as  needed.    Marland Kitchen acetaminophen (TYLENOL) 650 MG CR tablet Take 1,300 mg by mouth every 8 (eight) hours as needed for pain.    . ALPHA-LIPOIC ACID 100 MG TABS Take by mouth daily.    Marland Kitchen ALPRAZolam (XANAX) 0.5 MG tablet Take 1 tablet (0.5 mg total) by mouth 3 (three) times daily as needed. 90 tablet 5        . CALCIUM-MAGNESUIUM-ZINC 333-133-8.3 MG TABS Take by mouth daily.    . cholecalciferol (VITAMIN D) 1000 UNITS tablet Take 1,000 Units by mouth daily.    Marland Kitchen co-enzyme Q-10 30 MG capsule Take 100 mg by mouth daily.     . Multiple Vitamins-Minerals (MULTIVITAMIN WITH MINERALS) tablet Take 1 tablet by mouth daily. Forward Gold    . Omega-3 Fatty Acids (FISH OIL) 1000 MG CPDR Take by mouth daily.    Marland Kitchen tiZANidine (ZANAFLEX) 4 MG tablet Take 1 tablet (4 mg total) by mouth every 6 (six) hours as needed for muscle spasms. 40 tablet 1  . zolpidem (AMBIEN) 5 MG tablet take 1 tablet by mouth at bedtime if needed for sleep 30 tablet 5   No current facility-administered medications for this visit.    REVIEW OF SYSTEMS:   Constitutional: Denies fevers, chills or abnormal night sweats Eyes: Denies blurriness of vision, double vision or watery eyes Ears, nose, mouth, throat, and face: Denies mucositis or sore throat Respiratory: Denies cough, dyspnea or wheezes Cardiovascular: Denies palpitation, chest discomfort or lower extremity swelling Gastrointestinal:  Denies nausea, heartburn or change in bowel habits Skin: Denies abnormal skin rashes Lymphatics: Denies new lymphadenopathy or easy bruising Neurological:Denies numbness, tingling or new weaknesses Behavioral/Psych: Mood is stable, no new changes  All other systems were reviewed with the patient and are negative.  PHYSICAL EXAMINATION: ECOG PERFORMANCE STATUS: 0 - Asymptomatic  Filed Vitals:   03/09/14 0957  BP: 152/58  Pulse: 66  Temp: 98.1 F (36.7 C)  Resp: 18   Filed Weights   03/09/14 0957  Weight: 184 lb 14.4 oz (83.87 kg)     GENERAL:alert, no distress and comfortable SKIN: skin color, texture, turgor are normal, no rashes or significant lesions EYES: normal, conjunctiva are pink and non-injected, sclera clear OROPHARYNX:no exudate, no erythema and lips, buccal mucosa, and tongue normal  NECK: supple, thyroid normal size, non-tender, without nodularity LYMPH:  no palpable lymphadenopathy in the cervical, axillary or inguinal LUNGS: clear to auscultation and percussion with normal breathing effort HEART: regular rate & rhythm and no murmurs and no lower extremity edema ABDOMEN:abdomen soft, non-tender and normal bowel sounds Musculoskeletal:no cyanosis of digits and no clubbing  PSYCH: alert & oriented x 3 with fluent speech NEURO: no focal motor/sensory deficits Breasts: Breast inspection showed them to be symmetrical, (+) left nipple inversion. There is a 2 x 3 cm mass inferior to her  left breast nipple, no other palpable mass in the left breast or right breast. Palpation of the bilateral  axilla revealed no obvious mass that I could appreciate.  LABORATORY DATA:  I have reviewed the data as listed Lab Results  Component Value Date   WBC 6.4 05/11/2013   HGB 13.4 05/11/2013   HCT 40.3 05/11/2013   MCV 93.4 05/11/2013   PLT 235.0 05/11/2013    Recent Labs  05/11/13 1017 05/12/13 1206 03/09/14 0920  NA 137  --  141  K 4.4  --  4.2  CL 101  --   --   CO2 30  --  27  GLUCOSE 76  --  151*  BUN 14  --  14.0  CREATININE 0.7  --  0.8  CALCIUM 9.3  --  9.0  PROT  --  8.2 7.2  ALBUMIN  --  3.8 3.3*  AST  --  39* 29  ALT  --  38* 23  ALKPHOS  --  70 66  BILITOT  --  0.6 0.54  BILIDIR  --  0.1  --    PATHOLOGY REPORT Diagnosis 1. Breast, left, needle core biopsy, mass, lower inner, 6:30 o'clock - INVASIVE MAMMARY CARCINOMA. - SEE COMMENT. 2. Breast, left, needle core biopsy, mass, retroareolar - INVASIVE MAMMARY CARCINOMA. - MAMMARY CARCINOMA IN SITU. - SEE COMMENT. 3. Breast, left,  needle core biopsy, mass, 2:30 o'clock - INVASIVE MAMMARY CARCINOMA. - MAMMARY CARCINOMA IN SITU WITH CALCIFICATIONS. - SEE COMMENT. Microscopic Comment 1. The carcinoma is grade I and has some lobular features. 2. The carcinoma on part 2 is morphologically identical to that in part 1. 3. The carcinoma appears grade II and is likely a ductal phenotype. Breast prognostic profile will be performed on parts 1 and 3 and the results reported separately. The results were called to [facility] on 01/27/14.  Estrogen Receptor: 100%, POSITIVE, STRONG STAINING INTENSITY Progesterone Receptor: 100%, POSITIVE, STRONG STAINING INTENSITY Proliferation Marker Ki67: 29%  Results: HER-2/NEU BY CISH - NO AMPLIFICATION OF HER-2 DETECTED. RESULT RATIO OF HER2: CEP 17 SIGNALS 0.94 AVERAGE HER2 COPY NUMBER PER CELL 1.65   RADIOGRAPHIC STUDIES: I have personally reviewed the radiological images as listed and agreed with the findings in the report.  Mm Digital Diagnostic Bilat 01/13/2014    FINDINGS: No suspicious masses or calcifications are seen in the right breast.  The left nipple is inverted. Spot compression tangential view over the palpable site of concern in the retroareolar left breast was performed demonstrating an irregular mass with associated microcalcifications measuring approximately 1.7 cm as well as an additional irregular mass in the retroareolar left breast with associated microcalcifications measuring approximately 1 cm.  There is an irregular mass in the upper-outer left breast Additional suspicious pleomorphic calcifications are seen throughout the upper-outer left breast spanning a distance of approximately 7.5 mm, confirmed on the additional spot compression magnification views.  Mammographic images were processed with CAD.  Physical examination of the subareolar/slightly inferior left breast reveals a palpable firm mass with associated inversion of the left nipple.   Targeted ultrasound of the left breast was performed demonstrating an irregular hypoechoic mass/area of distortion at 6 o'clock 3 cm from nipple measuring 0.8 x 0.9 x 0.9 cm. An additional mass in the subareolar left breast measures 0.7 x 0.5 x 0.5 cm. A suspicious mass in the upper-outer left breast at 2 o'clock 4 cm from the nipple measures 0.8 x 0.9 x 0.6 cm. These  masses correspond with mammography findings.  No definite lymphadenopathy seen in the left axilla.   IMPRESSION: 1. Three highly suspicious masses in the left breast, at 6 o'clock, subareolar, and in the upper-outer left breast at 2 o'clock with associated microcalcifications and nipple inversion.  2. Highly suspicious calcifications in the upper-outer left breast spanning a distance of approximately 7.5 cm.      ASSESSMENT & PLAN:  81 year old female with past medical history of stage I endometrial cancer, status post hysterectomy, anxiety and insomnia, who was recently diagnosed with left breast cancer.  1. Left breast cancer, 3 synchronized lesions, 2 of them with lobular features and one has ductal features. T1b-1cN0M0, stage I, ER+/PR+/HER2- -I reviewed her breast mass biopsy findings and image findings with patient and her daughter extensively. She has 3 synchronized early stage breast cancer without clinical lymph nodes involvement. I discussed that surgical resection, likely mastectomy due to the location of this 3 masses, would be the only curative treatment for her breast cancer. However she is very hesitated to have surgery up front due to the hospitalization and potential complications. -Giving her lobular features, strong ER/PR positivity, I recommend endocrine therapy, i.e. aromatase inhibitor, in the setting of neoadjuvant or adjuvant. Giving her unwillingness to have upfront surgery, I recommend her to start aromatase inhibitor, such as anastrozole as soon as possible. The potential side effects of hot flash, vaginal dryness,  musculoskeletal pain and stiffness, osteoporosis, were explained to patient. She voiced good understanding and agreed to proceed.  -I will repeat a mammogram and ultrasound after 3 Months of hormonal therapy, to evaluate her response to treatment. I encouraged her to reconsider surgery after neoadjuvant therapy. I discussed that hormonal therapy alone is not curative for her breast cancer. -We'll continue anastrozole, she is tolerating it well.  2. Genetics -She has personal history of endometrial cancer, family history of breast cancer and pancreatic cancer. I discussed the genetic screening for inherited breast cancer and lynch syndrome, she declined at this point.  She will continue to follow-up with her primary care physician for other medical issues.  Plan #1 continue Arimidex, change to 1h after meal  -RTC in 2 month with a repeated mammogram and [place] of left breast   All questions were answered. The patient knows to call the clinic with any problems, questions or concerns. I spent 15 minutes counseling the patient face to face. The total time spent in the appointment was 20 minutes and more than 50% was on counseling.     Truitt Merle, MD 03/09/2014 10:48 AM     [place]  Telephone:([phone] Fax:(336) [phone]  Clinic New Consult Note   Patient Care Team: Biagio Borg, MD as PCP - General (Internal Medicine) Eulis Manly. Gershon Crane, MD as Attending Physician (Ophthalmology) Autumn Messing III, MD as Consulting Physician (General Surgery) Truitt Merle, MD as Consulting Physician (Hematology) 03/09/2014  CHIEF COMPLAINTS/PURPOSE OF CONSULTATION:  Newly diagnosed left breast cancer  Oncology History   Breast cancer, left breast   Staging form: Breast, AJCC 7th Edition     Clinical: T1c, N0 - Unsigned Endometrial cancer   Staging form: Corpus Uteri - Adenosarcoma, AJCC 7th Edition     Clinical: T1c, N0 - Unsigned       Endometrial cancer   08/12/2005 Initial Diagnosis  Endometrial cancer, T1bN0, s/p hysrectomy     Breast cancer, left breast   01/13/2014 Imaging mammogram and US showed 3 masses at 6 o'clock, retroareolar and 2 o'clock  area, measuring 0.7-1.7cm.    01/26/2014 Initial Diagnosis Breast cancer, left breast, multifocal (3 lesions, biopsy showed 2 similar lesions with lobular features, and the third lesion has ductal features).      HISTORY OF PRESENTING ILLNESS:  Doris Zamora 81 y.o. female is here because of left breast cancer.  She noticed left breast mass and nipple inversion about months ago. No tenderness or nipple discharge. She otherwise feels well. No change of appetite and weight. ROS (+) indigestion but otherwise negative.  She underwent mammogram and [place] which showed 3 masses at the 6:00, retroareola, and 2:00. The masses measures 1-1.7 cm by mammogram, 0.7-0.9 cm by ultrasound. She underwent subsequent biopsy of this 3 lesions which showed invasive mammary carcinoma, 2 of them showed similar morphology and lobular features, one showed ductal features. Both are ER and PR positive.  She tolerated the biopsy well without complications. She feels well in general, denies any pain or new symptoms. No change in her appetite and weight recently. She was seen by breast surgeon Dr. Marlou Starks yesterday, who recommended mastectomy.  MEDICAL HISTORY:  Past Medical History  Diagnosis Date  . ANXIETY   . HYPERLIPIDEMIA   . INSOMNIA-SLEEP DISORDER-UNSPEC   . WEIGHT LOSS   . Endometrial cancer 2007    SURGICAL HISTORY: Past Surgical History  Procedure Laterality Date  . Abdominal hysterectomy  2007  . Oophorectomy  2007  . Thyroidectomy, partial  1983   GYN HISTORY  Menarchal: 14 LMP: age of 89 Contraceptive: 1 yr  HRT: no  G3P3:   SOCIAL HISTORY: History   Social History  . Marital Status: Widowed    Spouse Name: N/A    Number of Children: 3  . Years of Education: N/A   Occupational History  . Not on file.   Social History Main  Topics  . Smoking status: Former Smoker -- 50 years    Types: Cigarettes    Quit date: 02/10/1994  . Smokeless tobacco: Never Used  . Alcohol Use: 4.2 oz/week    7 Glasses of wine per week     Comment: 2 0z. HS to help her sleep  . Drug Use: No     Comment: admiited to poat use of marjuana to help her sleep  . Sexual Activity: Not on file   Other Topics Concern  . 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 Diabetes Mother   . Muscular dystrophy Father   . Cancer Sister 32    pancreatic cancer   . Cancer Other 40    breast cancer     ALLERGIES:  is allergic to synthroid and pneumovax.  MEDICATIONS:  Current Outpatient Prescriptions  Medication Sig Dispense Refill  . acetaminophen (TYLENOL EX ST ARTHRITIS PAIN) 500 MG tablet Take 500 mg by mouth every 6 (six) hours as needed.    Marland Kitchen acetaminophen (TYLENOL) 650 MG CR tablet Take 1,300 mg by mouth every 8 (eight) hours as needed for pain.    . ALPHA-LIPOIC ACID 100 MG TABS Take by mouth daily.    Marland Kitchen ALPRAZolam (XANAX) 0.5 MG tablet Take 1 tablet (0.5 mg total) by mouth 3 (three) times daily as needed. 90 tablet 5        . CALCIUM-MAGNESUIUM-ZINC 333-133-8.3 MG TABS Take by mouth daily.    . cholecalciferol (VITAMIN  D) 1000 UNITS tablet Take 1,000 Units by mouth daily.    Marland Kitchen co-enzyme Q-10 30 MG capsule Take 100 mg by mouth daily.     . Multiple Vitamins-Minerals (MULTIVITAMIN WITH MINERALS) tablet Take 1 tablet by mouth daily. Forward Gold    . Omega-3 Fatty Acids (FISH OIL) 1000 MG CPDR Take by mouth daily.    Marland Kitchen tiZANidine (ZANAFLEX) 4 MG tablet Take 1 tablet (4 mg total) by mouth every 6 (six) hours as needed for muscle spasms. 40 tablet 1  . zolpidem (AMBIEN) 5 MG tablet take 1 tablet by mouth at bedtime if needed for sleep 30 tablet 5   No current facility-administered medications for this visit.     REVIEW OF SYSTEMS:   Constitutional: Denies fevers, chills or abnormal night sweats Eyes: Denies blurriness of vision, double vision or watery eyes Ears, nose, mouth, throat, and face: Denies mucositis or sore throat Respiratory: Denies cough, dyspnea or wheezes Cardiovascular: Denies palpitation, chest discomfort or lower extremity swelling Gastrointestinal:  Denies nausea, heartburn or change in bowel habits Skin: Denies abnormal skin rashes Lymphatics: Denies new lymphadenopathy or easy bruising Neurological:Denies numbness, tingling or new weaknesses Behavioral/Psych: Mood is stable, no new changes  All other systems were reviewed with the patient and are negative.  PHYSICAL EXAMINATION: ECOG PERFORMANCE STATUS: 0 - Asymptomatic  Filed Vitals:   03/09/14 0957  BP: 152/58  Pulse: 66  Temp: 98.1 F (36.7 C)  Resp: 18   Filed Weights   03/09/14 0957  Weight: 184 lb 14.4 oz (83.87 kg)    GENERAL:alert, no distress and comfortable SKIN: skin color, texture, turgor are normal, no rashes or significant lesions EYES: normal, conjunctiva are pink and non-injected, sclera clear OROPHARYNX:no exudate, no erythema and lips, buccal mucosa, and tongue normal  NECK: supple, thyroid normal size, non-tender, without nodularity LYMPH:  no palpable lymphadenopathy in the cervical, axillary or inguinal LUNGS: clear to auscultation and percussion with normal breathing effort HEART: regular rate & rhythm and no murmurs and no lower extremity edema ABDOMEN:abdomen soft, non-tender and normal bowel sounds Musculoskeletal:no cyanosis of digits and no clubbing  PSYCH: alert & oriented x 3 with fluent speech NEURO: no focal motor/sensory deficits Breasts: Breast inspection showed them to be symmetrical, (+) left nipple inversion. There is a 2 x 3.5 cm mass inferior to her left breast nipple, no other palpable mass in the left breast or right breast. Palpation of the bilateral  axilla revealed  no obvious mass that I could appreciate.  LABORATORY DATA:  I have reviewed the data as listed Lab Results  Component Value Date   WBC 6.4 05/11/2013   HGB 13.4 05/11/2013   HCT 40.3 05/11/2013   MCV 93.4 05/11/2013   PLT 235.0 05/11/2013    Recent Labs  05/11/13 1017 05/12/13 1206 03/09/14 0920  NA 137  --  141  K 4.4  --  4.2  CL 101  --   --   CO2 30  --  27  GLUCOSE 76  --  151*  BUN 14  --  14.0  CREATININE 0.7  --  0.8  CALCIUM 9.3  --  9.0  PROT  --  8.2 7.2  ALBUMIN  --  3.8 3.3*  AST  --  39* 29  ALT  --  38* 23  ALKPHOS  --  70 66  BILITOT  --  0.6 0.54  BILIDIR  --  0.1  --    PATHOLOGY REPORT Diagnosis 1. Breast,  left, needle core biopsy, mass, lower inner, 6:30 o'clock - INVASIVE MAMMARY CARCINOMA. - SEE COMMENT. 2. Breast, left, needle core biopsy, mass, retroareolar - INVASIVE MAMMARY CARCINOMA. - MAMMARY CARCINOMA IN SITU. - SEE COMMENT. 3. Breast, left, needle core biopsy, mass, 2:30 o'clock - INVASIVE MAMMARY CARCINOMA. - MAMMARY CARCINOMA IN SITU WITH CALCIFICATIONS. - SEE COMMENT. Microscopic Comment 1. The carcinoma is grade I and has some lobular features. 2. The carcinoma on part 2 is morphologically identical to that in part 1. 3. The carcinoma appears grade II and is likely a ductal phenotype. Breast prognostic profile will be performed on parts 1 and 3 and the results reported separately. The results were called to [facility] on 01/27/14.  Estrogen Receptor: 100%, POSITIVE, STRONG STAINING INTENSITY Progesterone Receptor: 100%, POSITIVE, STRONG STAINING INTENSITY Proliferation Marker Ki67: 29%  Results: HER-2/NEU BY CISH - NO AMPLIFICATION OF HER-2 DETECTED. RESULT RATIO OF HER2: CEP 17 SIGNALS 0.94 AVERAGE HER2 COPY NUMBER PER CELL 1.65   RADIOGRAPHIC STUDIES: I have personally reviewed the radiological images as listed and agreed with the findings in the report.  Mm Digital Diagnostic Bilat 01/13/2014     FINDINGS: No suspicious masses or calcifications are seen in the right breast.  The left nipple is inverted. Spot compression tangential view over the palpable site of concern in the retroareolar left breast was performed demonstrating an irregular mass with associated microcalcifications measuring approximately 1.7 cm as well as an additional irregular mass in the retroareolar left breast with associated microcalcifications measuring approximately 1 cm.  There is an irregular mass in the upper-outer left breast Additional suspicious pleomorphic calcifications are seen throughout the upper-outer left breast spanning a distance of approximately 7.5 mm, confirmed on the additional spot compression magnification views.  Mammographic images were processed with CAD.  Physical examination of the subareolar/slightly inferior left breast reveals a palpable firm mass with associated inversion of the left nipple.  Targeted ultrasound of the left breast was performed demonstrating an irregular hypoechoic mass/area of distortion at 6 o'clock 3 cm from nipple measuring 0.8 x 0.9 x 0.9 cm. An additional mass in the subareolar left breast measures 0.7 x 0.5 x 0.5 cm. A suspicious mass in the upper-outer left breast at 2 o'clock 4 cm from the nipple measures 0.8 x 0.9 x 0.6 cm. These masses correspond with mammography findings.  No definite lymphadenopathy seen in the left axilla.   IMPRESSION: 1. Three highly suspicious masses in the left breast, at 6 o'clock, subareolar, and in the upper-outer left breast at 2 o'clock with associated microcalcifications and nipple inversion.  2. Highly suspicious calcifications in the upper-outer left breast spanning a distance of approximately 7.5 cm.      ASSESSMENT & PLAN:  81 year old female with past medical history of stage I endometrial cancer, status post hysterectomy, anxiety and insomnia, who was recently diagnosed with left breast cancer.  1. Left breast cancer, 3  synchronized lesions, 2 of them with lobular features and one has ductal features. T1b-1cN0M0, stage I, ER+/PR+/HER2- -I reviewed her breast mass biopsy findings and image findings with patient and her daughter extensively. She has 3 synchronized early stage breast cancer without clinical lymph nodes involvement. I discussed that surgical resection, likely mastectomy due to the location of this 3 masses, would be the only curative treatment for her breast cancer. However she is very hesitated to have surgery up front due to the hospitalization and potential complications. -Giving her lobular features, strong ER/PR positivity, I  recommend endocrine therapy, i.e. aromatase inhibitor, in the setting of neoadjuvant or adjuvant. Giving her unwillingness to have upfront surgery, I recommend her to start aromatase inhibitor, such as anastrozole as soon as possible. The potential side effects of hot flash, vaginal dryness, musculoskeletal pain and stiffness, osteoporosis, were explained to patient. She voiced good understanding and agreed to proceed. A prescription was sent to her pharmacy. -I we'll repeat a mammogram and ultrasound after 3-6 months of hormonal therapy, to evaluate her response to treatment. I encouraged her to reconsider surgery after neoadjuvant therapy. I discussed that hormonal therapy alone is not curative for her breast cancer. -After the long discussion, she agrees to the above, and we'll start anastrozole when she filled her prescription.  2. Genetics -She has personal history of endometrial cancer, family history of breast cancer and pancreatic cancer. I discussed the genetic screening for inherited breast cancer and lynch syndrome, she declined at this point.  She will continue to follow-up with her primary care physician for other medical issues.  Plan #1 she was started anastrozole in a neoadjuvant setting #2 inverted her response in 3-6 months #3 return to my clinic in 1 month's  for follow-up.    No orders of the defined types were placed in this encounter.    All questions were answered. The patient knows to call the clinic with any problems, questions or concerns. I spent 40 minutes counseling the patient face to face. The total time spent in the appointment was 55 minutes and more than 50% was on counseling.     Truitt Merle, MD 03/09/2014 10:48 AM

## 2014-03-10 ENCOUNTER — Telehealth: Payer: Self-pay | Admitting: Hematology

## 2014-03-10 NOTE — Telephone Encounter (Signed)
Left message to confirm Mammo.

## 2014-03-11 ENCOUNTER — Encounter: Payer: Self-pay | Admitting: Hematology

## 2014-04-16 ENCOUNTER — Emergency Department (HOSPITAL_COMMUNITY)
Admission: EM | Admit: 2014-04-16 | Discharge: 2014-04-16 | Disposition: A | Discharge status: Home / Self Care | Payer: Medicare Other | Attending: Emergency Medicine | Admitting: Emergency Medicine

## 2014-04-16 ENCOUNTER — Encounter (HOSPITAL_COMMUNITY): Payer: Self-pay | Admitting: Emergency Medicine

## 2014-04-16 DIAGNOSIS — F419 Anxiety disorder, unspecified: Secondary | ICD-10-CM | POA: Insufficient documentation

## 2014-04-16 DIAGNOSIS — Z79818 Long term (current) use of other agents affecting estrogen receptors and estrogen levels: Secondary | ICD-10-CM | POA: Diagnosis not present

## 2014-04-16 DIAGNOSIS — Z8542 Personal history of malignant neoplasm of other parts of uterus: Secondary | ICD-10-CM | POA: Diagnosis not present

## 2014-04-16 DIAGNOSIS — M25562 Pain in left knee: Secondary | ICD-10-CM | POA: Insufficient documentation

## 2014-04-16 DIAGNOSIS — Z87891 Personal history of nicotine dependence: Secondary | ICD-10-CM | POA: Diagnosis not present

## 2014-04-16 DIAGNOSIS — Z8639 Personal history of other endocrine, nutritional and metabolic disease: Secondary | ICD-10-CM | POA: Diagnosis not present

## 2014-04-16 DIAGNOSIS — G4700 Insomnia, unspecified: Secondary | ICD-10-CM | POA: Diagnosis not present

## 2014-04-16 DIAGNOSIS — Z79899 Other long term (current) drug therapy: Secondary | ICD-10-CM | POA: Diagnosis not present

## 2014-04-16 MED ORDER — METHYLPREDNISOLONE ACETATE 40 MG/ML IJ SUSP
40.0000 mg | Freq: Once | INTRAMUSCULAR | Status: DC
Start: 2014-04-16 — End: 2014-04-16

## 2014-04-16 MED ORDER — ACETAMINOPHEN-CODEINE #3 300-30 MG PO TABS: 1.0000 | ORAL_TABLET | Freq: Four times a day (QID) | ORAL | Status: DC | PRN

## 2014-04-16 MED ORDER — BUPIVACAINE HCL (PF) 0.5 % IJ SOLN: 5.0000 mL | Freq: Once | INTRAMUSCULAR | Status: DC

## 2014-04-16 NOTE — ED Provider Notes (Signed)
CSN: [id number]     Arrival date & time 04/16/14  1128 History  This chart was scribed for non-physician practitioner, Domenic Moras, PA-C, working with Debby Freiberg, MD, by Stephania Fragmin, ED Scribe. This patient was seen in room TR07C/TR07C and the patient's care was started at 12:11 AM.    Chief Complaint  Patient presents with  . Knee Pain   The history is provided by the patient. No language interpreter was used.     HPI Comments: Doris Zamora is a 81 y.o. female who presents to the Emergency Department complaining of constant, 8/10 left knee pain that has been ongoing for a month. Walking exacerbates the pain. Patient had an injection for the same problem in the past, which alleviated the pain. She last had an injection a couple months ago. Patient has tried some OTC medication with minimal relief. She has NKDA.  No fever, chills or rash.  No hip or ankle pain.  No injury  Past Medical History  Diagnosis Date  . ANXIETY   . HYPERLIPIDEMIA   . INSOMNIA-SLEEP DISORDER-UNSPEC   . WEIGHT LOSS   . Endometrial cancer 2007   Past Surgical History  Procedure Laterality Date  . Abdominal hysterectomy  2007  . Oophorectomy  2007  . Thyroidectomy, partial  1983   Family History  Problem Relation Age of Onset  . Diabetes Mother   . Muscular dystrophy Father   . Cancer Sister 46    pancreatic cancer   . Cancer Other 40    breast cancer    History  Substance Use Topics  . Smoking status: Former Smoker -- 50 years    Types: Cigarettes    Quit date: 02/10/1994  . Smokeless tobacco: Never Used  . Alcohol Use: 4.2 oz/week    7 Glasses of wine per week     Comment: 2 0z. HS to help her sleep   OB History    No data available     Review of Systems  Constitutional: Negative for fever.  Musculoskeletal: Positive for arthralgias.      Allergies  Synthroid and Pneumovax  Home Medications   Prior to Admission medications   Medication Sig Start Date End Date Taking? Authorizing  Provider  ALPHA-LIPOIC ACID 100 MG TABS Take by mouth daily.    Historical Provider, MD  ALPRAZolam Duanne Moron) 0.5 MG tablet Take 1 tablet (0.5 mg total) by mouth 3 (three) times daily as needed. 11/09/13   Biagio Borg, MD  anastrozole (ARIMIDEX) 1 MG tablet Take 1 tablet (1 mg total) by mouth daily. 02/08/14   Truitt Merle, MD  CALCIUM-MAGNESUIUM-ZINC [phone] MG TABS Take by mouth daily.    Historical Provider, MD  cholecalciferol (VITAMIN D) 1000 UNITS tablet Take 1,000 Units by mouth daily.    Historical Provider, MD  co-enzyme Q-10 30 MG capsule Take 100 mg by mouth daily.     Historical Provider, MD  diphenhydramine-acetaminophen (TYLENOL PM) 25-500 MG TABS Take 1 tablet by mouth at bedtime as needed.    Historical Provider, MD  Multiple Vitamins-Minerals (MULTIVITAMIN WITH MINERALS) tablet Take 1 tablet by mouth daily. Forward [employer]    Historical Provider, MD  Omega-3 Fatty Acids (FISH OIL) 1000 MG CPDR Take by mouth daily.    Historical Provider, MD  tiZANidine (ZANAFLEX) 4 MG tablet Take 1 tablet (4 mg total) by mouth every 6 (six) hours as needed for muscle spasms. 09/21/13   Biagio Borg, MD  zolpidem (AMBIEN) 5 MG tablet take  1 tablet by mouth at bedtime if needed for sleep Patient not taking: Reported on 03/09/2014 05/10/13   Biagio Borg, MD   BP 130/62 mmHg  Pulse 62  Temp(Src) 97.7 F (36.5 C) (Oral)  SpO2 98% Physical Exam  Constitutional: She is oriented to person, place, and time. She appears well-developed and well-nourished. No distress.  HENT:  Head: Normocephalic and atraumatic.  Eyes: Conjunctivae and EOM are normal.  Neck: Neck supple. No tracheal deviation present.  Cardiovascular: Normal rate.   Pulmonary/Chest: Effort normal. No respiratory distress.  Musculoskeletal: Normal range of motion. She exhibits edema and tenderness.  Left knee: Tenderness noted to the medial joint line on palpation with very mild swelling. No joint laxity. Knee with full flexion and  extension. Normal left ankle and hip. Pedal pulses palpable. 5/5 strength.  Neurological: She is alert and oriented to person, place, and time.  Skin: Skin is warm and dry.  Psychiatric: She has a normal mood and affect. Her behavior is normal.  Nursing note and vitals reviewed.   ED Course  Procedures (including critical care time)  DIAGNOSTIC STUDIES: Oxygen Saturation is 98% on room air, normal by my interpretation.    COORDINATION OF CARE: 12:14 PM - Discussed treatment plan with pt at bedside which includes medications and knee sleeve, and pt agreed to plan. Do not see any signs of infection at this time.  Pt able to ambulate.  She may benefit from depo-medrol/marcain injection from her PCP as she has benefit from injection in the past.     MDM   Final diagnoses:  Left knee pain   BP 130/62 mmHg  Pulse 62  Temp(Src) 97.7 F (36.5 C) (Oral)  SpO2 98%  I personally performed the services described in this documentation, which was scribed in my presence. The recorded information has been reviewed and is accurate.      Domenic Moras, PA-C 04/16/14 [place], PA-C 04/16/14 1610  Debby Freiberg, MD 04/19/14 [phone]

## 2014-04-16 NOTE — ED Notes (Signed)
Declined W/C at D/C and was escorted to lobby by RN. 

## 2014-04-16 NOTE — ED Notes (Signed)
Pt sts left knee pain that is chronic in nature worse over last several weeks; pt denies new injury

## 2014-04-16 NOTE — Discharge Instructions (Signed)
Please wear knee sleeve for support.  Take tylenol #3 as needed for pain but be aware of risk of drowsiness with medication.  Follow up with your doctor for further management of your knee pain.  Knee Pain The knee is the complex joint between your thigh and your lower leg. It is made up of bones, tendons, ligaments, and cartilage. The bones that make up the knee are:  The femur in the thigh.  The tibia and fibula in the lower leg.  The patella or kneecap riding in the groove on the lower femur. CAUSES  Knee pain is a common complaint with many causes. A few of these causes are:  Injury, such as:  A ruptured ligament or tendon injury.  Torn cartilage.  Medical conditions, such as:  Gout  Arthritis  Infections  Overuse, over training, or overdoing a physical activity. Knee pain can be minor or severe. Knee pain can accompany debilitating injury. Minor knee problems often respond well to self-care measures or get well on their own. More serious injuries may need medical intervention or even surgery. SYMPTOMS The knee is complex. Symptoms of knee problems can vary widely. Some of the problems are:  Pain with movement and weight bearing.  Swelling and tenderness.  Buckling of the knee.  Inability to straighten or extend your knee.  Your knee locks and you cannot straighten it.  Warmth and redness with pain and fever.  Deformity or dislocation of the kneecap. DIAGNOSIS  Determining what is wrong may be very straight forward such as when there is an injury. It can also be challenging because of the complexity of the knee. Tests to make a diagnosis may include:  Your caregiver taking a history and doing a physical exam.  Routine X-rays can be used to rule out other problems. X-rays will not reveal a cartilage tear. Some injuries of the knee can be diagnosed by:  Arthroscopy a surgical technique by which a small video camera is inserted through tiny incisions on the sides  of the knee. This procedure is used to examine and repair internal knee joint problems. Tiny instruments can be used during arthroscopy to repair the torn knee cartilage (meniscus).  Arthrography is a radiology technique. A contrast liquid is directly injected into the knee joint. Internal structures of the knee joint then become visible on X-ray film.  An MRI scan is a non X-ray radiology procedure in which magnetic fields and a computer produce two- or three-dimensional images of the inside of the knee. Cartilage tears are often visible using an MRI scanner. MRI scans have largely replaced arthrography in diagnosing cartilage tears of the knee.  Blood work.  Examination of the fluid that helps to lubricate the knee joint (synovial fluid). This is done by taking a sample out using a needle and a syringe. TREATMENT The treatment of knee problems depends on the cause. Some of these treatments are:  Depending on the injury, proper casting, splinting, surgery, or physical therapy care will be needed.  Give yourself adequate recovery time. Do not overuse your joints. If you begin to get sore during workout routines, back off. Slow down or do fewer repetitions.  For repetitive activities such as cycling or running, maintain your strength and nutrition.  Alternate muscle groups. For example, if you are a weight lifter, work the upper body on one day and the lower body the next.  Either tight or weak muscles do not give the proper support for your knee. Tight or  weak muscles do not absorb the stress placed on the knee joint. Keep the muscles surrounding the knee strong.  Take care of mechanical problems.  If you have flat feet, orthotics or special shoes may help. See your caregiver if you need help.  Arch supports, sometimes with wedges on the inner or outer aspect of the heel, can help. These can shift pressure away from the side of the knee most bothered by osteoarthritis.  A brace called an  "unloader" brace also may be used to help ease the pressure on the most arthritic side of the knee.  If your caregiver has prescribed crutches, braces, wraps or ice, use as directed. The acronym for this is PRICE. This means protection, rest, ice, compression, and elevation.  Nonsteroidal anti-inflammatory drugs (NSAIDs), can help relieve pain. But if taken immediately after an injury, they may actually increase swelling. Take NSAIDs with food in your stomach. Stop them if you develop stomach problems. Do not take these if you have a history of ulcers, stomach pain, or bleeding from the bowel. Do not take without your caregiver's approval if you have problems with fluid retention, heart failure, or kidney problems.  For ongoing knee problems, physical therapy may be helpful.  Glucosamine and chondroitin are over-the-counter dietary supplements. Both may help relieve the pain of osteoarthritis in the knee. These medicines are different from the usual anti-inflammatory drugs. Glucosamine may decrease the rate of cartilage destruction.  Injections of a corticosteroid drug into your knee joint may help reduce the symptoms of an arthritis flare-up. They may provide pain relief that lasts a few months. You may have to wait a few months between injections. The injections do have a small increased risk of infection, water retention, and elevated blood sugar levels.  Hyaluronic acid injected into damaged joints may ease pain and provide lubrication. These injections may work by reducing inflammation. A series of shots may give relief for as long as 6 months.  Topical painkillers. Applying certain ointments to your skin may help relieve the pain and stiffness of osteoarthritis. Ask your pharmacist for suggestions. Many over the-counter products are approved for temporary relief of arthritis pain.  In some countries, doctors often prescribe topical NSAIDs for relief of chronic conditions such as arthritis and  tendinitis. A review of treatment with NSAID creams found that they worked as well as oral medications but without the serious side effects. PREVENTION  Maintain a healthy weight. Extra pounds put more strain on your joints.  Get strong, stay limber. Weak muscles are a common cause of knee injuries. Stretching is important. Include flexibility exercises in your workouts.  Be smart about exercise. If you have osteoarthritis, chronic knee pain or recurring injuries, you may need to change the way you exercise. This does not mean you have to stop being active. If your knees ache after jogging or playing basketball, consider switching to swimming, water aerobics, or other low-impact activities, at least for a few days a week. Sometimes limiting high-impact activities will provide relief.  Make sure your shoes fit well. Choose footwear that is right for your sport.  Protect your knees. Use the proper gear for knee-sensitive activities. Use kneepads when playing volleyball or laying carpet. Buckle your seat belt every time you drive. Most shattered kneecaps occur in car accidents.  Rest when you are tired. SEEK MEDICAL CARE IF:  You have knee pain that is continual and does not seem to be getting better.  SEEK IMMEDIATE MEDICAL CARE IF:  Your knee joint feels hot to the touch and you have a high fever. MAKE SURE YOU:   Understand these instructions.  Will watch your condition.  Will get help right away if you are not doing well or get worse. Document Released: 11/24/2006 Document Revised: 04/21/2011 Document Reviewed: 11/24/2006 [facility] Patient Information 2015 [place], [place]. This information is not intended to replace advice given to you by your health care provider. Make sure you discuss any questions you have with your health care provider.

## 2014-04-17 ENCOUNTER — Emergency Department (INDEPENDENT_AMBULATORY_CARE_PROVIDER_SITE_OTHER)
Admission: EM | Admit: 2014-04-17 | Discharge: 2014-04-17 | Disposition: A | Discharge status: Home / Self Care | Payer: Medicare Other | Source: Home / Self Care | Attending: Emergency Medicine | Admitting: Emergency Medicine

## 2014-04-17 ENCOUNTER — Encounter (HOSPITAL_COMMUNITY): Payer: Self-pay | Admitting: Emergency Medicine

## 2014-04-17 DIAGNOSIS — M1712 Unilateral primary osteoarthritis, left knee: Secondary | ICD-10-CM

## 2014-04-17 MED ORDER — METHYLPREDNISOLONE ACETATE 40 MG/ML IJ SUSP
INTRAMUSCULAR | Status: AC
  Filled 2014-04-17: qty 1

## 2014-04-17 MED ORDER — BUPIVACAINE HCL (PF) 0.5 % IJ SOLN
INTRAMUSCULAR | Status: AC
Start: 2014-04-17 — End: 2014-04-17
  Filled 2014-04-17: qty 10

## 2014-04-17 NOTE — Discharge Instructions (Signed)
Knee Injection Joint injections are shots. Your caregiver will place a needle into your knee joint. The needle is used to put medicine into the joint. These shots can be used to help treat different painful knee conditions such as osteoarthritis, bursitis, local flare-ups of rheumatoid arthritis, and pseudogout. Anti-inflammatory medicines such as corticosteroids and anesthetics are the most common medicines used for joint and soft tissue injections.  PROCEDURE  The skin over the kneecap will be cleaned with an antiseptic solution.  Your caregiver will inject a small amount of a local anesthetic (a medicine like Novocaine) just under the skin in the area that was cleaned.  After the area becomes numb, a second injection is done. This second injection usually includes an anesthetic and an anti-inflammatory medicine called a steroid or cortisone. The needle is carefully placed in between the kneecap and the knee, and the medicine is injected into the joint space.  After the injection is done, the needle is removed. Your caregiver may place a bandage over the injection site. The whole procedure takes no more than a couple of minutes. BEFORE THE PROCEDURE  Wash all of the skin around the entire knee area. Try to remove any loose, scaling skin. There is no other specific preparation necessary unless advised otherwise by your caregiver. LET YOUR CAREGIVER KNOW ABOUT:   Allergies.  Medications taken including herbs, eye drops, over the counter medications, and creams.  Use of steroids (by mouth or creams).  Possible pregnancy, if applicable.  Previous problems with anesthetics or Novocaine.  History of blood clots (thrombophlebitis).  History of bleeding or blood problems.  Previous surgery.  Other health problems. RISKS AND COMPLICATIONS Side effects from cortisone shots are rare. They include:   Slight bruising of the skin.  Shrinkage of the normal fatty tissue under the skin where  the shot was given.  Increase in pain after the shot.  Infection.  Weakening of tendons or tendon rupture.  Allergic reaction to the medicine.  Diabetics may have a temporary increase in their blood sugar after a shot.  Cortisone can temporarily weaken the immune system. While receiving these shots, you should not get certain vaccines. Also, avoid contact with anyone who has chickenpox or measles. Especially if you have never had these diseases or have not been previously immunized. Your immune system may not be strong enough to fight off the infection while the cortisone is in your system. AFTER THE PROCEDURE   You can go home after the procedure.  You may need to put ice on the joint 15-20 minutes every 3 or 4 hours until the pain goes away.  You may need to put an elastic bandage on the joint. HOME CARE INSTRUCTIONS   Only take over-the-counter or prescription medicines for pain, discomfort, or fever as directed by your caregiver.  You should avoid stressing the joint. Unless advised otherwise, avoid activities that put a lot of pressure on a knee joint, such as:  Jogging.  Bicycling.  Recreational climbing.  Hiking.  Laying down and elevating the leg/knee above the level of your heart can help to minimize swelling. SEEK MEDICAL CARE IF:   You have repeated or worsening swelling.  There is drainage from the puncture area.  You develop red streaking that extends above or below the site where the needle was inserted. SEEK IMMEDIATE MEDICAL CARE IF:   You develop a fever.  You have pain that gets worse even though you are taking pain medicine.  The area is  red and warm, and you have trouble moving the joint. MAKE SURE YOU:   Understand these instructions.  Will watch your condition.  Will get help right away if you are not doing well or get worse. Document Released: 04/20/2006 Document Revised: 04/21/2011 Document Reviewed: 01/15/2007 [facility] Patient  Information 2015 [place], [place]. This information is not intended to replace advice given to you by your health care provider. Make sure you discuss any questions you have with your health care provider.  Osteoarthritis Osteoarthritis is a disease that causes soreness and inflammation of a joint. It occurs when the cartilage at the affected joint wears down. Cartilage acts as a cushion, covering the ends of bones where they meet to form a joint. Osteoarthritis is the most common form of arthritis. It often occurs in older people. The joints affected most often by this condition include those in the:  Ends of the fingers.  Thumbs.  Neck.  Lower back.  Knees.  Hips. CAUSES  Over time, the cartilage that covers the ends of bones begins to wear away. This causes bone to rub on bone, producing pain and stiffness in the affected joints.  RISK FACTORS Certain factors can increase your chances of having osteoarthritis, including:  Older age.  Excessive body weight.  Overuse of joints.  Previous joint injury. SIGNS AND SYMPTOMS   Pain, swelling, and stiffness in the joint.  Over time, the joint may lose its normal shape.  Small deposits of bone (osteophytes) may grow on the edges of the joint.  Bits of bone or cartilage can break off and float inside the joint space. This may cause more pain and damage. DIAGNOSIS  Your health care provider will do a physical exam and ask about your symptoms. Various tests may be ordered, such as:  X-rays of the affected joint.  An MRI scan.  Blood tests to rule out other types of arthritis.  Joint fluid tests. This involves using a needle to draw fluid from the joint and examining the fluid under a microscope. TREATMENT  Goals of treatment are to control pain and improve joint function. Treatment plans may include:  A prescribed exercise program that allows for rest and joint relief.  A weight control plan.  Pain relief techniques, such  as:  Properly applied heat and cold.  Electric pulses delivered to nerve endings under the skin (transcutaneous electrical nerve stimulation [TENS]).  Massage.  Certain nutritional supplements.  Medicines to control pain, such as:  Acetaminophen.  Nonsteroidal anti-inflammatory drugs (NSAIDs), such as naproxen.  Narcotic or central-acting agents, such as tramadol.  Corticosteroids. These can be given orally or as an injection.  Surgery to reposition the bones and relieve pain (osteotomy) or to remove loose pieces of bone and cartilage. Joint replacement may be needed in advanced states of osteoarthritis. HOME CARE INSTRUCTIONS   Take medicines only as directed by your health care provider.  Maintain a healthy weight. Follow your health care provider's instructions for weight control. This may include dietary instructions.  Exercise as directed. Your health care provider can recommend specific types of exercise. These may include:  Strengthening exercises. These are done to strengthen the muscles that support joints affected by arthritis. They can be performed with weights or with exercise bands to add resistance.  Aerobic activities. These are exercises, such as brisk walking or low-impact aerobics, that get your heart pumping.  Range-of-motion activities. These keep your joints limber.  Balance and agility exercises. These help you maintain daily living skills.  Rest your affected joints as directed by your health care provider.  Keep all follow-up visits as directed by your health care provider. SEEK MEDICAL CARE IF:   Your skin turns red.  You develop a rash in addition to your joint pain.  You have worsening joint pain.  You have a fever along with joint or muscle aches. SEEK IMMEDIATE MEDICAL CARE IF:  You have a significant loss of weight or appetite.  You have night sweats. [place] of Arthritis and Musculoskeletal and  Skin Diseases: www.niams.SouthExposed.es  [employer] on Aging: http://kim-miller.com/  American College of Rheumatology: www.rheumatology.org Document Released: 01/27/2005 Document Revised: 06/13/2013 Document Reviewed: 10/04/2012 [facility] Patient Information 2015 [place], [place]. This information is not intended to replace advice given to you by your health care provider. Make sure you discuss any questions you have with your health care provider.

## 2014-04-17 NOTE — ED Notes (Signed)
Knee pain for 3 days.  Patient has used heating pad and tylenol #3.

## 2014-04-17 NOTE — ED Provider Notes (Signed)
CSN: [id number]     Arrival date & time 04/17/14  1052 History   First MD Initiated Contact with Patient 04/17/14 1242     No chief complaint on file.  (Consider location/radiation/quality/duration/timing/severity/associated sxs/prior Treatment) HPI Comments: 81 year old female with chronic left knee degenerative joint disease presents with acute on chronic left knee pain. She was seen in the emergency department yesterday for the same pain. She was given a knee sleeve to wear and asked to follow-up with her PCP. She comes in today with the same pain in the left knee area it is worse with weight bearing. She is had at least 2 if not 3 injections in the left knee sets May 2015.   Past Medical History  Diagnosis Date  . ANXIETY   . HYPERLIPIDEMIA   . INSOMNIA-SLEEP DISORDER-UNSPEC   . WEIGHT LOSS   . Endometrial cancer 2007   Past Surgical History  Procedure Laterality Date  . Abdominal hysterectomy  2007  . Oophorectomy  2007  . Thyroidectomy, partial  1983   Family History  Problem Relation Age of Onset  . Diabetes Mother   . Muscular dystrophy Father   . Cancer Sister 50    pancreatic cancer   . Cancer Other 40    breast cancer    History  Substance Use Topics  . Smoking status: Former Smoker -- 50 years    Types: Cigarettes    Quit date: 02/10/1994  . Smokeless tobacco: Never Used  . Alcohol Use: 4.2 oz/week    7 Glasses of wine per week     Comment: 2 0z. HS to help her sleep   OB History    No data available     Review of Systems  Constitutional: Negative.   Musculoskeletal: Positive for joint swelling and arthralgias. Negative for back pain.  Skin: Negative.     Allergies  Synthroid and Pneumovax  Home Medications   Prior to Admission medications   Medication Sig Start Date End Date Taking? Authorizing Provider  acetaminophen-codeine (TYLENOL #3) 300-30 MG per tablet Take 1-2 tablets by mouth every 6 (six) hours as needed for moderate pain. 04/16/14    Domenic Moras, PA-C  ALPHA-LIPOIC ACID 100 MG TABS Take by mouth daily.    Historical Provider, MD  ALPRAZolam Duanne Moron) 0.5 MG tablet Take 1 tablet (0.5 mg total) by mouth 3 (three) times daily as needed. 11/09/13   Biagio Borg, MD  anastrozole (ARIMIDEX) 1 MG tablet Take 1 tablet (1 mg total) by mouth daily. 02/08/14   Truitt Merle, MD  CALCIUM-MAGNESUIUM-ZINC [phone] MG TABS Take by mouth daily.    Historical Provider, MD  cholecalciferol (VITAMIN D) 1000 UNITS tablet Take 1,000 Units by mouth daily.    Historical Provider, MD  co-enzyme Q-10 30 MG capsule Take 100 mg by mouth daily.     Historical Provider, MD  diphenhydramine-acetaminophen (TYLENOL PM) 25-500 MG TABS Take 1 tablet by mouth at bedtime as needed.    Historical Provider, MD  Multiple Vitamins-Minerals (MULTIVITAMIN WITH MINERALS) tablet Take 1 tablet by mouth daily. Forward Gold    Historical Provider, MD  Omega-3 Fatty Acids (FISH OIL) 1000 MG CPDR Take by mouth daily.    Historical Provider, MD  tiZANidine (ZANAFLEX) 4 MG tablet Take 1 tablet (4 mg total) by mouth every 6 (six) hours as needed for muscle spasms. 09/21/13   Biagio Borg, MD  zolpidem (AMBIEN) 5 MG tablet take 1 tablet by mouth at bedtime if needed for sleep  Patient not taking: Reported on 03/09/2014 05/10/13   Biagio Borg, MD   BP 148/81 mmHg  Pulse 65  Temp(Src) 98.3 F (36.8 C) (Oral)  Resp 12  SpO2 98% Physical Exam  Constitutional: She is oriented to person, place, and time. She appears well-developed and well-nourished. No distress.  Pulmonary/Chest: Effort normal. No respiratory distress.  Musculoskeletal:  The left knee with minimal swelling to the medial aspect. There is no discoloration, no erythema, no deformity, no lymphangitis or signs of infection. Normal warmth and color. Extension to 180 flexion to 100.  Neurological: She is alert and oriented to person, place, and time.  Skin: Skin is warm and dry.  Nursing note and vitals reviewed.   ED  Course  Injection of joint Date/Time: 04/17/2014 1:36 PM Performed by: Marcha Dutton, Arby Dahir Authorized by: Philipp Deputy C Consent: Verbal consent obtained. Risks and benefits: risks, benefits and alternatives were discussed Consent given by: patient Patient understanding: patient states understanding of the procedure being performed Patient identity confirmed: verbally with patient Preparation: Patient was prepped and draped in the usual sterile fashion. Local anesthesia used: yes Anesthesia: local infiltration Local anesthetic: bupivacaine 0.25% without epinephrine Anesthetic total: 4 ml Patient sedated: no Patient tolerance: Patient tolerated the procedure well with no immediate complications Comments: 4 cc of bupivacaine and 1 cc (40 mg) Depo-Medrol injected to the left medial joint space.   (including critical care time) Labs Review Labs Reviewed - No data to display  Imaging Review No results found.   MDM   1. Primary osteoarthritis of left knee    Depo-Medrol 40 mg and bupivacaine 0.25% 4 cc injected into the left knee as above. Patient to continue wearing knee sleeve that helps. Follow-up with your PCP next week as scheduled.    Janne Napoleon, NP 04/17/14 1340

## 2014-04-19 ENCOUNTER — Other Ambulatory Visit: Payer: Self-pay | Admitting: Hematology

## 2014-04-19 ENCOUNTER — Ambulatory Visit
Admission: RE | Admit: 2014-04-19 | Discharge: 2014-04-19 | Disposition: A | Discharge status: Home / Self Care | Payer: Medicare Other | Source: Ambulatory Visit | Attending: Hematology | Admitting: Hematology

## 2014-04-19 DIAGNOSIS — C50912 Malignant neoplasm of unspecified site of left female breast: Secondary | ICD-10-CM

## 2014-04-20 ENCOUNTER — Ambulatory Visit (INDEPENDENT_AMBULATORY_CARE_PROVIDER_SITE_OTHER): Payer: Medicare Other | Admitting: Internal Medicine

## 2014-04-20 ENCOUNTER — Encounter: Payer: Self-pay | Admitting: Internal Medicine

## 2014-04-20 ENCOUNTER — Other Ambulatory Visit (INDEPENDENT_AMBULATORY_CARE_PROVIDER_SITE_OTHER): Payer: Medicare Other

## 2014-04-20 VITALS — BP 122/80 | HR 74 | Temp 98.6°F | Resp 18 | Ht 65.0 in | Wt 182.0 lb

## 2014-04-20 DIAGNOSIS — R739 Hyperglycemia, unspecified: Secondary | ICD-10-CM | POA: Diagnosis not present

## 2014-04-20 DIAGNOSIS — E785 Hyperlipidemia, unspecified: Secondary | ICD-10-CM | POA: Diagnosis not present

## 2014-04-20 DIAGNOSIS — J309 Allergic rhinitis, unspecified: Secondary | ICD-10-CM | POA: Diagnosis not present

## 2014-04-20 HISTORY — DX: Hyperglycemia, unspecified: R73.9

## 2014-04-20 LAB — BASIC METABOLIC PANEL
BUN: 17 mg/dL (ref 6–23)
CO2: 35 meq/L — AB (ref 19–32)
CREATININE: 0.71 mg/dL (ref 0.40–1.20)
Calcium: 9.5 mg/dL (ref 8.4–10.5)
Chloride: 102 mEq/L (ref 96–112)
GFR: 101.39 mL/min (ref >60.00)
Glucose, Bld: 89 mg/dL (ref 70–99)
Potassium: 4 mEq/L (ref 3.5–5.1)
Sodium: 138 mEq/L (ref 135–145)

## 2014-04-20 LAB — LIPID PANEL
Cholesterol: 189 mg/dL (ref 0–200)
HDL: 78 mg/dL (ref >39.00)
LDL Cholesterol: 100 mg/dL — ABNORMAL HIGH (ref 0–99)
NonHDL: 111
Total CHOL/HDL Ratio: 2
Triglycerides: 57 mg/dL (ref 0.0–149.0)
VLDL: 11.4 mg/dL (ref 0.0–40.0)

## 2014-04-20 LAB — HEPATIC FUNCTION PANEL
ALT: 21 U/L (ref 0–35)
AST: 25 U/L (ref 0–37)
Albumin: 3.9 g/dL (ref 3.5–5.2)
Alkaline Phosphatase: 67 U/L (ref 39–117)
BILIRUBIN TOTAL: 0.5 mg/dL (ref 0.2–1.2)
Bilirubin, Direct: 0.1 mg/dL (ref 0.0–0.3)
TOTAL PROTEIN: 7.6 g/dL (ref 6.0–8.3)

## 2014-04-20 LAB — HEMOGLOBIN A1C: HEMOGLOBIN A1C: 5.7 % (ref 4.6–6.5)

## 2014-04-20 MED ORDER — FLUTICASONE PROPIONATE 50 MCG/ACT NA SUSP: 2.0000 | Freq: Every day | NASAL | Status: DC

## 2014-04-20 MED ORDER — CETIRIZINE HCL 10 MG PO TABS: 10.0000 mg | ORAL_TABLET | Freq: Every day | ORAL | Status: DC

## 2014-04-20 MED ORDER — METHYLPREDNISOLONE ACETATE 80 MG/ML IJ SUSP
80.0000 mg | Freq: Once | INTRAMUSCULAR | Status: AC
  Administered 2014-04-20: 80 mg via INTRAMUSCULAR

## 2014-04-20 NOTE — Progress Notes (Signed)
Subjective:    Patient ID: Doris Zamora, female    DOB: [birth year], 81 y.o.   MRN: [id number]  HPI  Here to f/u,Does have several wks ongoing nasal allergy symptoms with clearish congestion, itch and sneezing, without fever, pain, ST, cough, swelling or wheezing.  No fever, Pt denies chest pain, increased sob or doe, wheezing, orthopnea, PND, increased LE swelling, palpitations, dizziness or syncope.  Pt denies new neurological symptoms such as new headache, or facial or extremity weakness or numbness   Pt denies polydipsia, polyuria, Past Medical History  Diagnosis Date  . ANXIETY   . HYPERLIPIDEMIA   . INSOMNIA-SLEEP DISORDER-UNSPEC   . WEIGHT LOSS   . Endometrial cancer 2007   Past Surgical History  Procedure Laterality Date  . Abdominal hysterectomy  2007  . Oophorectomy  2007  . Thyroidectomy, partial  1983    reports that she quit smoking about 20 years ago. Her smoking use included Cigarettes. She quit after 50 years of use. She has never used smokeless tobacco. She reports that she drinks about 4.2 oz of alcohol per week. She reports that she does not use illicit drugs. family history includes Cancer (age of onset: 52) in her other; Cancer (age of onset: 67) in her sister; Diabetes in her mother; Muscular dystrophy in her father. Allergies  Allergen Reactions  . Synthroid [Levothyroxine Sodium] Swelling    Happened yrs ago  . Pneumovax [Pneumococcal Polysaccharide Vaccine]    Current Outpatient Prescriptions on File Prior to Visit  Medication Sig Dispense Refill  . acetaminophen-codeine (TYLENOL #3) 300-30 MG per tablet Take 1-2 tablets by mouth every 6 (six) hours as needed for moderate pain. 15 tablet 0  . ALPHA-LIPOIC ACID 100 MG TABS Take by mouth daily.    Marland Kitchen ALPRAZolam (XANAX) 0.5 MG tablet Take 1 tablet (0.5 mg total) by mouth 3 (three) times daily as needed. 90 tablet 5  . anastrozole (ARIMIDEX) 1 MG tablet Take 1 tablet (1 mg total) by mouth daily. 30 tablet 5  .  CALCIUM-MAGNESUIUM-ZINC [phone] MG TABS Take by mouth daily.    . cholecalciferol (VITAMIN D) 1000 UNITS tablet Take 1,000 Units by mouth daily.    Marland Kitchen co-enzyme Q-10 30 MG capsule Take 100 mg by mouth daily.     . diphenhydramine-acetaminophen (TYLENOL PM) 25-500 MG TABS Take 1 tablet by mouth at bedtime as needed.    . Multiple Vitamins-Minerals (MULTIVITAMIN WITH MINERALS) tablet Take 1 tablet by mouth daily. Forward Gold    . Omega-3 Fatty Acids (FISH OIL) 1000 MG CPDR Take by mouth daily.    Marland Kitchen tiZANidine (ZANAFLEX) 4 MG tablet Take 1 tablet (4 mg total) by mouth every 6 (six) hours as needed for muscle spasms. 40 tablet 1  . zolpidem (AMBIEN) 5 MG tablet take 1 tablet by mouth at bedtime if needed for sleep 30 tablet 5   No current facility-administered medications on file prior to visit.     Review of Systems  Constitutional: Negative for unusual diaphoresis or night sweats HENT: Negative for ringing in ear or discharge Eyes: Negative for double vision or worsening visual disturbance.  Respiratory: Negative for choking and stridor.   Gastrointestinal: Negative for vomiting or other signifcant bowel change Genitourinary: Negative for hematuria or change in urine volume.  Musculoskeletal: Negative for other MSK pain or swelling Skin: Negative for color change and worsening wound.  Neurological: Negative for tremors and numbness other than noted  Psychiatric/Behavioral: Negative for decreased concentration or agitation other  than above       Objective:   Physical Exam BP 122/80 mmHg  Pulse 74  Temp(Src) 98.6 F (37 C) (Oral)  Resp 18  Ht 5\' 5"  (1.651 m)  Wt 182 lb (82.555 kg)  BMI 30.29 kg/m2  SpO2 98% VS noted,  Constitutional: Pt appears in no significant distress HENT: Head: NCAT.  Right Ear: External ear normal.  Left Ear: External ear normal.  Eyes: . Pupils are equal, round, and reactive to light. Conjunctivae and EOM are normal Bilat tm's with no erythema.  Max  sinus areas non tender.  Pharynx with mild erythema, no exudate Neck: Normal range of motion. Neck supple.  Cardiovascular: Normal rate and regular rhythm.   Pulmonary/Chest: Effort normal and breath sounds without rales or wheezing.  Neurological: Pt is alert. Not confused , motor grossly intact Skin: Skin is warm. No rash, no LE edema Psychiatric: Pt behavior is normal. No agitation.     Assessment & Plan:

## 2014-04-20 NOTE — Assessment & Plan Note (Signed)
stable overall by history and exam, recent data reviewed with pt, and pt to continue medical treatment as before,  to f/u any worsening symptoms or concerns Lab Results  Component Value Date   LDLCALC 119* 05/11/2013

## 2014-04-20 NOTE — Progress Notes (Signed)
Pre visit review using our clinic review tool, if applicable. No additional management support is needed unless otherwise documented below in the visit note. 

## 2014-04-20 NOTE — Assessment & Plan Note (Signed)
Mild to mod, for depomedrol IM today, then zyrtec/flonase asd,   to f/u any worsening symptoms or concerns, consider allergy referral

## 2014-04-20 NOTE — Assessment & Plan Note (Signed)
stable overall by history and exam, recent data reviewed with pt, and pt to continue medical treatment as before,  to f/u any worsening symptoms or concerns, for a1c today 

## 2014-04-20 NOTE — Patient Instructions (Signed)
You had the steroid shot today  Please take all new medication as prescribed - the zyrtec and flonase as directed  Please continue all other medications as before, and refills have been done if requested.  Please have the pharmacy call with any other refills you may need.  Please continue your efforts at being more active, low cholesterol diet, and weight control.  Please keep your appointments with your specialists as you may have planned  Please go to the LAB in the Basement (turn left off the elevator) for the tests to be done today  You will be contacted by phone if any changes need to be made immediately.  Otherwise, you will receive a letter about your results with an explanation, but please check with MyChart first.  Please remember to sign up for MyChart if you have not done so, as this will be important to you in the future with finding out test results, communicating by private email, and scheduling acute appointments online when needed.  Please return in 6 months, or sooner if needed

## 2014-05-04 ENCOUNTER — Encounter: Payer: Self-pay | Admitting: Hematology

## 2014-05-04 ENCOUNTER — Other Ambulatory Visit (HOSPITAL_BASED_OUTPATIENT_CLINIC_OR_DEPARTMENT_OTHER): Payer: Medicare Other

## 2014-05-04 ENCOUNTER — Ambulatory Visit (HOSPITAL_BASED_OUTPATIENT_CLINIC_OR_DEPARTMENT_OTHER): Payer: Medicare Other | Admitting: Hematology

## 2014-05-04 ENCOUNTER — Telehealth: Payer: Self-pay | Admitting: Hematology

## 2014-05-04 VITALS — BP 151/51 | HR 68 | Temp 97.5°F | Resp 18 | Ht 65.0 in | Wt 179.0 lb

## 2014-05-04 DIAGNOSIS — Z170 Estrogen receptor positive status [ER+]: Secondary | ICD-10-CM | POA: Diagnosis not present

## 2014-05-04 DIAGNOSIS — C50112 Malignant neoplasm of central portion of left female breast: Secondary | ICD-10-CM

## 2014-05-04 DIAGNOSIS — C50412 Malignant neoplasm of upper-outer quadrant of left female breast: Secondary | ICD-10-CM | POA: Diagnosis not present

## 2014-05-04 DIAGNOSIS — C50912 Malignant neoplasm of unspecified site of left female breast: Secondary | ICD-10-CM

## 2014-05-04 LAB — CBC WITH DIFFERENTIAL/PLATELET
BASO%: 1.1 % (ref 0.0–2.0)
BASOS ABS: 0.1 10*3/uL (ref 0.0–0.1)
EOS%: 0.6 % (ref 0.0–7.0)
Eosinophils Absolute: 0 10*3/uL (ref 0.0–0.5)
HEMATOCRIT: 43 % (ref 34.8–46.6)
HEMOGLOBIN: 13.8 g/dL (ref 11.6–15.9)
LYMPH%: 21.7 % (ref 14.0–49.7)
MCH: 30.3 pg (ref 25.1–34.0)
MCHC: 32.1 g/dL (ref 31.5–36.0)
MCV: 94.4 fL (ref 79.5–101.0)
MONO#: 0.5 10*3/uL (ref 0.1–0.9)
MONO%: 8.7 % (ref 0.0–14.0)
NEUT%: 67.9 % (ref 38.4–76.8)
NEUTROS ABS: 4.1 10*3/uL (ref 1.5–6.5)
PLATELETS: 205 10*3/uL (ref 145–400)
RBC: 4.56 10*6/uL (ref 3.70–5.45)
RDW: 13.9 % (ref 11.2–14.5)
WBC: 6 10*3/uL (ref 3.9–10.3)
lymph#: 1.3 10*3/uL (ref 0.9–3.3)

## 2014-05-04 LAB — COMPREHENSIVE METABOLIC PANEL (CC13)
ALT: 25 U/L (ref 0–55)
ANION GAP: 7 meq/L (ref 3–11)
AST: 29 U/L (ref 5–34)
Albumin: 3.5 g/dL (ref 3.5–5.0)
Alkaline Phosphatase: 74 U/L (ref 40–150)
BILIRUBIN TOTAL: 0.39 mg/dL (ref 0.20–1.20)
BUN: 13.5 mg/dL (ref 7.0–26.0)
CO2: 30 meq/L — AB (ref 22–29)
Calcium: 9.1 mg/dL (ref 8.4–10.4)
Chloride: 104 mEq/L (ref 98–109)
Creatinine: 0.7 mg/dL (ref 0.6–1.1)
EGFR: >90 mL/min/{1.73_m2} (ref >90)
Glucose: 88 mg/dl (ref 70–140)
Potassium: 4.5 mEq/L (ref 3.5–5.1)
Sodium: 141 mEq/L (ref 136–145)
TOTAL PROTEIN: 7.3 g/dL (ref 6.4–8.3)

## 2014-05-04 NOTE — Progress Notes (Signed)
[place]  [phone] [phone]  Clinic New Consult Note   Patient Care Team: Biagio Borg, MD as PCP - General (Internal Medicine) Eulis Manly. Gershon Crane, MD as Attending Physician (Ophthalmology) Autumn Messing III, MD as Consulting Physician (General Surgery) Truitt Merle, MD as Consulting Physician (Hematology) 05/04/2014  CHIEF COMPLAINT Follow-up breast cancer  Oncology History   Breast cancer, left breast   Staging form: Breast, AJCC 7th Edition     Clinical: T1c, N0 - Unsigned Endometrial cancer   Staging form: Corpus Uteri - Adenosarcoma, AJCC 7th Edition     Clinical: T1c, N0 - Unsigned       Endometrial cancer   08/12/2005 Initial Diagnosis Endometrial cancer, T1bN0, s/p hysrectomy     Breast cancer, left breast   01/13/2014 Imaging mammogram and US showed 3 masses at 6 o'clock, retroareolar and 2 o'clock area, measuring 0.7-1.7cm.    01/26/2014 Initial Diagnosis Breast cancer, left breast, multifocal (3 lesions, biopsy showed 2 similar lesions with lobular features, and the third lesion has ductal features).    02/10/2014 -  Anti-estrogen oral therapy Anastrozole 1 mg once daily.    CURRENT THERAPY: Anastrozole 1 mg once daily, started on 02/10/2014.  HISTORY OF INITIAL PRESENTING ILLNESS:  Doris Zamora 81 y.o. female is here because of left breast cancer.  She noticed left breast mass and nipple inversion about months ago. No tenderness or nipple discharge. She otherwise feels well. No change of appetite and weight. ROS (+) indigestion but otherwise negative.  She underwent mammogram and [place] which showed 3 masses at the 6:00, retroareola, and 2:00. The masses measures 1-1.7 cm by mammogram, 0.7-0.9 cm by ultrasound. She underwent subsequent biopsy of this 3 lesions which showed invasive mammary carcinoma, 2 of them showed similar morphology and lobular features, one showed ductal features. Both are ER and PR positive.  She tolerated the  biopsy well without complications. She feels well in general, denies any pain or new symptoms. No change in her appetite and weight recently. She was seen by breast surgeon Dr. Marlou Starks yesterday, who recommended mastectomy.  INTERIM HISTORY: [name] returns for follow-up. She has been tolerating anastrozole very well, without noticeable side effects. She is doing well overall, no significant pain, except chronic left knee pain from arthritis, dyspnea or other complaints. She functions well at home. She presented to clinic with her granddaughter today.   MEDICAL HISTORY:  Past Medical History  Diagnosis Date  . ANXIETY   . HYPERLIPIDEMIA   . INSOMNIA-SLEEP DISORDER-UNSPEC   . WEIGHT LOSS   . Endometrial cancer 2007  . Hyperglycemia 04/20/2014    SURGICAL HISTORY: Past Surgical History  Procedure Laterality Date  . Abdominal hysterectomy  2007  . Oophorectomy  2007  . Thyroidectomy, partial  1983   GYN HISTORY  Menarchal: 14 LMP: age of 77 Contraceptive: 1 yr  HRT: no  G3P3:   SOCIAL HISTORY: History   Social History  . Marital Status: Widowed    Spouse Name: N/A    Number of Children: 3  . Years of Education: N/A   Occupational History  . Not on file.   Social History Main Topics  . Smoking status: Former Smoker -- 50 years    Types: Cigarettes    Quit date: 02/10/1994  . Smokeless tobacco: Never Used  . Alcohol Use: 4.2 oz/week    7 Glasses of wine per week     Comment: 2 0z. HS to help her sleep  .  Drug Use: No     Comment: admiited to poat use of marjuana to help her sleep  . Sexual Activity: Not on file   Other Topics Concern  . 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 Diabetes Mother   . Muscular dystrophy Father   . Cancer Sister 11    pancreatic cancer   . Cancer Other 40    breast cancer     ALLERGIES:  is allergic to  synthroid and pneumovax.  MEDICATIONS:  Current Outpatient Prescriptions  Medication Sig Dispense Refill  . acetaminophen (TYLENOL EX ST ARTHRITIS PAIN) 500 MG tablet Take 500 mg by mouth every 6 (six) hours as needed.    Marland Kitchen acetaminophen (TYLENOL) 650 MG CR tablet Take 1,300 mg by mouth every 8 (eight) hours as needed for pain.    . ALPHA-LIPOIC ACID 100 MG TABS Take by mouth daily.    Marland Kitchen ALPRAZolam (XANAX) 0.5 MG tablet Take 1 tablet (0.5 mg total) by mouth 3 (three) times daily as needed. 90 tablet 5        . CALCIUM-MAGNESUIUM-ZINC [id number] MG TABS Take by mouth daily.    . cholecalciferol (VITAMIN D) 1000 UNITS tablet Take 1,000 Units by mouth daily.    Marland Kitchen co-enzyme Q-10 30 MG capsule Take 100 mg by mouth daily.     . Multiple Vitamins-Minerals (MULTIVITAMIN WITH MINERALS) tablet Take 1 tablet by mouth daily. Forward Gold    . Omega-3 Fatty Acids (FISH OIL) 1000 MG CPDR Take by mouth daily.    Marland Kitchen tiZANidine (ZANAFLEX) 4 MG tablet Take 1 tablet (4 mg total) by mouth every 6 (six) hours as needed for muscle spasms. 40 tablet 1  . zolpidem (AMBIEN) 5 MG tablet take 1 tablet by mouth at bedtime if needed for sleep 30 tablet 5   No current facility-administered medications for this visit.    REVIEW OF SYSTEMS:   Constitutional: Denies fevers, chills or abnormal night sweats Eyes: Denies blurriness of vision, double vision or watery eyes Ears, nose, mouth, throat, and face: Denies mucositis or sore throat Respiratory: Denies cough, dyspnea or wheezes Cardiovascular: Denies palpitation, chest discomfort or lower extremity swelling Gastrointestinal:  Denies nausea, heartburn or change in bowel habits Skin: Denies abnormal skin rashes Lymphatics: Denies new lymphadenopathy or easy bruising Neurological:Denies numbness, tingling or new weaknesses Behavioral/Psych: Mood is stable, no new changes  All other systems were reviewed with the patient and are negative.  PHYSICAL  EXAMINATION: ECOG PERFORMANCE STATUS: 0 - Asymptomatic  Filed Vitals:   05/04/14 0958  BP: 151/51  Pulse: 68  Temp: 97.5 F (36.4 C)  Resp: 18   Filed Weights   05/04/14 0958  Weight: 179 lb (81.194 kg)    GENERAL:alert, no distress and comfortable SKIN: skin color, texture, turgor are normal, no rashes or significant lesions EYES: normal, conjunctiva are pink and non-injected, sclera clear OROPHARYNX:no exudate, no erythema and lips, buccal mucosa, and tongue normal  NECK: supple, thyroid normal size, non-tender, without nodularity LYMPH:  no palpable lymphadenopathy in the cervical, axillary or inguinal LUNGS: clear to auscultation and percussion with normal breathing effort HEART: regular rate & rhythm and no murmurs and no lower extremity edema ABDOMEN:abdomen soft, non-tender and normal bowel sounds Musculoskeletal:no cyanosis of digits and no clubbing  PSYCH: alert & oriented x 3 with  fluent speech NEURO: no focal motor/sensory deficits Breasts: Breast inspection showed them to be symmetrical, (+) left nipple inversion. There is a 2 x 3 cm mass inferior to her left breast nipple, no significant change from before, no other palpable mass in the left breast or right breast. Palpation of the bilateral  axilla revealed no obvious mass that I could appreciate.  LABORATORY DATA:  I have reviewed the data as listed Lab Results  Component Value Date   WBC 6.0 05/04/2014   HGB 13.8 05/04/2014   HCT 43.0 05/04/2014   MCV 94.4 05/04/2014   PLT 205 05/04/2014    Recent Labs  05/11/13 1017 05/12/13 1206 03/09/14 0920 04/20/14 1220 05/04/14 0945  NA 137  --  141 138 141  K 4.4  --  4.2 4.0 4.5  CL 101  --   --  102  --   CO2 30  --  27 35* 30*  GLUCOSE 76  --  151* 89 88  BUN 14  --  14.0 17 13.5  CREATININE 0.7  --  0.8 0.71 0.7  CALCIUM 9.3  --  9.0 9.5 9.1  PROT  --  8.2 7.2 7.6 7.3  ALBUMIN  --  3.8 3.3* 3.9 3.5  AST  --  39* 29 25 29   ALT  --  38* 23 21 25    ALKPHOS  --  70 66 67 74  BILITOT  --  0.6 0.54 0.5 0.39  BILIDIR  --  0.1  --  0.1  --    PATHOLOGY REPORT Diagnosis 1. Breast, left, needle core biopsy, mass, lower inner, 6:30 o'clock - INVASIVE MAMMARY CARCINOMA. - SEE COMMENT. 2. Breast, left, needle core biopsy, mass, retroareolar - INVASIVE MAMMARY CARCINOMA. - MAMMARY CARCINOMA IN SITU. - SEE COMMENT. 3. Breast, left, needle core biopsy, mass, 2:30 o'clock - INVASIVE MAMMARY CARCINOMA. - MAMMARY CARCINOMA IN SITU WITH CALCIFICATIONS. - SEE COMMENT. Microscopic Comment 1. The carcinoma is grade I and has some lobular features. 2. The carcinoma on part 2 is morphologically identical to that in part 1. 3. The carcinoma appears grade II and is likely a ductal phenotype. Breast prognostic profile will be performed on parts 1 and 3 and the results reported separately. The results were called to [facility] on 01/27/14.  Estrogen Receptor: 100%, POSITIVE, STRONG STAINING INTENSITY Progesterone Receptor: 100%, POSITIVE, STRONG STAINING INTENSITY Proliferation Marker Ki67: 29%  Results: HER-2/NEU BY CISH - NO AMPLIFICATION OF HER-2 DETECTED. RESULT RATIO OF HER2: CEP 17 SIGNALS 0.94 AVERAGE HER2 COPY NUMBER PER CELL 1.65   RADIOGRAPHIC STUDIES: I have personally reviewed the radiological images as listed and agreed with the findings in the report.  Mm Digital Diagnostic Bilat 04/19/2014  1. Left breast 2 o'clock 4 cm from the nipple measuring 0.9 x 0.8 x 0.6 cm in size (previously 0.9 x 0.8 x 0.6 cm) 2. Left breast 6:30 o'clock 3 cm from the nipple measuring 0.7 x 0.7 x 0.9 cm in size (previously 0.9 x 0.5 x 0.8 cm in size). 3. Left breast subareolar measuring 0.6 x 0.5 x 0.4 cm in size (previously 0.5 x 0.5 x 0.6 cm size).  IMPRESSION: 3 known malignancies within the left breast without significant interval change by mammography or ultrasound.      ASSESSMENT & PLAN:  81 year old female with  past medical history of stage I endometrial cancer, status post hysterectomy, anxiety and insomnia, who was recently diagnosed with left breast cancer.  1.  Left breast cancer, 3 synchronized lesions, 2 of them with lobular features and one has ductal features. T1b-1cN0M0, stage I, ER+/PR+/HER2- -I reviewed her breast mass biopsy findings and image findings with patient and her daughter extensively. She has 3 synchronized early stage breast cancer without clinical lymph nodes involvement. I discussed that surgical resection, likely mastectomy due to the location of this 3 masses, would be the only curative treatment for her breast cancer. However she is very hesitated to have surgery up front due to the hospitalization and potential complications. -Giving her lobular features, strong ER/PR positivity, I recommend endocrine therapy, i.e. aromatase inhibitor, in the setting of neoadjuvant or adjuvant. Giving her unwillingness to have upfront surgery, I recommend her to start anastrozole. She has been tolerating the well, we'll continue. -Her repeated mammogram and ultrasound showed stable breast lesions, no significant change in 3 months -We'll repeat mammogram and ultrasound in 6 months -We discussed the option of surgery again today, she is not willing to consider at this point.  2. Genetics -She has personal history of endometrial cancer, family history of breast cancer and pancreatic cancer. I discussed the genetic screening for inherited breast cancer and lynch syndrome, she declined at this point.  She will continue to follow-up with her primary care physician for other medical issues.  Plan #1 continue Arimidex -RTC in 3 month with lab    Truitt Merle  05/04/2014

## 2014-05-04 NOTE — Telephone Encounter (Signed)
Pt confirmed labs/ov per 03/24 POF, gave pt AVS and Calendar.... KJ

## 2014-07-07 ENCOUNTER — Ambulatory Visit (HOSPITAL_BASED_OUTPATIENT_CLINIC_OR_DEPARTMENT_OTHER): Payer: Medicare Other | Admitting: Hematology

## 2014-07-07 ENCOUNTER — Encounter: Payer: Self-pay | Admitting: Hematology

## 2014-07-07 ENCOUNTER — Other Ambulatory Visit (HOSPITAL_BASED_OUTPATIENT_CLINIC_OR_DEPARTMENT_OTHER): Payer: Medicare Other

## 2014-07-07 ENCOUNTER — Telehealth: Payer: Self-pay | Admitting: Hematology

## 2014-07-07 VITALS — BP 131/50 | HR 65 | Temp 98.0°F | Resp 18 | Ht 65.0 in | Wt 179.6 lb

## 2014-07-07 DIAGNOSIS — C50812 Malignant neoplasm of overlapping sites of left female breast: Secondary | ICD-10-CM

## 2014-07-07 DIAGNOSIS — C50412 Malignant neoplasm of upper-outer quadrant of left female breast: Secondary | ICD-10-CM | POA: Diagnosis not present

## 2014-07-07 DIAGNOSIS — C50112 Malignant neoplasm of central portion of left female breast: Secondary | ICD-10-CM

## 2014-07-07 DIAGNOSIS — Z170 Estrogen receptor positive status [ER+]: Secondary | ICD-10-CM

## 2014-07-07 DIAGNOSIS — C50912 Malignant neoplasm of unspecified site of left female breast: Secondary | ICD-10-CM

## 2014-07-07 LAB — COMPREHENSIVE METABOLIC PANEL (CC13)
ALBUMIN: 3.2 g/dL — AB (ref 3.5–5.0)
ALT: 24 U/L (ref 0–55)
ANION GAP: 9 meq/L (ref 3–11)
AST: 29 U/L (ref 5–34)
Alkaline Phosphatase: 79 U/L (ref 40–150)
BILIRUBIN TOTAL: 0.45 mg/dL (ref 0.20–1.20)
BUN: 11.6 mg/dL (ref 7.0–26.0)
CO2: 28 mEq/L (ref 22–29)
CREATININE: 0.7 mg/dL (ref 0.6–1.1)
Calcium: 8.9 mg/dL (ref 8.4–10.4)
Chloride: 103 mEq/L (ref 98–109)
EGFR: 89 mL/min/{1.73_m2} — AB (ref >90)
Glucose: 119 mg/dl (ref 70–140)
POTASSIUM: 4 meq/L (ref 3.5–5.1)
SODIUM: 141 meq/L (ref 136–145)
TOTAL PROTEIN: 7 g/dL (ref 6.4–8.3)

## 2014-07-07 LAB — CBC WITH DIFFERENTIAL/PLATELET
BASO%: 1.1 % (ref 0.0–2.0)
Basophils Absolute: 0 10*3/uL (ref 0.0–0.1)
EOS%: 2.2 % (ref 0.0–7.0)
Eosinophils Absolute: 0.1 10*3/uL (ref 0.0–0.5)
HCT: 40.8 % (ref 34.8–46.6)
HEMOGLOBIN: 13.5 g/dL (ref 11.6–15.9)
LYMPH#: 1.1 10*3/uL (ref 0.9–3.3)
LYMPH%: 24.2 % (ref 14.0–49.7)
MCH: 31.1 pg (ref 25.1–34.0)
MCHC: 33.2 g/dL (ref 31.5–36.0)
MCV: 93.8 fL (ref 79.5–101.0)
MONO#: 0.4 10*3/uL (ref 0.1–0.9)
MONO%: 9 % (ref 0.0–14.0)
NEUT#: 2.9 10*3/uL (ref 1.5–6.5)
NEUT%: 63.5 % (ref 38.4–76.8)
Platelets: 212 10*3/uL (ref 145–400)
RBC: 4.35 10*6/uL (ref 3.70–5.45)
RDW: 13.3 % (ref 11.2–14.5)
WBC: 4.5 10*3/uL (ref 3.9–10.3)

## 2014-07-07 NOTE — Telephone Encounter (Signed)
Pt confirmed labs/ov per 05/27 POF, gave pt AVS and Calendar.... KJ

## 2014-07-07 NOTE — Progress Notes (Signed)
[place]  [phone] [phone]  Clinic New Consult Note   Patient Care Team: Biagio Borg, MD as PCP - General (Internal Medicine) Eulis Manly. Gershon Crane, MD as Attending Physician (Ophthalmology) Autumn Messing III, MD as Consulting Physician (General Surgery) Truitt Merle, MD as Consulting Physician (Hematology) 07/07/2014  CHIEF COMPLAINT Follow-up breast cancer  Oncology History   Breast cancer, left breast   Staging form: Breast, AJCC 7th Edition     Clinical: T1c, N0 - Unsigned Endometrial cancer   Staging form: Corpus Uteri - Adenosarcoma, AJCC 7th Edition     Clinical: T1c, N0 - Unsigned       Endometrial cancer   08/12/2005 Initial Diagnosis Endometrial cancer, T1bN0, s/p hysrectomy     Breast cancer, left breast   01/13/2014 Imaging mammogram and US showed 3 masses at 6 o'clock, retroareolar and 2 o'clock area, measuring 0.7-1.7cm.    01/26/2014 Initial Diagnosis Breast cancer, left breast, multifocal (3 lesions, biopsy showed 2 similar lesions with lobular features, and the third lesion has ductal features).    02/10/2014 -  Anti-estrogen oral therapy Anastrozole 1 mg once daily.    CURRENT THERAPY: Anastrozole 1 mg once daily, started on 02/10/2014.  HISTORY OF INITIAL PRESENTING ILLNESS:  Doris Zamora 81 y.o. female is here because of left breast cancer.  She noticed left breast mass and nipple inversion about months ago. No tenderness or nipple discharge. She otherwise feels well. No change of appetite and weight. ROS (+) indigestion but otherwise negative.  She underwent mammogram and [place] which showed 3 masses at the 6:00, retroareola, and 2:00. The masses measures 1-1.7 cm by mammogram, 0.7-0.9 cm by ultrasound. She underwent subsequent biopsy of this 3 lesions which showed invasive mammary carcinoma, 2 of them showed similar morphology and lobular features, one showed ductal features. Both are ER and PR positive.  She tolerated the  biopsy well without complications. She feels well in general, denies any pain or new symptoms. No change in her appetite and weight recently. She was seen by breast surgeon Dr. Marlou Starks yesterday, who recommended mastectomy, and she declined upfront surgery.  INTERIM HISTORY: Doris Zamora returns for follow-up. She has been tolerating anastrozole very well, without noticeable side effects. She has mild left knee pain, no worse lately. She remains active at home and goes out for shopping etc. No hot flush, or other new complains. She exercise twice a week.    MEDICAL HISTORY:  Past Medical History  Diagnosis Date  . ANXIETY   . HYPERLIPIDEMIA   . INSOMNIA-SLEEP DISORDER-UNSPEC   . WEIGHT LOSS   . Endometrial cancer 2007  . Hyperglycemia 04/20/2014    SURGICAL HISTORY: Past Surgical History  Procedure Laterality Date  . Abdominal hysterectomy  2007  . Oophorectomy  2007  . Thyroidectomy, partial  1983   GYN HISTORY  Menarchal: 14 LMP: age of 76 Contraceptive: 1 yr  HRT: no  G3P3:   SOCIAL HISTORY: History   Social History  . Marital Status: Widowed    Spouse Name: N/A    Number of Children: 3  . Years of Education: N/A   Occupational History  . Not on file.   Social History Main Topics  . Smoking status: Former Smoker -- 50 years    Types: Cigarettes    Quit date: 02/10/1994  . Smokeless tobacco: Never Used  . Alcohol Use: 4.2 oz/week    7 Glasses of wine per week     Comment: 2 0z. HS  to help her sleep  . Drug Use: No     Comment: admiited to poat use of marjuana to help her sleep  . Sexual Activity: Not on file   Other Topics Concern  . 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 Diabetes Mother   . Muscular dystrophy Father   . Cancer Sister 106    pancreatic cancer   . Cancer Other 40    breast cancer     ALLERGIES:  is allergic to  synthroid and pneumovax.  MEDICATIONS:  Current Outpatient Prescriptions  Medication Sig Dispense Refill  . acetaminophen (TYLENOL EX ST ARTHRITIS PAIN) 500 MG tablet Take 500 mg by mouth every 6 (six) hours as needed.    Marland Kitchen acetaminophen (TYLENOL) 650 MG CR tablet Take 1,300 mg by mouth every 8 (eight) hours as needed for pain.    . ALPHA-LIPOIC ACID 100 MG TABS Take by mouth daily.    Marland Kitchen ALPRAZolam (XANAX) 0.5 MG tablet Take 1 tablet (0.5 mg total) by mouth 3 (three) times daily as needed. 90 tablet 5        . CALCIUM-MAGNESUIUM-ZINC 333-133-8.3 MG TABS Take by mouth daily.    . cholecalciferol (VITAMIN D) 1000 UNITS tablet Take 1,000 Units by mouth daily.    Marland Kitchen co-enzyme Q-10 30 MG capsule Take 100 mg by mouth daily.     . Multiple Vitamins-Minerals (MULTIVITAMIN WITH MINERALS) tablet Take 1 tablet by mouth daily. Forward Gold    . Omega-3 Fatty Acids (FISH OIL) 1000 MG CPDR Take by mouth daily.    Marland Kitchen tiZANidine (ZANAFLEX) 4 MG tablet Take 1 tablet (4 mg total) by mouth every 6 (six) hours as needed for muscle spasms. 40 tablet 1  . zolpidem (AMBIEN) 5 MG tablet take 1 tablet by mouth at bedtime if needed for sleep 30 tablet 5   No current facility-administered medications for this visit.    REVIEW OF SYSTEMS:   Constitutional: Denies fevers, chills or abnormal night sweats Eyes: Denies blurriness of vision, double vision or watery eyes Ears, nose, mouth, throat, and face: Denies mucositis or sore throat Respiratory: Denies cough, dyspnea or wheezes Cardiovascular: Denies palpitation, chest discomfort or lower extremity swelling Gastrointestinal:  Denies nausea, heartburn or change in bowel habits Skin: Denies abnormal skin rashes Lymphatics: Denies new lymphadenopathy or easy bruising Neurological:Denies numbness, tingling or new weaknesses Behavioral/Psych: Mood is stable, no new changes  All other systems were reviewed with the patient and are negative.  PHYSICAL  EXAMINATION: ECOG PERFORMANCE STATUS: 0 - Asymptomatic  Filed Vitals:   07/07/14 0926  BP: 131/50  Pulse: 65  Temp: 98 F (36.7 C)  Resp: 18   Filed Weights   07/07/14 0926  Weight: 179 lb 9.6 oz (81.466 kg)    GENERAL:alert, no distress and comfortable SKIN: skin color, texture, turgor are normal, no rashes or significant lesions EYES: normal, conjunctiva are pink and non-injected, sclera clear OROPHARYNX:no exudate, no erythema and lips, buccal mucosa, and tongue normal  NECK: supple, thyroid normal size, non-tender, without nodularity LYMPH:  no palpable lymphadenopathy in the cervical, axillary or inguinal LUNGS: clear to auscultation and percussion with normal breathing effort HEART: regular rate & rhythm and no murmurs and no lower extremity edema ABDOMEN:abdomen soft, non-tender and normal bowel sounds Musculoskeletal:no cyanosis of digits and no clubbing  PSYCH: alert & oriented x 3 with fluent speech NEURO: no focal motor/sensory deficits Breasts: Breast inspection showed them to be symmetrical, (+) left nipple inversion. There is a 2 x 1.5 cm mass inferior to her left breast nipple, smaller than before, no other palpable mass in the left breast or right breast. Palpation of the bilateral  axilla revealed no obvious mass that I could appreciate.  LABORATORY DATA:  I have reviewed the data as listed CBC Latest Ref Rng 07/07/2014 05/04/2014 03/09/2014  WBC 3.9 - 10.3 10e3/uL 4.5 6.0 5.2  Hemoglobin 11.6 - 15.9 g/dL 13.5 13.8 13.1  Hematocrit 34.8 - 46.6 % 40.8 43.0 40.4  Platelets 145 - 400 10e3/uL 212 205 203    CMP Latest Ref Rng 07/07/2014 05/04/2014 04/20/2014  Glucose 70 - 140 mg/dl 119 88 89  BUN 7.0 - 26.0 mg/dL 11.6 13.5 17  Creatinine 0.6 - 1.1 mg/dL 0.7 0.7 0.71  Sodium 136 - 145 mEq/L 141 141 138  Potassium 3.5 - 5.1 mEq/L 4.0 4.5 4.0  Chloride 96 - 112 mEq/L - - 102  CO2 22 - 29 mEq/L 28 30(H) 35(H)  Calcium 8.4 - 10.4 mg/dL 8.9 9.1 9.5  Total Protein 6.4  - 8.3 g/dL 7.0 7.3 7.6  Total Bilirubin 0.20 - 1.20 mg/dL 0.45 0.39 0.5  Alkaline Phos 40 - 150 U/L 79 74 67  AST 5 - 34 U/L _0 ALT 0 - 55 U/L _1 PATHOLOGY REPORT Diagnosis 1. Breast, left, needle core biopsy, mass, lower inner, 6:30 o'clock - INVASIVE MAMMARY CARCINOMA. - SEE COMMENT. 2. Breast, left, needle core biopsy, mass, retroareolar - INVASIVE MAMMARY CARCINOMA. - MAMMARY CARCINOMA IN SITU. - SEE COMMENT. 3. Breast, left, needle core biopsy, mass, 2:30 o'clock - INVASIVE MAMMARY CARCINOMA. - MAMMARY CARCINOMA IN SITU WITH CALCIFICATIONS. - SEE COMMENT. Microscopic Comment 1. The carcinoma is grade I and has some lobular features. 2. The carcinoma on part 2 is morphologically identical to that in part 1. 3. The carcinoma appears grade II and is likely a ductal phenotype. Breast prognostic profile will be performed on parts 1 and 3 and the results reported separately. The results were called to [facility] on 01/27/14.  Estrogen Receptor: 100%, POSITIVE, STRONG STAINING INTENSITY Progesterone Receptor: 100%, POSITIVE, STRONG STAINING INTENSITY Proliferation Marker Ki67: 29%  Results: HER-2/NEU BY CISH - NO AMPLIFICATION OF HER-2 DETECTED. RESULT RATIO OF HER2: CEP 17 SIGNALS 0.94 AVERAGE HER2 COPY NUMBER PER CELL 1.65   RADIOGRAPHIC STUDIES: I have personally reviewed the radiological images as listed and agreed with the findings in the report.  Mm Digital Diagnostic Bilat 04/19/2014  1. Left breast 2 o'clock 4 cm from the nipple measuring 0.9 x 0.8 x 0.6 cm in size (previously 0.9 x 0.8 x 0.6 cm) 2. Left breast 6:30 o'clock 3 cm from the nipple measuring 0.7 x 0.7 x 0.9 cm in size (previously 0.9 x 0.5 x 0.8 cm in size). 3. Left breast subareolar measuring 0.6 x 0.5 x 0.4 cm in size (previously 0.5 x 0.5 x 0.6 cm size).  IMPRESSION: 3 known malignancies within the left breast without significant interval change by  mammography or ultrasound.      ASSESSMENT & PLAN:  81 year old female with past medical history of stage I endometrial cancer, status post hysterectomy, anxiety and insomnia, who was recently diagnosed with left breast cancer.  1. Left breast cancer, 3 synchronized lesions, 2 of them with  lobular features and one has ductal features. T1b-1cN0M0, stage I, ER+/PR+/HER2- -I reviewed her breast mass biopsy findings and image findings with patient and her daughter extensively. She has 3 synchronized early stage breast cancer without clinical lymph nodes involvement. I discussed that surgical resection, likely mastectomy due to the location of this 3 masses, would be the only curative treatment for her breast cancer. However she is very hesitated to have surgery up front due to the hospitalization and potential complications. -Giving her lobular features, strong ER/PR positivity, I recommend endocrine therapy, i.e. aromatase inhibitor, in the setting of neoadjuvant or adjuvant. Giving her unwillingness to have upfront surgery, I recommend her to start anastrozole. She has been tolerating the well, we'll continue. -Her repeated mammogram and ultrasound showed stable breast lesions, no significant change in 3 months -We'll repeat mammogram and ultrasound in 6 months -We discussed the option of surgery again today, she is not willing to consider at this point.  2. Genetics -She has personal history of endometrial cancer, family history of breast cancer and pancreatic cancer. I discussed the genetic screening for inherited breast cancer and lynch syndrome, she declined at this point.  She will continue to follow-up with her primary care physician for other medical issues.  Plan #1 continue Arimidex -RTC in 3 month with lab and repeated diagnostic mammogram and ultrasound to evaluate her response.  Truitt Merle  07/07/2014

## 2014-08-28 ENCOUNTER — Other Ambulatory Visit: Payer: Self-pay | Admitting: Hematology

## 2014-08-28 DIAGNOSIS — C50912 Malignant neoplasm of unspecified site of left female breast: Secondary | ICD-10-CM

## 2014-08-29 NOTE — Telephone Encounter (Signed)
Pt calling that she is out of arimidex. She has appt with Dr Burr Medico next month. A 1 month Rx sent until she sees Dr Burr Medico. Pt is agreeable.

## 2014-09-12 ENCOUNTER — Ambulatory Visit (INDEPENDENT_AMBULATORY_CARE_PROVIDER_SITE_OTHER): Payer: Medicare Other | Admitting: Sports Medicine

## 2014-09-12 ENCOUNTER — Encounter: Payer: Self-pay | Admitting: Sports Medicine

## 2014-09-12 VITALS — BP 135/55 | HR 70 | Ht 65.0 in | Wt 180.0 lb

## 2014-09-12 DIAGNOSIS — M1712 Unilateral primary osteoarthritis, left knee: Secondary | ICD-10-CM | POA: Diagnosis present

## 2014-09-12 MED ORDER — METHYLPREDNISOLONE ACETATE 40 MG/ML IJ SUSP
40.0000 mg | Freq: Once | INTRAMUSCULAR | Status: AC
  Administered 2014-09-12: 40 mg via INTRA_ARTICULAR
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
## 2014-09-19 ENCOUNTER — Other Ambulatory Visit: Payer: Self-pay | Admitting: Hematology

## 2014-09-19 ENCOUNTER — Other Ambulatory Visit: Payer: Medicare Other

## 2014-09-19 ENCOUNTER — Ambulatory Visit
Admission: RE | Admit: 2014-09-19 | Discharge: 2014-09-19 | Disposition: A | Discharge status: Home / Self Care | Payer: Medicare Other | Source: Ambulatory Visit | Attending: Hematology | Admitting: Hematology

## 2014-09-19 ENCOUNTER — Telehealth: Payer: Self-pay | Admitting: Internal Medicine

## 2014-09-19 DIAGNOSIS — C50912 Malignant neoplasm of unspecified site of left female breast: Secondary | ICD-10-CM

## 2014-09-19 NOTE — Telephone Encounter (Signed)
Pt trying to getting a knee brace and medicare is needing you to call [phone] in order for her to receive it.  She gave me a release code [id number].

## 2014-09-20 NOTE — Telephone Encounter (Signed)
Per medical supply company, pt need to contact company directly to request supplies and contact her insurance company regarding payment.

## 2014-09-25 ENCOUNTER — Telehealth: Payer: Self-pay | Admitting: *Deleted

## 2014-09-25 ENCOUNTER — Other Ambulatory Visit: Payer: Medicare Other

## 2014-09-25 ENCOUNTER — Encounter: Payer: Medicare Other | Admitting: Hematology

## 2014-09-25 NOTE — Progress Notes (Signed)
This encounter was created in error - please disregard.

## 2014-09-25 NOTE — Telephone Encounter (Signed)
Pt called stating that she forgot about her appt this morning.  Rescheduled and confirmed pt to see Dr. Burr Medico on 09/29/14.

## 2014-09-29 ENCOUNTER — Ambulatory Visit (HOSPITAL_BASED_OUTPATIENT_CLINIC_OR_DEPARTMENT_OTHER): Payer: Medicare Other | Admitting: Hematology

## 2014-09-29 ENCOUNTER — Telehealth: Payer: Self-pay | Admitting: Hematology

## 2014-09-29 ENCOUNTER — Encounter: Payer: Self-pay | Admitting: Hematology

## 2014-09-29 ENCOUNTER — Other Ambulatory Visit (HOSPITAL_BASED_OUTPATIENT_CLINIC_OR_DEPARTMENT_OTHER): Payer: Medicare Other

## 2014-09-29 VITALS — BP 143/45 | HR 75 | Temp 98.2°F | Resp 17 | Ht 65.0 in | Wt 186.5 lb

## 2014-09-29 DIAGNOSIS — C50812 Malignant neoplasm of overlapping sites of left female breast: Secondary | ICD-10-CM

## 2014-09-29 DIAGNOSIS — C50412 Malignant neoplasm of upper-outer quadrant of left female breast: Secondary | ICD-10-CM

## 2014-09-29 DIAGNOSIS — C50912 Malignant neoplasm of unspecified site of left female breast: Secondary | ICD-10-CM

## 2014-09-29 DIAGNOSIS — C50112 Malignant neoplasm of central portion of left female breast: Secondary | ICD-10-CM

## 2014-09-29 DIAGNOSIS — Z170 Estrogen receptor positive status [ER+]: Secondary | ICD-10-CM | POA: Diagnosis not present

## 2014-09-29 LAB — COMPREHENSIVE METABOLIC PANEL (CC13)
ALT: 48 U/L (ref 0–55)
ANION GAP: 8 meq/L (ref 3–11)
AST: 43 U/L — ABNORMAL HIGH (ref 5–34)
Albumin: 3.2 g/dL — ABNORMAL LOW (ref 3.5–5.0)
Alkaline Phosphatase: 91 U/L (ref 40–150)
BUN: 14.5 mg/dL (ref 7.0–26.0)
CO2: 29 mEq/L (ref 22–29)
Calcium: 9.3 mg/dL (ref 8.4–10.4)
Chloride: 104 mEq/L (ref 98–109)
Creatinine: 0.8 mg/dL (ref 0.6–1.1)
EGFR: 77 mL/min/{1.73_m2} — AB (ref >90)
Glucose: 120 mg/dl (ref 70–140)
Potassium: 4.5 mEq/L (ref 3.5–5.1)
Sodium: 141 mEq/L (ref 136–145)
Total Bilirubin: 0.44 mg/dL (ref 0.20–1.20)
Total Protein: 7.1 g/dL (ref 6.4–8.3)

## 2014-09-29 LAB — CBC WITH DIFFERENTIAL/PLATELET
BASO%: 0.7 % (ref 0.0–2.0)
BASOS ABS: 0 10*3/uL (ref 0.0–0.1)
EOS ABS: 0.1 10*3/uL (ref 0.0–0.5)
EOS%: 2.1 % (ref 0.0–7.0)
HCT: 38.9 % (ref 34.8–46.6)
HGB: 12.7 g/dL (ref 11.6–15.9)
LYMPH%: 29.8 % (ref 14.0–49.7)
MCH: 30.7 pg (ref 25.1–34.0)
MCHC: 32.6 g/dL (ref 31.5–36.0)
MCV: 94.3 fL (ref 79.5–101.0)
MONO#: 0.6 10*3/uL (ref 0.1–0.9)
MONO%: 11.4 % (ref 0.0–14.0)
NEUT#: 3 10*3/uL (ref 1.5–6.5)
NEUT%: 56 % (ref 38.4–76.8)
PLATELETS: 223 10*3/uL (ref 145–400)
RBC: 4.12 10*6/uL (ref 3.70–5.45)
RDW: 13.3 % (ref 11.2–14.5)
WBC: 5.4 10*3/uL (ref 3.9–10.3)
lymph#: 1.6 10*3/uL (ref 0.9–3.3)

## 2014-09-29 MED ORDER — ANASTROZOLE 1 MG PO TABS: 1.0000 mg | ORAL_TABLET | Freq: Every day | ORAL | Status: DC

## 2014-09-29 NOTE — Telephone Encounter (Signed)
per pof to sch pt appt-gave pt copy of avs °

## 2014-09-29 NOTE — Progress Notes (Signed)
[place]  [phone] [phone]  Clinic New Consult Note   Patient Care Team: Biagio Borg, MD as PCP - General (Internal Medicine) Eulis Manly. Gershon Crane, MD as Attending Physician (Ophthalmology) Autumn Messing III, MD as Consulting Physician (General Surgery) Truitt Merle, MD as Consulting Physician (Hematology) 09/29/2014  CHIEF COMPLAINT Follow-up breast cancer  Oncology History   Breast cancer, left breast   Staging form: Breast, AJCC 7th Edition     Clinical: T1c, N0 - Unsigned Endometrial cancer   Staging form: Corpus Uteri - Adenosarcoma, AJCC 7th Edition     Clinical: T1c, N0 - Unsigned       Endometrial cancer   08/12/2005 Initial Diagnosis Endometrial cancer, T1bN0, s/p hysrectomy     Breast cancer, left breast   01/13/2014 Imaging mammogram and US showed 3 masses at 6 o'clock, retroareolar and 2 o'clock area, measuring 0.7-1.7cm.    01/26/2014 Initial Diagnosis Breast cancer, left breast, multifocal (3 lesions, biopsy showed 2 similar lesions with lobular features, and the third lesion has ductal features).    02/10/2014 -  Anti-estrogen oral therapy Anastrozole 1 mg once daily.    CURRENT THERAPY: Anastrozole 1 mg once daily, started on 02/10/2014.  HISTORY OF INITIAL PRESENTING ILLNESS:  Doris Zamora 81 y.o. female is here because of left breast cancer.  She noticed left breast mass and nipple inversion about months ago. No tenderness or nipple discharge. She otherwise feels well. No change of appetite and weight. ROS (+) indigestion but otherwise negative.  She underwent mammogram and [place] which showed 3 masses at the 6:00, retroareola, and 2:00. The masses measures 1-1.7 cm by mammogram, 0.7-0.9 cm by ultrasound. She underwent subsequent biopsy of this 3 lesions which showed invasive mammary carcinoma, 2 of them showed similar morphology and lobular features, one showed ductal features. Both are ER and PR positive.  She tolerated the  biopsy well without complications. She feels well in general, denies any pain or new symptoms. No change in her appetite and weight recently. She was seen by breast surgeon Dr. Marlou Starks yesterday, who recommended mastectomy, and she declined upfront surgery.  INTERIM HISTORY: Leanndra returns for follow-up. She has been tolerating anastrozole very well, without noticeable side effects. She recently had cortisone injection to left knee and she feels better. She has no hot flush, or other new complains.   MEDICAL HISTORY:  Past Medical History  Diagnosis Date  . ANXIETY   . HYPERLIPIDEMIA   . INSOMNIA-SLEEP DISORDER-UNSPEC   . WEIGHT LOSS   . Endometrial cancer 2007  . Hyperglycemia 04/20/2014    SURGICAL HISTORY: Past Surgical History  Procedure Laterality Date  . Abdominal hysterectomy  2007  . Oophorectomy  2007  . Thyroidectomy, partial  1983   GYN HISTORY  Menarchal: 14 LMP: age of 80 Contraceptive: 1 yr  HRT: no  G3P3:   SOCIAL HISTORY: History   Social History  . Marital Status: Widowed    Spouse Name: N/A    Number of Children: 3  . Years of Education: N/A   Occupational History  . Not on file.   Social History Main Topics  . Smoking status: Former Smoker -- 50 years    Types: Cigarettes    Quit date: 02/10/1994  . Smokeless tobacco: Never Used  . Alcohol Use: 4.2 oz/week    7 Glasses of wine per week     Comment: 2 0z. HS to help her sleep  . Drug Use: No  Comment: admiited to poat use of marjuana to help her sleep  . Sexual Activity: Not on file   Other Topics Concern  . 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 Diabetes Mother   . Muscular dystrophy Father   . Cancer Sister 24    pancreatic cancer   . Cancer Other 40    breast cancer     ALLERGIES:  is allergic to synthroid and pneumovax.  MEDICATIONS:  Current  Outpatient Prescriptions  Medication Sig Dispense Refill  . acetaminophen (TYLENOL EX ST ARTHRITIS PAIN) 500 MG tablet Take 500 mg by mouth every 6 (six) hours as needed.    Marland Kitchen acetaminophen (TYLENOL) 650 MG CR tablet Take 1,300 mg by mouth every 8 (eight) hours as needed for pain.    . ALPHA-LIPOIC ACID 100 MG TABS Take by mouth daily.    Marland Kitchen ALPRAZolam (XANAX) 0.5 MG tablet Take 1 tablet (0.5 mg total) by mouth 3 (three) times daily as needed. 90 tablet 5        . CALCIUM-MAGNESUIUM-ZINC 333-133-8.3 MG TABS Take by mouth daily.    . cholecalciferol (VITAMIN D) 1000 UNITS tablet Take 1,000 Units by mouth daily.    Marland Kitchen co-enzyme Q-10 30 MG capsule Take 100 mg by mouth daily.     . Multiple Vitamins-Minerals (MULTIVITAMIN WITH MINERALS) tablet Take 1 tablet by mouth daily. Forward Gold    . Omega-3 Fatty Acids (FISH OIL) 1000 MG CPDR Take by mouth daily.    Marland Kitchen tiZANidine (ZANAFLEX) 4 MG tablet Take 1 tablet (4 mg total) by mouth every 6 (six) hours as needed for muscle spasms. 40 tablet 1  . zolpidem (AMBIEN) 5 MG tablet take 1 tablet by mouth at bedtime if needed for sleep 30 tablet 5   No current facility-administered medications for this visit.    REVIEW OF SYSTEMS:   Constitutional: Denies fevers, chills or abnormal night sweats Eyes: Denies blurriness of vision, double vision or watery eyes Ears, nose, mouth, throat, and face: Denies mucositis or sore throat Respiratory: Denies cough, dyspnea or wheezes Cardiovascular: Denies palpitation, chest discomfort or lower extremity swelling Gastrointestinal:  Denies nausea, heartburn or change in bowel habits Skin: Denies abnormal skin rashes Lymphatics: Denies new lymphadenopathy or easy bruising Neurological:Denies numbness, tingling or new weaknesses Behavioral/Psych: Mood is stable, no new changes  All other systems were reviewed with the patient and are negative.  PHYSICAL EXAMINATION: ECOG PERFORMANCE STATUS: 1  Filed Vitals:    09/29/14 1401  BP: 143/45  Pulse: 75  Temp: 98.2 F (36.8 C)  Resp: 17   Filed Weights   09/29/14 1401  Weight: 186 lb 8 oz (84.596 kg)    GENERAL:alert, no distress and comfortable SKIN: skin color, texture, turgor are normal, no rashes or significant lesions EYES: normal, conjunctiva are pink and non-injected, sclera clear OROPHARYNX:no exudate, no erythema and lips, buccal mucosa, and tongue normal  NECK: supple, thyroid normal size, non-tender, without nodularity LYMPH:  no palpable lymphadenopathy in the cervical, axillary or inguinal LUNGS: clear to auscultation and percussion with normal breathing effort HEART: regular rate & rhythm and no murmurs and no lower extremity edema ABDOMEN:abdomen soft, non-tender and normal bowel sounds Musculoskeletal:no cyanosis of digits and no clubbing  PSYCH: alert & oriented x 3 with fluent speech NEURO: no focal motor/sensory deficits  Breasts: Breast inspection showed them to be symmetrical, (+) left nipple inversion. There is a 1.5 x 1.5 cm mass inferior to her left breast nipple, smaller than before, Davisn the left breast or right breast. Palpation of the bilateral  axilla revealed no obvious mass that I could appreciate.  LABORATORY DATA:  I have reviewed the data as listed CBC Latest Ref Rng 09/29/2014 07/07/2014 05/04/2014  WBC 3.9 - 10.3 10e3/uL 5.4 4.5 6.0  Hemoglobin 11.6 - 15.9 g/dL 12.7 13.5 13.8  Hematocrit 34.8 - 46.6 % 38.9 40.8 43.0  Platelets 145 - 400 10e3/uL 223 212 205    CMP Latest Ref Rng 09/29/2014 07/07/2014 05/04/2014  Glucose 70 - 140 mg/dl 120 119 88  BUN 7.0 - 26.0 mg/dL 14.5 11.6 13.5  Creatinine 0.6 - 1.1 mg/dL 0.8 0.7 0.7  Sodium 136 - 145 mEq/L 141 141 141  Potassium 3.5 - 5.1 mEq/L 4.5 4.0 4.5  Chloride 96 - 112 mEq/L - - -  CO2 22 - 29 mEq/L 29 28 30(H)  Calcium 8.4 - 10.4 mg/dL 9.3 8.9 9.1  Total Protein 6.4 - 8.3 g/dL 7.1 7.0 7.3  Total Bilirubin 0.20 - 1.20 mg/dL 0.44 0.45 0.39  Alkaline Phos 40 -  150 U/L 91 79 74  AST 5 - 34 U/L 43(H) 29 29  ALT 0 - 55 U/L 48 24 25     PATHOLOGY REPORT Diagnosis 1. Breast, left, needle core biopsy, mass, lower inner, 6:30 o'clock - INVASIVE MAMMARY CARCINOMA. - SEE COMMENT. 2. Breast, left, needle core biopsy, mass, retroareolar - INVASIVE MAMMARY CARCINOMA. - MAMMARY CARCINOMA IN SITU. - SEE COMMENT. 3. Breast, left, needle core biopsy, mass, 2:30 o'clock - INVASIVE MAMMARY CARCINOMA. - MAMMARY CARCINOMA IN SITU WITH CALCIFICATIONS. - SEE COMMENT. Microscopic Comment 1. The carcinoma is grade I and has some lobular features. 2. The carcinoma on part 2 is morphologically identical to that in part 1. 3. The carcinoma appears grade II and is likely a ductal phenotype. Breast prognostic profile will be performed on parts 1 and 3 and the results reported separately. The results were called to [facility] on 01/27/14.  Estrogen Receptor: 100%, POSITIVE, STRONG STAINING INTENSITY Progesterone Receptor: 100%, POSITIVE, STRONG STAINING INTENSITY Proliferation Marker Ki67: 29%  Results: HER-2/NEU BY CISH - NO AMPLIFICATION OF HER-2 DETECTED. RESULT RATIO OF HER2: CEP 17 SIGNALS 0.94 AVERAGE HER2 COPY NUMBER PER CELL 1.65   RADIOGRAPHIC STUDIES: I have personally reviewed the radiological images as listed and agreed with the findings in the report.  Mm Digital Diagnostic Bilat 04/19/2014  1. Left breast 2 o'clock 4 cm from the nipple measuring 0.9 x 0.8 x 0.6 cm in size (previously 0.9 x 0.8 x 0.6 cm) 2. Left breast 6:30 o'clock 3 cm from the nipple measuring 0.7 x 0.7 x 0.9 cm in size (previously 0.9 x 0.5 x 0.8 cm in size). 3. Left breast subareolar measuring 0.6 x 0.5 x 0.4 cm in size (previously 0.5 x 0.5 x 0.6 cm size).  IMPRESSION: 3 known malignancies within the left breast without significant interval change by mammography or ultrasound.  MM and [place] left breast 09/19/2014  Ultrasound targeted to the known  malignancy at the 2 o'clock location in the left breast measures 5 x 4 x 5 mm, previously 9 x 8 x 6 mm. The biopsy proven malignancy at 6:30 measures 7 x 6 x 5 mm, previously 8 x 7 x 5 mm. The biopsy marking clip in the subareolar  left breast is seen, without definite associated residual mass.  IMPRESSION: 1. Interval decrease in the size and density of the three previously biopsied sites of malignancy in the left breast.  2. Note is made of suspicious pleomorphic calcifications which have not been previously biopsied. These extend approximately 6 cm superior and posterior to the biopsy marking clip at the 2 o'clock location. This area appears stable.  RECOMMENDATION: Recommend continued treatment plan.    ASSESSMENT & PLAN:  81 year old female with past medical history of stage I endometrial cancer, status post hysterectomy, anxiety and insomnia, who was recently diagnosed with left breast cancer.  1. Left breast cancer, 3 synchronized lesions, 2 of them with lobular features and one has ductal features. T1b-1cN0M0, stage I, ER+/PR+/HER2- -I reviewed her breast mass biopsy findings and image findings with patient and her daughter extensively. She has 3 synchronized early stage breast cancer without clinical lymph nodes involvement. I discussed that surgical resection, likely mastectomy due to the location of this 3 masses, would be the only curative treatment for her breast cancer. However she is very hesitated to have surgery up front due to the hospitalization and potential complications. -Giving her lobular features, strong ER/PR positivity, I recommend endocrine therapy, i.e. aromatase inhibitor, in the setting of neoadjuvant or adjuvant. Giving her unwillingness to have upfront surgery, I recommended her to start anastrozole. She has been tolerating the well, we'll continue. -Her repeated mammogram and ultrasound showed partial response after 7 month of treatment. I reviewed the  images results with patient and her family members. -I encouraged her to consider surgery again. She is willing to take the lumpectomy, but not ready for mastectomy. Her daughter did encourage her. I'll give her more time to think about it. -Continue anastrozole.  2. Genetics -She has personal history of endometrial cancer, family history of breast cancer and pancreatic cancer. I discussed the genetic screening for inherited breast cancer and lynch syndrome, she declined at this point.  She will continue to follow-up with her primary care physician for other medical issues.  Plan - continue Arimidex -RTC in 3 month with lab.   Truitt Merle  09/29/2014

## 2014-12-25 ENCOUNTER — Other Ambulatory Visit: Payer: Self-pay | Admitting: Oncology

## 2014-12-25 ENCOUNTER — Telehealth: Payer: Self-pay | Admitting: Hematology

## 2014-12-25 NOTE — Telephone Encounter (Signed)
lvm for pt regarding to 11.21 appt time later per pof request....pt ok and aware

## 2015-01-01 ENCOUNTER — Encounter: Payer: Self-pay | Admitting: Oncology

## 2015-01-01 ENCOUNTER — Other Ambulatory Visit: Payer: Self-pay | Admitting: Hematology

## 2015-01-01 ENCOUNTER — Telehealth: Payer: Self-pay | Admitting: Oncology

## 2015-01-01 ENCOUNTER — Other Ambulatory Visit (HOSPITAL_BASED_OUTPATIENT_CLINIC_OR_DEPARTMENT_OTHER): Payer: Medicare Other

## 2015-01-01 ENCOUNTER — Ambulatory Visit: Payer: Medicare Other | Admitting: Hematology

## 2015-01-01 ENCOUNTER — Ambulatory Visit (HOSPITAL_BASED_OUTPATIENT_CLINIC_OR_DEPARTMENT_OTHER): Payer: Medicare Other | Admitting: Oncology

## 2015-01-01 ENCOUNTER — Ambulatory Visit: Payer: Medicare Other

## 2015-01-01 VITALS — BP 155/54 | HR 73 | Temp 98.1°F | Resp 20 | Ht 65.0 in | Wt 193.5 lb

## 2015-01-01 DIAGNOSIS — C50912 Malignant neoplasm of unspecified site of left female breast: Secondary | ICD-10-CM

## 2015-01-01 LAB — COMPREHENSIVE METABOLIC PANEL (CC13)
ALBUMIN: 3.5 g/dL (ref 3.5–5.0)
ALT: 62 U/L — AB (ref 0–55)
ANION GAP: 9 meq/L (ref 3–11)
AST: 62 U/L — ABNORMAL HIGH (ref 5–34)
Alkaline Phosphatase: 92 U/L (ref 40–150)
BUN: 15.3 mg/dL (ref 7.0–26.0)
CALCIUM: 9.7 mg/dL (ref 8.4–10.4)
CO2: 27 mEq/L (ref 22–29)
CREATININE: 0.7 mg/dL (ref 0.6–1.1)
Chloride: 103 mEq/L (ref 98–109)
EGFR: 87 mL/min/{1.73_m2} — AB (ref >90)
Glucose: 83 mg/dl (ref 70–140)
Potassium: 4.4 mEq/L (ref 3.5–5.1)
Sodium: 139 mEq/L (ref 136–145)
Total Bilirubin: 0.59 mg/dL (ref 0.20–1.20)
Total Protein: 8 g/dL (ref 6.4–8.3)

## 2015-01-01 LAB — CBC WITH DIFFERENTIAL/PLATELET
BASO%: 0.8 % (ref 0.0–2.0)
Basophils Absolute: 0.1 10*3/uL (ref 0.0–0.1)
EOS ABS: 0 10*3/uL (ref 0.0–0.5)
EOS%: 0.6 % (ref 0.0–7.0)
HEMATOCRIT: 41.5 % (ref 34.8–46.6)
HEMOGLOBIN: 13.4 g/dL (ref 11.6–15.9)
LYMPH#: 2.2 10*3/uL (ref 0.9–3.3)
LYMPH%: 35.6 % (ref 14.0–49.7)
MCH: 30.2 pg (ref 25.1–34.0)
MCHC: 32.3 g/dL (ref 31.5–36.0)
MCV: 93.5 fL (ref 79.5–101.0)
MONO#: 0.8 10*3/uL (ref 0.1–0.9)
MONO%: 12.7 % (ref 0.0–14.0)
NEUT%: 50.3 % (ref 38.4–76.8)
NEUTROS ABS: 3 10*3/uL (ref 1.5–6.5)
PLATELETS: 226 10*3/uL (ref 145–400)
RBC: 4.44 10*6/uL (ref 3.70–5.45)
RDW: 13.6 % (ref 11.2–14.5)
WBC: 6 10*3/uL (ref 3.9–10.3)

## 2015-01-01 MED ORDER — ANASTROZOLE 1 MG PO TABS: 1.0000 mg | ORAL_TABLET | Freq: Every day | ORAL | Status: DC

## 2015-01-01 NOTE — Progress Notes (Signed)
[facility]  [phone] [phone]  Clinic New Consult Note   Patient Care Team: Biagio Borg, MD as PCP - General (Internal Medicine) Eulis Manly. Gershon Crane, MD as Attending Physician (Ophthalmology) Autumn Messing III, MD as Consulting Physician (General Surgery) Truitt Merle, MD as Consulting Physician (Hematology) 01/01/2015  CHIEF COMPLAINT Follow-up breast cancer  Oncology History   Breast cancer, left breast   Staging form: Breast, AJCC 7th Edition     Clinical: T1c, N0 - Unsigned Endometrial cancer   Staging form: Corpus Uteri - Adenosarcoma, AJCC 7th Edition     Clinical: T1c, N0 - Unsigned       Endometrial cancer ([place])   08/12/2005 Initial Diagnosis Endometrial cancer, T1bN0, s/p hysrectomy     Breast cancer, left breast ([place])   01/13/2014 Imaging mammogram and US showed 3 masses at 6 o'clock, retroareolar and 2 o'clock area, measuring 0.7-1.7cm.    01/26/2014 Initial Diagnosis Breast cancer, left breast, multifocal (3 lesions, biopsy showed 2 similar lesions with lobular features, and the third lesion has ductal features).    02/10/2014 -  Anti-estrogen oral therapy Anastrozole 1 mg once daily.    CURRENT THERAPY: Anastrozole 1 mg once daily, started on 02/10/2014.  HISTORY OF INITIAL PRESENTING ILLNESS:  Doris Zamora 81 y.o. female is here because of left breast cancer.  She noticed left breast mass and nipple inversion about months ago. No tenderness or nipple discharge. She otherwise feels well. No change of appetite and weight. ROS (+) indigestion but otherwise negative.  She underwent mammogram and [place] which showed 3 masses at the 6:00, retroareola, and 2:00. The masses measures 1-1.7 cm by mammogram, 0.7-0.9 cm by ultrasound. She underwent subsequent biopsy of this 3 lesions which showed invasive mammary carcinoma, 2 of them showed similar morphology and lobular features, one showed ductal features. Both are ER and PR positive.  She  tolerated the biopsy well without complications. She feels well in general, denies any pain or new symptoms. No change in her appetite and weight recently.Has been seen by breast surgeon Dr. Marlou Starks in the past, who recommended mastectomy, and she declined upfront surgery.  INTERIM HISTORY: Doris Zamora returns for follow-up. She has been tolerating anastrozole very well, without noticeable side effects. She has no hot flashes, or other new complains. Does not notice a palpable breast mass.   MEDICAL HISTORY:  Past Medical History  Diagnosis Date  . ANXIETY   . HYPERLIPIDEMIA   . INSOMNIA-SLEEP DISORDER-UNSPEC   . WEIGHT LOSS   . Endometrial cancer ([place]) 2007  . Hyperglycemia 04/20/2014    SURGICAL HISTORY: Past Surgical History  Procedure Laterality Date  . Abdominal hysterectomy  2007  . Oophorectomy  2007  . Thyroidectomy, partial  1983   GYN HISTORY  Menarchal: 14 LMP: age of 63 Contraceptive: 1 yr  HRT: no  G3P3:   SOCIAL HISTORY: History   Social History  . Marital Status: Widowed    Spouse Name: N/A    Number of Children: 3  . Years of Education: N/A   Occupational History  . Not on file.   Social History Main Topics  . Smoking status: Former Smoker -- 50 years    Types: Cigarettes    Quit date: 02/10/1994  . Smokeless tobacco: Never Used  . Alcohol Use: 4.2 oz/week    7 Glasses of wine per week     Comment: 2 0z. HS to help her sleep  . Drug Use: No  Comment: admiited to poat use of marjuana to help her sleep  . Sexual Activity: Not on file   Other Topics Concern  . 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 Diabetes Mother   . Muscular dystrophy Father   . Cancer Sister 65    pancreatic cancer   . Cancer Other 40    breast cancer     ALLERGIES:  is allergic to synthroid and pneumovax.  MEDICATIONS:  Current Outpatient  Prescriptions  Medication Sig Dispense Refill  . acetaminophen (TYLENOL EX ST ARTHRITIS PAIN) 500 MG tablet Take 500 mg by mouth every 6 (six) hours as needed.    Marland Kitchen acetaminophen (TYLENOL) 650 MG CR tablet Take 1,300 mg by mouth every 8 (eight) hours as needed for pain.    . ALPHA-LIPOIC ACID 100 MG TABS Take by mouth daily.    Marland Kitchen ALPRAZolam (XANAX) 0.5 MG tablet Take 1 tablet (0.5 mg total) by mouth 3 (three) times daily as needed. 90 tablet 5        . CALCIUM-MAGNESUIUM-ZINC 333-133-8.3 MG TABS Take by mouth daily.    . cholecalciferol (VITAMIN D) 1000 UNITS tablet Take 1,000 Units by mouth daily.    Marland Kitchen co-enzyme Q-10 30 MG capsule Take 100 mg by mouth daily.     . Multiple Vitamins-Minerals (MULTIVITAMIN WITH MINERALS) tablet Take 1 tablet by mouth daily. Forward Gold    . Omega-3 Fatty Acids (FISH OIL) 1000 MG CPDR Take by mouth daily.    Marland Kitchen tiZANidine (ZANAFLEX) 4 MG tablet Take 1 tablet (4 mg total) by mouth every 6 (six) hours as needed for muscle spasms. 40 tablet 1  . zolpidem (AMBIEN) 5 MG tablet take 1 tablet by mouth at bedtime if needed for sleep 30 tablet 5   No current facility-administered medications for this visit.    REVIEW OF SYSTEMS:   Constitutional: Denies fevers, chills or abnormal night sweats Eyes: Denies blurriness of vision, double vision or watery eyes Ears, nose, mouth, throat, and face: Denies mucositis or sore throat Respiratory: Denies cough, dyspnea or wheezes Cardiovascular: Denies palpitation, chest discomfort or lower extremity swelling Gastrointestinal:  Denies nausea, heartburn or change in bowel habits Skin: Denies abnormal skin rashes Lymphatics: Denies new lymphadenopathy or easy bruising Neurological:Denies numbness, tingling or new weaknesses Behavioral/Psych: Mood is stable, no new changes  All other systems were reviewed with the patient and are negative.  PHYSICAL EXAMINATION: ECOG PERFORMANCE STATUS: 1  Filed Vitals:   01/01/15 1512  01/01/15 1518  BP: 146/54 155/54  Pulse: 73   Temp: 98.1 F (36.7 C)   Resp: 20    Filed Weights   01/01/15 1512  Weight: 193 lb 8 oz (87.771 kg)    GENERAL:alert, no distress and comfortable SKIN: skin color, texture, turgor are normal, no rashes or significant lesions EYES: normal, conjunctiva are pink and non-injected, sclera clear OROPHARYNX:no exudate, no erythema and lips, buccal mucosa, and tongue normal  NECK: supple, thyroid normal size, non-tender, without nodularity LYMPH:  no palpable lymphadenopathy in the cervical, axillary or inguinal LUNGS: clear to auscultation and percussion with normal breathing effort HEART: regular rate & rhythm and no murmurs and no lower extremity edema ABDOMEN:abdomen soft, non-tender and normal bowel sounds Musculoskeletal:no cyanosis of digits and no clubbing  PSYCH: alert & oriented x 3 with fluent  speech NEURO: no focal motor/sensory deficits Breasts: Breast inspection showed them to be symmetrical, (+) left nipple inversion. There is a 1.0 x 1.0 cm mass inferior to her left breast nipple, smaller than before, Davisn the left breast or right breast. Palpation of the bilateral  axilla revealed no obvious mass that I could appreciate.  LABORATORY DATA:  I have reviewed the data as listed CBC Latest Ref Rng 09/29/2014 07/07/2014 05/04/2014  WBC 3.9 - 10.3 10e3/uL 5.4 4.5 6.0  Hemoglobin 11.6 - 15.9 g/dL 12.7 13.5 13.8  Hematocrit 34.8 - 46.6 % 38.9 40.8 43.0  Platelets 145 - 400 10e3/uL 223 212 205    CMP Latest Ref Rng 09/29/2014 07/07/2014 05/04/2014  Glucose 70 - 140 mg/dl 120 119 88  BUN 7.0 - 26.0 mg/dL 14.5 11.6 13.5  Creatinine 0.6 - 1.1 mg/dL 0.8 0.7 0.7  Sodium 136 - 145 mEq/L 141 141 141  Potassium 3.5 - 5.1 mEq/L 4.5 4.0 4.5  Chloride 96 - 112 mEq/L - - -  CO2 22 - 29 mEq/L 29 28 30(H)  Calcium 8.4 - 10.4 mg/dL 9.3 8.9 9.1  Total Protein 6.4 - 8.3 g/dL 7.1 7.0 7.3  Total Bilirubin 0.20 - 1.20 mg/dL 0.44 0.45 0.39  Alkaline  Phos 40 - 150 U/L 91 79 74  AST 5 - 34 U/L 43(H) 29 29  ALT 0 - 55 U/L 48 24 25     PATHOLOGY REPORT Diagnosis 1. Breast, left, needle core biopsy, mass, lower inner, 6:30 o'clock - INVASIVE MAMMARY CARCINOMA. - SEE COMMENT. 2. Breast, left, needle core biopsy, mass, retroareolar - INVASIVE MAMMARY CARCINOMA. - MAMMARY CARCINOMA IN SITU. - SEE COMMENT. 3. Breast, left, needle core biopsy, mass, 2:30 o'clock - INVASIVE MAMMARY CARCINOMA. - MAMMARY CARCINOMA IN SITU WITH CALCIFICATIONS. - SEE COMMENT. Microscopic Comment 1. The carcinoma is grade I and has some lobular features. 2. The carcinoma on part 2 is morphologically identical to that in part 1. 3. The carcinoma appears grade II and is likely a ductal phenotype. Breast prognostic profile will be performed on parts 1 and 3 and the results reported separately. The results were called to [facility] on 01/27/14.  Estrogen Receptor: 100%, POSITIVE, STRONG STAINING INTENSITY Progesterone Receptor: 100%, POSITIVE, STRONG STAINING INTENSITY Proliferation Marker Ki67: 29%  Results: HER-2/NEU BY CISH - NO AMPLIFICATION OF HER-2 DETECTED. RESULT RATIO OF HER2: CEP 17 SIGNALS 0.94 AVERAGE HER2 COPY NUMBER PER CELL 1.65   RADIOGRAPHIC STUDIES: I have personally reviewed the radiological images as listed and agreed with the findings in the report.  Mm Digital Diagnostic Bilat 04/19/2014  1. Left breast 2 o'clock 4 cm from the nipple measuring 0.9 x 0.8 x 0.6 cm in size (previously 0.9 x 0.8 x 0.6 cm) 2. Left breast 6:30 o'clock 3 cm from the nipple measuring 0.7 x 0.7 x 0.9 cm in size (previously 0.9 x 0.5 x 0.8 cm in size). 3. Left breast subareolar measuring 0.6 x 0.5 x 0.4 cm in size (previously 0.5 x 0.5 x 0.6 cm size).  IMPRESSION: 3 known malignancies within the left breast without significant interval change by mammography or ultrasound.  MM and [place] left breast 09/19/2014  Ultrasound targeted to  the known malignancy at the 2 o'clock location in the left breast measures 5 x 4 x 5 mm, previously 9 x 8 x 6 mm. The biopsy proven malignancy at 6:30 measures 7 x 6 x 5 mm, previously 8 x 7 x 5 mm. The  biopsy marking clip in the subareolar left breast is seen, without definite associated residual mass.  IMPRESSION: 1. Interval decrease in the size and density of the three previously biopsied sites of malignancy in the left breast.  2. Note is made of suspicious pleomorphic calcifications which have not been previously biopsied. These extend approximately 6 cm superior and posterior to the biopsy marking clip at the 2 o'clock location. This area appears stable.  RECOMMENDATION: Recommend continued treatment plan.    ASSESSMENT & PLAN:  81 year old female with past medical history of stage I endometrial cancer, status post hysterectomy, anxiety and insomnia, who was recently diagnosed with left breast cancer.  1. Left breast cancer, 3 synchronized lesions, 2 of them with lobular features and one has ductal features. T1b-1cN0M0, stage I, ER+/PR+/HER2- -I reviewed her breast mass biopsy findings and image findings with patient and her daughter extensively. She has 3 synchronized early stage breast cancer without clinical lymph nodes involvement. I discussed that surgical resection, likely mastectomy due to the location of this 3 masses, would be the only curative treatment for her breast cancer. However she is very hesitated to have surgery up front due to the hospitalization and potential complications. -Giving her lobular features, strong ER/PR positivity, I recommend endocrine therapy, i.e. aromatase inhibitor, in the setting of neoadjuvant or adjuvant. Giving her unwillingness to have upfront surgery, I recommended her to start anastrozole. She has been tolerating the well, we'll continue. -Her repeated mammogram and ultrasound showed partial response after 7 month of treatment.  -I  encouraged her to consider surgery again. She is willing to take the lumpectomy, but not ready for mastectomy. I have offered a referral back to her surgeon to discuss this further, but she has declined today. -Continue anastrozole.  2. Genetics -She has personal history of endometrial cancer, family history of breast cancer and pancreatic cancer. I discussed the genetic screening for inherited breast cancer and lynch syndrome, she declined at this point.  She will continue to follow-up with her primary care physician for other medical issues.  Plan - continue Arimidex -RTC in 3 month with lab.   Mikey Bussing  01/01/2015

## 2015-01-01 NOTE — Telephone Encounter (Signed)
per po fto sch pt appt-gave pt copy of avs-sent back to lab °

## 2015-04-02 ENCOUNTER — Encounter: Payer: Self-pay | Admitting: Hematology

## 2015-04-02 ENCOUNTER — Other Ambulatory Visit: Payer: Self-pay | Admitting: Hematology

## 2015-04-02 DIAGNOSIS — C50912 Malignant neoplasm of unspecified site of left female breast: Secondary | ICD-10-CM

## 2015-04-02 NOTE — Progress Notes (Signed)
Called patient,(no answer, left message) advised Dr. Burr Medico has an family emergency,  need to cancel appt for  2/21/17someone from scheduling will be calling her back to reschedule.

## 2015-04-03 ENCOUNTER — Ambulatory Visit: Payer: Medicare Other | Admitting: Hematology

## 2015-04-03 ENCOUNTER — Other Ambulatory Visit: Payer: Medicare Other

## 2015-04-13 ENCOUNTER — Ambulatory Visit (INDEPENDENT_AMBULATORY_CARE_PROVIDER_SITE_OTHER): Payer: Medicare Other | Admitting: Family

## 2015-04-13 ENCOUNTER — Encounter: Payer: Self-pay | Admitting: Family

## 2015-04-13 VITALS — BP 118/70 | HR 63 | Temp 97.8°F | Resp 16 | Ht 65.0 in | Wt 190.0 lb

## 2015-04-13 DIAGNOSIS — J069 Acute upper respiratory infection, unspecified: Secondary | ICD-10-CM

## 2015-04-13 DIAGNOSIS — J309 Allergic rhinitis, unspecified: Secondary | ICD-10-CM

## 2015-04-13 MED ORDER — AZITHROMYCIN 250 MG PO TABS: ORAL_TABLET | ORAL | Status: DC

## 2015-04-13 MED ORDER — ALPRAZOLAM 0.5 MG PO TABS: 0.5000 mg | ORAL_TABLET | Freq: Three times a day (TID) | ORAL | Status: DC | PRN

## 2015-04-13 NOTE — Patient Instructions (Addendum)
Thank you for choosing [facility].  Summary/Instructions:  Your prescription(s) have been submitted to your pharmacy or been printed and provided for you. Please take as directed and contact our office if you believe you are having problem(s) with the medication(s) or have any questions.  If your symptoms worsen or fail to improve, please contact our office for further instruction, or in case of emergency go directly to the emergency room at the closest medical facility.   General Recommendations:    Please drink plenty of fluids.  Get plenty of rest   Sleep in humidified air  Use saline nasal sprays  Netti pot   OTC Medications:  Decongestants - helps relieve congestion   Flonase (generic fluticasone) or Nasacort (generic triamcinolone) - please make sure to use the "cross-over" technique at a 45 degree angle towards the opposite eye as opposed to straight up the nasal passageway.   Sudafed (generic pseudoephedrine - Note this is the one that is available behind the pharmacy counter); Products with phenylephrine (-PE) may also be used but is often not as effective as pseudoephedrine.   If you have HIGH BLOOD PRESSURE - Coricidin HBP; AVOID any product that is -D as this contains pseudoephedrine which may increase your blood pressure.  Afrin (oxymetazoline) every 6-8 hours for up to 3 days.   Allergies - helps relieve runny nose, itchy eyes and sneezing   Claritin (generic loratidine), Allegra (fexofenidine), or Zyrtec (generic cyrterizine) for runny nose. These medications should not cause drowsiness.  Note - Benadryl (generic diphenhydramine) may be used however may cause drowsiness  Cough -   Delsym or Robitussin (generic dextromethorphan)  Expectorants - helps loosen mucus to ease removal   Mucinex (generic guaifenesin) as directed on the package.  Headaches / General Aches   Tylenol (generic acetaminophen) - DO NOT EXCEED 3 grams (3,000 mg) in a 24  hour time period  Advil/Motrin (generic ibuprofen)   Sore Throat -   Salt water gargle   Chloraseptic (generic benzocaine) spray or lozenges / Sucrets (generic dyclonine)      Upper Respiratory Infection, Adult Most upper respiratory infections (URIs) are a viral infection of the air passages leading to the lungs. A URI affects the nose, throat, and upper air passages. The most common type of URI is nasopharyngitis and is typically referred to as "the common cold." URIs run their course and usually go away on their own. Most of the time, a URI does not require medical attention, but sometimes a bacterial infection in the upper airways can follow a viral infection. This is called a secondary infection. Sinus and middle ear infections are common types of secondary upper respiratory infections. Bacterial pneumonia can also complicate a URI. A URI can worsen asthma and chronic obstructive pulmonary disease (COPD). Sometimes, these complications can require emergency medical care and may be life threatening.  CAUSES Almost all URIs are caused by viruses. A virus is a type of germ and can spread from one person to another.  RISKS FACTORS You may be at risk for a URI if:  4. You smoke.  5. You have chronic heart or lung disease. 6. You have a weakened defense (immune) system.  2. You are very young or very old.  8. You have nasal allergies or asthma. 9. You work in crowded or poorly ventilated areas. 10. You work in health care facilities or schools. SIGNS AND SYMPTOMS  Symptoms typically develop 2-3 days after you come in contact with a cold virus.  Most viral URIs last 7-10 days. However, viral URIs from the influenza virus (flu virus) can last 14-18 days and are typically more severe. Symptoms may include:  2. Runny or stuffy (congested) nose.  3. Sneezing.  4. Cough.  5. Sore throat.  6. Headache.  7. Fatigue.  8. Fever.  9. Loss of appetite.  10. Pain in your forehead,  behind your eyes, and over your cheekbones (sinus pain). 11. Muscle aches.  DIAGNOSIS  Your health care provider may diagnose a URI by: 2. Physical exam. 3. Tests to check that your symptoms are not due to another condition such as: 1. Strep throat. 2. Sinusitis. 3. Pneumonia. 4. Asthma. TREATMENT  A URI goes away on its own with time. It cannot be cured with medicines, but medicines may be prescribed or recommended to relieve symptoms. Medicines may help: 3. Reduce your fever. 4. Reduce your cough. 5. Relieve nasal congestion. HOME CARE INSTRUCTIONS  3. Take medicines only as directed by your health care provider.  4. Gargle warm saltwater or take cough drops to comfort your throat as directed by your health care provider. 5. Use a warm mist humidifier or inhale steam from a shower to increase air moisture. This may make it easier to breathe. 6. Drink enough fluid to keep your urine clear or pale yellow.  7. Eat soups and other clear broths and maintain good nutrition.  8. Rest as needed.  9. Return to work when your temperature has returned to normal or as your health care provider advises. You may need to stay home longer to avoid infecting others. You can also use a face mask and careful hand washing to prevent spread of the virus. 10. Increase the usage of your inhaler if you have asthma.  11. Do not use any tobacco products, including cigarettes, chewing tobacco, or electronic cigarettes. If you need help quitting, ask your health care provider. PREVENTION  The best way to protect yourself from getting a cold is to practice good hygiene.  6. Avoid oral or hand contact with people with cold symptoms.  7. Wash your hands often if contact occurs.  There is no clear evidence that vitamin C, vitamin E, echinacea, or exercise reduces the chance of developing a cold. However, it is always recommended to get plenty of rest, exercise, and practice good nutrition.  SEEK MEDICAL CARE  IF:   You are getting worse rather than better.   Your symptoms are not controlled by medicine.   You have chills.  You have worsening shortness of breath.  You have brown or red mucus.  You have yellow or brown nasal discharge.  You have pain in your face, especially when you bend forward.  You have a fever.  You have swollen neck glands.  You have pain while swallowing.  You have white areas in the back of your throat. SEEK IMMEDIATE MEDICAL CARE IF:  2. You have severe or persistent: 1. Headache. 2. Ear pain. 3. Sinus pain. 4. Chest pain. 3. You have chronic lung disease and any of the following: 1. Wheezing. 2. Prolonged cough. 3. Coughing up blood. 4. A change in your usual mucus. 4. You have a stiff neck. 5. You have changes in your: 1. Vision. 2. Hearing. 3. Thinking. 4. Mood. MAKE SURE YOU:  3. Understand these instructions. 4. Will watch your condition. 5. Will get help right away if you are not doing well or get worse.   This information is not intended to replace  advice given to you by your health care provider. Make sure you discuss any questions you have with your health care provider.   Document Released: 07/23/2000 Document Revised: 06/13/2014 Document Reviewed: 05/04/2013 Elsevier Interactive Patient Education [employer].

## 2015-04-13 NOTE — Progress Notes (Signed)
Pre visit review using our clinic review tool, if applicable. No additional management support is needed unless otherwise documented below in the visit note. 

## 2015-04-13 NOTE — Progress Notes (Signed)
Subjective:    Patient ID: Doris Zamora, female    DOB: 01-21-1931, 82 y.o.   MRN: [id number]  Chief Complaint  Patient presents with  . Cough    x1 week, no appetite, runny nose, cough, has blisters in her nose that have spread, refill of xanax    HPI:  Doris Zamora is a 82 y.o. female who  has a past medical history of ANXIETY; HYPERLIPIDEMIA; INSOMNIA-SLEEP DISORDER-UNSPEC; WEIGHT LOSS; Endometrial cancer ([place]) (2007); and Hyperglycemia (04/20/2014). and presents today for an acute office visit.    This is a new problem. Associated symptoms of decreased appetite, runny nose, cough and a blister in her nose that has been going on for about 1 week. No fevers. Modifying factors include Mucinex and a mucus release cough syrup which provided minimal relief. Nasal discharge is clear and copious at times. Course of symptoms appear slightly improved. Does have occasional shortness of breath with exertion. No recent antibiotic use.    Allergies  Allergen Reactions  . Synthroid [Levothyroxine Sodium] Swelling    Happened yrs ago  . Pneumovax [Pneumococcal Polysaccharide Vaccine]     Current Outpatient Prescriptions on File Prior to Visit  Medication Sig Dispense Refill  . ALPHA-LIPOIC ACID 100 MG TABS Take by mouth daily.    Marland Kitchen anastrozole (ARIMIDEX) 1 MG tablet Take 1 tablet (1 mg total) by mouth daily. 30 tablet 5  . CALCIUM-MAGNESUIUM-ZINC 333-133-8.3 MG TABS Take by mouth daily.    . cetirizine (ZYRTEC) 10 MG tablet Take 1 tablet (10 mg total) by mouth daily. 30 tablet 11  . cholecalciferol (VITAMIN D) 1000 UNITS tablet Take 1,000 Units by mouth daily.    Marland Kitchen co-enzyme Q-10 30 MG capsule Take 100 mg by mouth daily.     . diphenhydramine-acetaminophen (TYLENOL PM) 25-500 MG TABS Take 1 tablet by mouth at bedtime as needed.    . fluticasone (FLONASE) 50 MCG/ACT nasal spray Place 2 sprays into both nostrils daily. 16 g 2  . Multiple Vitamins-Minerals (MULTIVITAMIN WITH MINERALS) tablet  Take 1 tablet by mouth daily. Forward Gold    . Omega-3 Fatty Acids (FISH OIL) 1000 MG CPDR Take by mouth daily.    Marland Kitchen OVER THE COUNTER MEDICATION Take by mouth at bedtime as needed. zzquil for sleep     No current facility-administered medications on file prior to visit.    Review of Systems  Constitutional: Negative for fever and chills.  HENT: Positive for congestion. Negative for sinus pressure and sore throat.   Respiratory: Negative for cough, chest tightness and shortness of breath.       Objective:    BP 118/70 mmHg  Pulse 63  Temp(Src) 97.8 F (36.6 C) (Oral)  Resp 16  Ht '5\' 5"'$  (1.651 m)  Wt 190 lb (86.183 kg)  BMI 31.62 kg/m2  SpO2 97% Nursing note and vital signs reviewed.  Physical Exam  Constitutional: She is oriented to person, place, and time. She appears well-developed and well-nourished. No distress.  HENT:  Right Ear: Hearing, tympanic membrane, external ear and ear canal normal.  Left Ear: Hearing, tympanic membrane, external ear and ear canal normal.  Nose: Nose normal. Right sinus exhibits no maxillary sinus tenderness and no frontal sinus tenderness. Left sinus exhibits no maxillary sinus tenderness and no frontal sinus tenderness.    Mouth/Throat: Uvula is midline.  Cardiovascular: Normal rate, regular rhythm, normal heart sounds and intact distal pulses.   Pulmonary/Chest: Effort normal and breath sounds normal.  Neurological: She is alert  and oriented to person, place, and time.  Skin: Skin is warm and dry.  Psychiatric: She has a normal mood and affect. Her behavior is normal. Judgment and thought content normal.       Assessment & Plan:   Problem List Items Addressed This Visit      Respiratory   Allergic rhinitis    Symptoms consistent with allergic rhinitis. Treat with second-generation antihistamines available over-the-counter. Follow-up if symptoms worsen or fail to improve.      Acute upper respiratory infection - Primary    Symptoms  and exam consistent with acute upper respiratory infection combined with allergic rhinitis. Appears viral with symptoms improving. Continue over-the-counter management as needed for symptom relief and supportive care. Written prescription for azithromycin provided with instructions for watchful waiting. Follow-up if symptoms worsen or do not improve.      Relevant Medications   azithromycin (ZITHROMAX) 250 MG tablet

## 2015-04-13 NOTE — Assessment & Plan Note (Signed)
Symptoms consistent with allergic rhinitis. Treat with second-generation antihistamines available over-the-counter. Follow-up if symptoms worsen or fail to improve.

## 2015-04-13 NOTE — Assessment & Plan Note (Signed)
Symptoms and exam consistent with acute upper respiratory infection combined with allergic rhinitis. Appears viral with symptoms improving. Continue over-the-counter management as needed for symptom relief and supportive care. Written prescription for azithromycin provided with instructions for watchful waiting. Follow-up if symptoms worsen or do not improve.

## 2015-04-19 ENCOUNTER — Telehealth: Payer: Self-pay | Admitting: Hematology

## 2015-04-19 NOTE — Telephone Encounter (Signed)
lvm fo rpt regarding to appt

## 2015-05-03 ENCOUNTER — Encounter: Payer: Self-pay | Admitting: Internal Medicine

## 2015-05-03 ENCOUNTER — Ambulatory Visit (INDEPENDENT_AMBULATORY_CARE_PROVIDER_SITE_OTHER): Payer: Medicare Other | Admitting: Internal Medicine

## 2015-05-03 VITALS — BP 142/82 | HR 66 | Temp 98.1°F | Resp 20 | Wt 191.0 lb

## 2015-05-03 DIAGNOSIS — F411 Generalized anxiety disorder: Secondary | ICD-10-CM | POA: Diagnosis not present

## 2015-05-03 DIAGNOSIS — R269 Unspecified abnormalities of gait and mobility: Secondary | ICD-10-CM | POA: Insufficient documentation

## 2015-05-03 DIAGNOSIS — R739 Hyperglycemia, unspecified: Secondary | ICD-10-CM

## 2015-05-03 DIAGNOSIS — E785 Hyperlipidemia, unspecified: Secondary | ICD-10-CM

## 2015-05-03 NOTE — Progress Notes (Signed)
Pre visit review using our clinic review tool, if applicable. No additional management support is needed unless otherwise documented below in the visit note. 

## 2015-05-03 NOTE — Assessment & Plan Note (Signed)
stable overall by history and exam, recent data reviewed with pt, and pt to continue medical treatment as before,  to f/u any worsening symptoms or concerns Lab Results  Component Value Date   LDLCALC 100* 04/20/2014   For lower chol diet, declines f/u lab

## 2015-05-03 NOTE — Assessment & Plan Note (Signed)
stable overall by history and exam, recent data reviewed with pt, and pt to continue medical treatment as before,  to f/u any worsening symptoms or concerns. Lab Results  Component Value Date   WBC 6.0 01/01/2015   HGB 13.4 01/01/2015   HCT 41.5 01/01/2015   PLT 226 01/01/2015   GLUCOSE 83 01/01/2015   CHOL 189 04/20/2014   TRIG 57.0 04/20/2014   HDL 78.00 04/20/2014   LDLDIRECT 129.4 02/14/2008   LDLCALC 100* 04/20/2014   ALT 62* 01/01/2015   AST 62* 01/01/2015   NA 139 01/01/2015   K 4.4 01/01/2015   CL 102 04/20/2014   CREATININE 0.7 01/01/2015   BUN 15.3 01/01/2015   CO2 27 01/01/2015   TSH 1.39 05/11/2013   HGBA1C 5.7 04/20/2014

## 2015-05-03 NOTE — Patient Instructions (Addendum)
You had the Prevnar pneumonia shot today  You are given the prescription for the Rollater with seat  It should be ok to take this to a medical supply store, such as [facility] on [address]  Please continue all other medications as before, and refills have been done if requested.  Please have the pharmacy call with any other refills you may need.  Please continue your efforts at being more active, low cholesterol diet, and weight control.  Please keep your appointments with your specialists as you may have planned  Please return in 6 months, or sooner if needed

## 2015-05-03 NOTE — Progress Notes (Signed)
Subjective:    Patient ID: Doris Zamora, female    DOB: 08-02-1930, 82 y.o.   MRN: [id number]  HPI  Here with daughter, overall doing ok,  Pt denies chest pain, increasing sob or doe, wheezing, orthopnea, PND, increased LE swelling, palpitations, dizziness or syncope.  Pt denies new neurological symptoms such as new headache, or facial or extremity weakness or numbness.  Pt denies polydipsia, polyuria, or low sugar episode.   Pt denies new neurological symptoms such as new headache, or facial or extremity weakness or numbness.   Pt states overall good compliance with meds, mostly trying to follow appropriate diet, with wt overall stable,  but little exercise however. Has walked with cane to date, does not want PT but has mild worsening gen'd weakness , as well as chronic bilat knee pain, asks for rollater with seat, cane not good enough for standing in line or shopping.  Sees Dr Ronna Polio s/p cortisone to left knee recently. No falls Denies worsening depressive symptoms, suicidal ideation, or panic Past Medical History  Diagnosis Date  . ANXIETY   . HYPERLIPIDEMIA   . INSOMNIA-SLEEP DISORDER-UNSPEC   . WEIGHT LOSS   . Endometrial cancer ([place]) 2007  . Hyperglycemia 04/20/2014   Past Surgical History  Procedure Laterality Date  . Abdominal hysterectomy  2007  . Oophorectomy  2007  . Thyroidectomy, partial  1983    reports that she quit smoking about 21 years ago. Her smoking use included Cigarettes. She quit after 50 years of use. She has never used smokeless tobacco. She reports that she drinks about 4.2 oz of alcohol per week. She reports that she does not use illicit drugs. family history includes Cancer (age of onset: 11) in her other; Cancer (age of onset: 86) in her sister; Diabetes in her mother; Muscular dystrophy in her father. Allergies  Allergen Reactions  . Synthroid [Levothyroxine Sodium] Swelling    Happened yrs ago  . Pneumovax [Pneumococcal Polysaccharide Vaccine]     Current Outpatient Prescriptions on File Prior to Visit  Medication Sig Dispense Refill  . ALPHA-LIPOIC ACID 100 MG TABS Take by mouth daily.    Marland Kitchen ALPRAZolam (XANAX) 0.5 MG tablet Take 1 tablet (0.5 mg total) by mouth 3 (three) times daily as needed. 30 tablet 0  . anastrozole (ARIMIDEX) 1 MG tablet Take 1 tablet (1 mg total) by mouth daily. 30 tablet 5  . CALCIUM-MAGNESUIUM-ZINC 333-133-8.3 MG TABS Take by mouth daily.    . cetirizine (ZYRTEC) 10 MG tablet Take 1 tablet (10 mg total) by mouth daily. 30 tablet 11  . cholecalciferol (VITAMIN D) 1000 UNITS tablet Take 1,000 Units by mouth daily.    Marland Kitchen co-enzyme Q-10 30 MG capsule Take 100 mg by mouth daily.     . diphenhydramine-acetaminophen (TYLENOL PM) 25-500 MG TABS Take 1 tablet by mouth at bedtime as needed.    . fluticasone (FLONASE) 50 MCG/ACT nasal spray Place 2 sprays into both nostrils daily. 16 g 2  . Multiple Vitamins-Minerals (MULTIVITAMIN WITH MINERALS) tablet Take 1 tablet by mouth daily. Forward Gold    . Omega-3 Fatty Acids (FISH OIL) 1000 MG CPDR Take by mouth daily.    Marland Kitchen OVER THE COUNTER MEDICATION Take by mouth at bedtime as needed. zzquil for sleep     No current facility-administered medications on file prior to visit.     Review of Systems  Constitutional: Negative for unusual diaphoresis or night sweats HENT: Negative for ringing in ear or discharge Eyes: Negative for  double vision or worsening visual disturbance.  Respiratory: Negative for choking and stridor.   Gastrointestinal: Negative for vomiting or other signifcant bowel change Genitourinary: Negative for hematuria or change in urine volume.  Musculoskeletal: Negative for other MSK pain or swelling Skin: Negative for color change and worsening wound.  Neurological: Negative for tremors and numbness other than noted  Psychiatric/Behavioral: Negative for decreased concentration or agitation other than above       Objective:   Physical Exam BP 142/82  mmHg  Pulse 66  Temp(Src) 98.1 F (36.7 C) (Oral)  Resp 20  Wt 191 lb (86.637 kg)  SpO2 97% VS noted,  Constitutional: Pt appears in no significant distress HENT: Head: NCAT.  Right Ear: External ear normal.  Left Ear: External ear normal.  Eyes: . Pupils are equal, round, and reactive to light. Conjunctivae and EOM are normal Neck: Normal range of motion. Neck supple.  Cardiovascular: Normal rate and regular rhythm.   Pulmonary/Chest: Effort normal and breath sounds without rales or wheezing.  Abd:  Soft, NT, ND, + BS Neurological: Pt is alert. Not confused , motor grossly intact but some general weakness, gait with cane still somewhat unsteady with balance impairment mild, slow Skin: Skin is warm. No rash, no LE edema Psychiatric: Pt behavior is normal. No agitation. not depressed affect    Assessment & Plan:

## 2015-05-03 NOTE — Assessment & Plan Note (Signed)
stable overall by history and exam, recent data reviewed with pt, and pt to continue medical treatment as before,  to f/u any worsening symptoms or concerns Lab Results  Component Value Date   HGBA1C 5.7 04/20/2014   Declines f/u lab, working on diet

## 2015-05-03 NOTE — Assessment & Plan Note (Signed)
[facility] for [employer] with seat, gave rx today,  to f/u any worsening symptoms or concerns

## 2015-05-07 ENCOUNTER — Telehealth: Payer: Self-pay | Admitting: Hematology

## 2015-05-07 ENCOUNTER — Encounter: Payer: Self-pay | Admitting: Hematology

## 2015-05-07 ENCOUNTER — Ambulatory Visit (HOSPITAL_BASED_OUTPATIENT_CLINIC_OR_DEPARTMENT_OTHER): Payer: Medicare Other | Admitting: Hematology

## 2015-05-07 ENCOUNTER — Other Ambulatory Visit (HOSPITAL_BASED_OUTPATIENT_CLINIC_OR_DEPARTMENT_OTHER): Payer: Medicare Other

## 2015-05-07 VITALS — BP 149/53 | HR 64 | Temp 97.6°F | Resp 18 | Ht 65.0 in | Wt 190.8 lb

## 2015-05-07 DIAGNOSIS — C50412 Malignant neoplasm of upper-outer quadrant of left female breast: Secondary | ICD-10-CM | POA: Diagnosis not present

## 2015-05-07 DIAGNOSIS — C50112 Malignant neoplasm of central portion of left female breast: Secondary | ICD-10-CM

## 2015-05-07 DIAGNOSIS — C50812 Malignant neoplasm of overlapping sites of left female breast: Secondary | ICD-10-CM

## 2015-05-07 DIAGNOSIS — Z170 Estrogen receptor positive status [ER+]: Secondary | ICD-10-CM | POA: Diagnosis not present

## 2015-05-07 DIAGNOSIS — C50912 Malignant neoplasm of unspecified site of left female breast: Secondary | ICD-10-CM

## 2015-05-07 DIAGNOSIS — Z8542 Personal history of malignant neoplasm of other parts of uterus: Secondary | ICD-10-CM | POA: Diagnosis not present

## 2015-05-07 LAB — CBC WITH DIFFERENTIAL/PLATELET
BASO%: 1 % (ref 0.0–2.0)
Basophils Absolute: 0.1 10*3/uL (ref 0.0–0.1)
EOS%: 1.7 % (ref 0.0–7.0)
Eosinophils Absolute: 0.1 10*3/uL (ref 0.0–0.5)
HCT: 40.7 % (ref 34.8–46.6)
HGB: 13.1 g/dL (ref 11.6–15.9)
LYMPH%: 36.1 % (ref 14.0–49.7)
MCH: 30 pg (ref 25.1–34.0)
MCHC: 32.2 g/dL (ref 31.5–36.0)
MCV: 93.1 fL (ref 79.5–101.0)
MONO#: 0.6 10*3/uL (ref 0.1–0.9)
MONO%: 11 % (ref 0.0–14.0)
NEUT%: 50.2 % (ref 38.4–76.8)
NEUTROS ABS: 2.7 10*3/uL (ref 1.5–6.5)
Platelets: 220 10*3/uL (ref 145–400)
RBC: 4.37 10*6/uL (ref 3.70–5.45)
RDW: 13.8 % (ref 11.2–14.5)
WBC: 5.4 10*3/uL (ref 3.9–10.3)
lymph#: 2 10*3/uL (ref 0.9–3.3)

## 2015-05-07 LAB — COMPREHENSIVE METABOLIC PANEL
ALT: 26 U/L (ref 0–55)
ANION GAP: 5 meq/L (ref 3–11)
AST: 30 U/L (ref 5–34)
Albumin: 3.1 g/dL — ABNORMAL LOW (ref 3.5–5.0)
Alkaline Phosphatase: 68 U/L (ref 40–150)
BILIRUBIN TOTAL: 0.43 mg/dL (ref 0.20–1.20)
BUN: 12.1 mg/dL (ref 7.0–26.0)
CO2: 30 mEq/L — ABNORMAL HIGH (ref 22–29)
CREATININE: 0.8 mg/dL (ref 0.6–1.1)
Calcium: 9.2 mg/dL (ref 8.4–10.4)
Chloride: 106 mEq/L (ref 98–109)
EGFR: 83 mL/min/{1.73_m2} — AB (ref >90)
Glucose: 102 mg/dl (ref 70–140)
Potassium: 4.1 mEq/L (ref 3.5–5.1)
SODIUM: 141 meq/L (ref 136–145)
TOTAL PROTEIN: 7.7 g/dL (ref 6.4–8.3)

## 2015-05-07 MED ORDER — ANASTROZOLE 1 MG PO TABS: 1.0000 mg | ORAL_TABLET | Freq: Every day | ORAL | Status: DC

## 2015-05-07 NOTE — Telephone Encounter (Signed)
per pof to sch pt appt-sch and will call pt w/appt after scheduling mamma/US

## 2015-05-07 NOTE — Progress Notes (Signed)
[facility]  Telephone:([phone]) [phone] Fax:(336) [phone]  Clinic Follow Up Note   Patient Care Team: Biagio Borg, MD as PCP - General (Internal Medicine) Eulis Manly. Gershon Crane, MD as Attending Physician (Ophthalmology) Autumn Messing III, MD as Consulting Physician (General Surgery) Truitt Merle, MD as Consulting Physician (Hematology) 05/07/2015  CHIEF COMPLAINT Follow-up breast cancer  Oncology History   Breast cancer, left breast   Staging form: Breast, AJCC 7th Edition     Clinical: T1c, N0 - Unsigned Endometrial cancer   Staging form: Corpus Uteri - Adenosarcoma, AJCC 7th Edition     Clinical: T1c, N0 - Unsigned       Endometrial cancer ([place])   08/12/2005 Initial Diagnosis Endometrial cancer, T1bN0, s/p hysrectomy     Breast cancer, left breast ([place])   01/13/2014 Imaging mammogram and US showed 3 masses at 6 o'clock, retroareolar and 2 o'clock area, measuring 0.7-1.7cm.    01/26/2014 Initial Diagnosis Breast cancer, left breast, multifocal (3 lesions, biopsy showed 2 similar lesions with lobular features, and the third lesion has ductal features).    02/10/2014 -  Anti-estrogen oral therapy Pt declined surgery, started anastrozole 1 mg once daily.    09/19/2014 Mammogram  I asked her mammogram and ultrasound showed interval decrease in the size and density of the 3 previous biopsy sites of malignancy in the left breast.   CURRENT THERAPY: Anastrozole 1 mg once daily, started on 02/10/2014.  HISTORY OF INITIAL PRESENTING ILLNESS:  Doris Zamora 82 y.o. female is here because of left breast cancer.  She noticed left breast mass and nipple inversion about months ago. No tenderness or nipple discharge. She otherwise feels well. No change of appetite and weight. ROS (+) indigestion but otherwise negative.  She underwent mammogram and [place] which showed 3 masses at the 6:00, retroareola, and 2:00. The masses measures 1-1.7 cm by mammogram, 0.7-0.9 cm by ultrasound. She underwent  subsequent biopsy of this 3 lesions which showed invasive mammary carcinoma, 2 of them showed similar morphology and lobular features, one showed ductal features. Both are ER and PR positive.  She tolerated the biopsy well without complications. She feels well in general, denies any pain or new symptoms. No change in her appetite and weight recently. She was seen by breast surgeon Dr. Marlou Starks yesterday, who recommended mastectomy, and she declined upfront surgery.  INTERIM HISTORY: Doris Zamora returns for follow-up. She  Is doing very well overall. She is compliant with anastrozole, and tolerates it well without significant side effects. She has chronic arthritis and left knee pain, received multiple injections lately, with some relief. She walks with a cane. She remains to be physically active, participated group exercise twice a week. She lives with her son.  No other new complaints.  MEDICAL HISTORY:  Past Medical History  Diagnosis Date  . ANXIETY   . HYPERLIPIDEMIA   . INSOMNIA-SLEEP DISORDER-UNSPEC   . WEIGHT LOSS   . Endometrial cancer ([place]) 2007  . Hyperglycemia 04/20/2014    SURGICAL HISTORY: Past Surgical History  Procedure Laterality Date  . Abdominal hysterectomy  2007  . Oophorectomy  2007  . Thyroidectomy, partial  1983   GYN HISTORY  Menarchal: 14 LMP: age of 18 Contraceptive: 1 yr  HRT: no  G3P3:   SOCIAL HISTORY: History   Social History  . Marital Status: Widowed    Spouse Name: N/A    Number of Children: 3  . Years of Education: N/A   Occupational History  . Not on file.  Social History Main Topics  . Smoking status: Former Smoker -- 50 years    Types: Cigarettes    Quit date: 02/10/1994  . Smokeless tobacco: Never Used  . Alcohol Use: 4.2 oz/week    7 Glasses of wine per week     Comment: 2 0z. HS to help her sleep  . Drug Use: No     Comment: admiited to poat use of marjuana to help her sleep  . Sexual Activity: Not on file   Other Topics Concern    . 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 Diabetes Mother   . Muscular dystrophy Father   . Cancer Sister 47    pancreatic cancer   . Cancer Other 40    breast cancer     ALLERGIES:  is allergic to synthroid and pneumovax.  MEDICATIONS:    Medication List       This list is accurate as of: 05/07/15  3:02 PM.  Always use your most recent med list.               Alpha-Lipoic Acid 100 MG Tabs  Take by mouth daily.     ALPRAZolam 0.5 MG tablet  Commonly known as:  XANAX  Take 1 tablet (0.5 mg total) by mouth 3 (three) times daily as needed.     anastrozole 1 MG tablet  Commonly known as:  ARIMIDEX  Take 1 tablet (1 mg total) by mouth daily.     CALCIUM-MAGNESUIUM-ZINC 333-133-8.3 MG Tabs  Take by mouth daily.     cetirizine 10 MG tablet  Commonly known as:  ZYRTEC  Take 1 tablet (10 mg total) by mouth daily.     cholecalciferol 1000 units tablet  Commonly known as:  VITAMIN D  Take 1,000 Units by mouth daily.     co-enzyme Q-10 30 MG capsule  Take 100 mg by mouth daily.     diphenhydramine-acetaminophen 25-500 MG Tabs tablet  Commonly known as:  TYLENOL PM  Take 1 tablet by mouth at bedtime as needed.     Fish Oil 1000 MG Cpdr  Take by mouth daily.     fluticasone 50 MCG/ACT nasal spray  Commonly known as:  FLONASE  Place 2 sprays into both nostrils daily.     multivitamin with minerals tablet  Take 1 tablet by mouth daily. Forward Gold     OVER THE COUNTER MEDICATION  Take by mouth at bedtime as needed. zzquil for sleep       REVIEW OF SYSTEMS:   Constitutional: Denies fevers, chills or abnormal night sweats Eyes: Denies blurriness of vision, double vision or watery eyes Ears, nose, mouth, throat, and face: Denies mucositis or sore throat Respiratory: Denies cough, dyspnea or wheezes Cardiovascular: Denies palpitation,  chest discomfort or lower extremity swelling Gastrointestinal:  Denies nausea, heartburn or change in bowel habits Skin: Denies abnormal skin rashes Lymphatics: Denies new lymphadenopathy or easy bruising Neurological:Denies numbness, tingling or new weaknesses Behavioral/Psych: Mood is stable, no new changes  All other systems were reviewed with the patient and are negative.  PHYSICAL EXAMINATION: ECOG PERFORMANCE STATUS: 1  Filed Vitals:   05/07/15 1438  BP: 149/53  Pulse: 64  Temp: 97.6 F (36.4 C)  Resp: 18   Filed Weights   05/07/15 1438  Weight: 190 lb  12.8 oz (86.546 kg)    GENERAL:alert, no distress and comfortable SKIN: skin color, texture, turgor are normal, no rashes or significant lesions EYES: normal, conjunctiva are pink and non-injected, sclera clear OROPHARYNX:no exudate, no erythema and lips, buccal mucosa, and tongue normal  NECK: supple, thyroid normal size, non-tender, without nodularity LYMPH:  no palpable lymphadenopathy in the cervical, axillary or inguinal LUNGS: clear to auscultation and percussion with normal breathing effort HEART: regular rate & rhythm and no murmurs and no lower extremity edema ABDOMEN:abdomen soft, non-tender and normal bowel sounds Musculoskeletal:no cyanosis of digits and no clubbing  PSYCH: alert & oriented x 3 with fluent speech NEURO: no focal motor/sensory deficits Breasts: Breast inspection showed them to be symmetrical, (+) left nipple inversion. There is a 1.0 x 1.0 cm mass inferior to her left breast nipple, smaller than before. Palpation of the bilateral  axilla revealed no obvious mass that I could appreciate.  LABORATORY DATA:  I have reviewed the data as listed CBC Latest Ref Rng 05/07/2015 01/01/2015 09/29/2014  WBC 3.9 - 10.3 10e3/uL 5.4 6.0 5.4  Hemoglobin 11.6 - 15.9 g/dL 13.1 13.4 12.7  Hematocrit 34.8 - 46.6 % 40.7 41.5 38.9  Platelets 145 - 400 10e3/uL 220 226 223    CMP Latest Ref Rng 05/07/2015  01/01/2015 09/29/2014  Glucose 70 - 140 mg/dl 102 83 120  BUN 7.0 - 26.0 mg/dL 12.1 15.3 14.5  Creatinine 0.6 - 1.1 mg/dL 0.8 0.7 0.8  Sodium 136 - 145 mEq/L 141 139 141  Potassium 3.5 - 5.1 mEq/L 4.1 4.4 4.5  CO2 22 - 29 mEq/L 30(H) 27 29  Calcium 8.4 - 10.4 mg/dL 9.2 9.7 9.3  Total Protein 6.4 - 8.3 g/dL 7.7 8.0 7.1  Total Bilirubin 0.20 - 1.20 mg/dL 0.43 0.59 0.44  Alkaline Phos 40 - 150 U/L 68 92 91  AST 5 - 34 U/L 30 62(H) 43(H)  ALT 0 - 55 U/L 26 62(H) 48     PATHOLOGY REPORT Diagnosis 01/26/2014 1. Breast, left, needle core biopsy, mass, lower inner, 6:30 o'clock - INVASIVE MAMMARY CARCINOMA. - SEE COMMENT. 2. Breast, left, needle core biopsy, mass, retroareolar - INVASIVE MAMMARY CARCINOMA. - MAMMARY CARCINOMA IN SITU. - SEE COMMENT. 3. Breast, left, needle core biopsy, mass, 2:30 o'clock - INVASIVE MAMMARY CARCINOMA. - MAMMARY CARCINOMA IN SITU WITH CALCIFICATIONS. - SEE COMMENT. Microscopic Comment 1. The carcinoma is grade I and has some lobular features. 2. The carcinoma on part 2 is morphologically identical to that in part 1. 3. The carcinoma appears grade II and is likely a ductal phenotype. Breast prognostic profile will be performed on parts 1 and 3 and the results reported separately. The results were called to [facility] on 01/27/14.  Estrogen Receptor: 100%, POSITIVE, STRONG STAINING INTENSITY Progesterone Receptor: 100%, POSITIVE, STRONG STAINING INTENSITY Proliferation Marker Ki67: 29%  Results: HER-2/NEU BY CISH - NO AMPLIFICATION OF HER-2 DETECTED. RESULT RATIO OF HER2: CEP 17 SIGNALS 0.94 AVERAGE HER2 COPY NUMBER PER CELL 1.65   RADIOGRAPHIC STUDIES: I have personally reviewed the radiological images as listed and agreed with the findings in the report.  Mm Digital Diagnostic Bilat 04/19/2014  1. Left breast 2 o'clock 4 cm from the nipple measuring 0.9 x 0.8 x 0.6 cm in size (previously 0.9 x 0.8 x 0.6 cm) 2. Left breast  6:30 o'clock 3 cm from the nipple measuring 0.7 x 0.7 x 0.9 cm in size (previously 0.9 x 0.5 x 0.8 cm in size). 3.  Left breast subareolar measuring 0.6 x 0.5 x 0.4 cm in size (previously 0.5 x 0.5 x 0.6 cm size).  IMPRESSION: 3 known malignancies within the left breast without significant interval change by mammography or ultrasound.  MM and [place] left breast 09/19/2014  Ultrasound targeted to the known malignancy at the 2 o'clock location in the left breast measures 5 x 4 x 5 mm, previously 9 x 8 x 6 mm. The biopsy proven malignancy at 6:30 measures 7 x 6 x 5 mm, previously 8 x 7 x 5 mm. The biopsy marking clip in the subareolar left breast is seen, without definite associated residual mass.  IMPRESSION: 1. Interval decrease in the size and density of the three previously biopsied sites of malignancy in the left breast.  2. Note is made of suspicious pleomorphic calcifications which have not been previously biopsied. These extend approximately 6 cm superior and posterior to the biopsy marking clip at the 2 o'clock location. This area appears stable.  RECOMMENDATION: Recommend continued treatment plan.    ASSESSMENT & PLAN:  82 year old female with past medical history of stage I endometrial cancer, status post hysterectomy, anxiety and insomnia, who was recently diagnosed with left breast cancer.  1. Left breast cancer, 3 synchronized lesions, 2 of them with lobular features and one has ductal features. T1b-1cN0M0, stage I, ER+/PR+/HER2- -I reviewed her breast mass biopsy findings and image findings with patient and her daughter extensively. She has 3 synchronized early stage breast cancer without clinical lymph nodes involvement. I discussed that surgical resection, likely mastectomy due to the location of this 3 masses, would be the only curative treatment for her breast cancer. However she is very hesitated to have surgery up front due to the hospitalization and potential  complications.  I discussed that the surgery again with her today, she is to declines. -Giving her lobular features, strong ER/PR positivity, I recommend endocrine therapy, i.e. aromatase inhibitor,  She has been tolerating anastrozole very well, we'll continue indefinitely if no surgery.  -Her repeated mammogram and ultrasound showed partial response after 7 month of treatment. I reviewed the images results with patient and her family members. - we'll repeat diagnostic mammogram bilateral, and left breast and axillary ultrasound before her next visit -Continue anastrozole.  2. Genetics -She has personal history of endometrial cancer, family history of breast cancer and pancreatic cancer. I discussed the genetic screening for inherited breast cancer and lynch syndrome, she declined at this point.  She will continue to follow-up with her primary care physician for other medical issues.  Plan - continue Arimidex, I refilled for her  -RTC in 3 month with lab and  Bilateral diagnostic mammogram, left-sided breast and axilla ultrasound one week before  Truitt Merle  05/07/2015

## 2015-05-08 ENCOUNTER — Telehealth: Payer: Self-pay | Admitting: Hematology

## 2015-05-08 NOTE — Telephone Encounter (Signed)
cld pt and left message of time & date of mamma appt/location 6/7'@1'$  @ [facility]

## 2015-07-18 ENCOUNTER — Other Ambulatory Visit: Payer: Medicare Other

## 2015-08-03 ENCOUNTER — Encounter (HOSPITAL_COMMUNITY): Payer: Self-pay | Admitting: Emergency Medicine

## 2015-08-03 ENCOUNTER — Ambulatory Visit (HOSPITAL_COMMUNITY)
Admission: EM | Admit: 2015-08-03 | Discharge: 2015-08-03 | Disposition: A | Discharge status: Home / Self Care | Payer: Medicare Other | Attending: Family Medicine | Admitting: Family Medicine

## 2015-08-03 DIAGNOSIS — R0781 Pleurodynia: Secondary | ICD-10-CM

## 2015-08-03 DIAGNOSIS — R0789 Other chest pain: Secondary | ICD-10-CM

## 2015-08-03 NOTE — Discharge Instructions (Signed)
Chest Wall Pain Continue to apply heat periodically. May take Aleve as needed for pain. Limit those actions and activities that produce the pain. This should continue to get better over the next several days or couple weeks. Chest wall pain is pain in or around the bones and muscles of your chest. Sometimes, an injury causes this pain. Sometimes, the cause may not be known. This pain may take several weeks or longer to get better. HOME CARE Pay attention to any changes in your symptoms. Take these actions to help with your pain:  Rest as told by your doctor.  Avoid activities that cause pain. Try not to use your chest, belly (abdominal), or side muscles to lift heavy things.  If directed, apply ice to the painful area:  Put ice in a plastic bag.  Place a towel between your skin and the bag.  Leave the ice on for 20 minutes, 2-3 times per day.  Take over-the-counter and prescription medicines only as told by your doctor.  Do not use tobacco products, including cigarettes, chewing tobacco, and e-cigarettes. If you need help quitting, ask your doctor.  Keep all follow-up visits as told by your doctor. This is important. GET HELP IF:  You have a fever.  Your chest pain gets worse.  You have new symptoms. GET HELP RIGHT AWAY IF:  You feel sick to your stomach (nauseous) or you throw up (vomit).  You feel sweaty or light-headed.  You have a cough with phlegm (sputum) or you cough up blood.  You are short of breath.   This information is not intended to replace advice given to you by your health care provider. Make sure you discuss any questions you have with your health care provider.   Document Released: 07/16/2007 Document Revised: 10/18/2014 Document Reviewed: 04/24/2014 Elsevier Interactive Patient Education [employer].

## 2015-08-03 NOTE — ED Provider Notes (Signed)
CSN: [id number]     Arrival date & time 08/03/15  1257 History   First MD Initiated Contact with Patient 08/03/15 1311     Chief Complaint  Patient presents with  . Flank Pain   (Consider location/radiation/quality/duration/timing/severity/associated sxs/prior Treatment) HPI Comments: 82-year-old female is complaining of soreness to the right flank. The pain is elicited by stretching her right arm out and leaning forward to pick something up. The pain only occurs when she makes specific movements as described above. When resting and sitting in an upright position and another comfortable positions she does not have pain. She has no shortness of breath or cough. She describes the pain is sharp. It does not radiate and it is well localized to the right lateral chest along the posterior axillary line. She cannot recall any known injury, blunt trauma, fall or other reason to have the pain.  Patient is a 82 y.o. female presenting with flank pain.  Flank Pain Pertinent negatives include no shortness of breath.    Past Medical History  Diagnosis Date  . ANXIETY   . HYPERLIPIDEMIA   . INSOMNIA-SLEEP DISORDER-UNSPEC   . WEIGHT LOSS   . Endometrial cancer ([place]) 2007  . Hyperglycemia 04/20/2014   Past Surgical History  Procedure Laterality Date  . Abdominal hysterectomy  2007  . Oophorectomy  2007  . Thyroidectomy, partial  1983   Family History  Problem Relation Age of Onset  . Diabetes Mother   . Muscular dystrophy Father   . Cancer Sister 71    pancreatic cancer   . Cancer Other 40    breast cancer    Social History  Substance Use Topics  . Smoking status: Former Smoker -- 50 years    Types: Cigarettes    Quit date: 02/10/1994  . Smokeless tobacco: Never Used  . Alcohol Use: 4.2 oz/week    7 Glasses of wine per week     Comment: 2 0z. HS to help her sleep   OB History    No data available     Review of Systems  Constitutional: Negative.   HENT: Negative.   Respiratory:  Negative for cough, chest tightness, shortness of breath, wheezing and stridor.   Cardiovascular: Negative for palpitations and leg swelling.  Gastrointestinal: Negative.   Genitourinary: Positive for flank pain.  Musculoskeletal: Positive for back pain. Negative for joint swelling, neck pain and neck stiffness.  Neurological: Negative.   All other systems reviewed and are negative.   Allergies  Synthroid and Pneumovax  Home Medications   Prior to Admission medications   Medication Sig Start Date End Date Taking? Authorizing Provider  ALPHA-LIPOIC ACID 100 MG TABS Take by mouth daily.    Historical Provider, MD  ALPRAZolam Duanne Moron) 0.5 MG tablet Take 1 tablet (0.5 mg total) by mouth 3 (three) times daily as needed. 04/13/15   Golden Circle, FNP  anastrozole (ARIMIDEX) 1 MG tablet Take 1 tablet (1 mg total) by mouth daily. 05/07/15   Truitt Merle, MD  CALCIUM-MAGNESUIUM-ZINC [phone] MG TABS Take by mouth daily.    Historical Provider, MD  cetirizine (ZYRTEC) 10 MG tablet Take 1 tablet (10 mg total) by mouth daily. 04/20/14   Biagio Borg, MD  cholecalciferol (VITAMIN D) 1000 UNITS tablet Take 1,000 Units by mouth daily.    Historical Provider, MD  co-enzyme Q-10 30 MG capsule Take 100 mg by mouth daily.     Historical Provider, MD  diphenhydramine-acetaminophen (TYLENOL PM) 25-500 MG TABS Take 1 tablet  by mouth at bedtime as needed.    Historical Provider, MD  fluticasone (FLONASE) 50 MCG/ACT nasal spray Place 2 sprays into both nostrils daily. 04/20/14   Biagio Borg, MD  Multiple Vitamins-Minerals (MULTIVITAMIN WITH MINERALS) tablet Take 1 tablet by mouth daily. Forward Gold    Historical Provider, MD  Omega-3 Fatty Acids (FISH OIL) 1000 MG CPDR Take by mouth daily.    Historical Provider, MD  OVER THE COUNTER MEDICATION Take by mouth at bedtime as needed. zzquil for sleep    Historical Provider, MD   Meds Ordered and Administered this Visit  Medications - No data to display  BP 149/50  mmHg  Pulse 68  Temp(Src) 97.7 F (36.5 C) (Oral)  Resp 18  SpO2 100% No data found.   Physical Exam  Constitutional: She is oriented to person, place, and time. She appears well-developed and well-nourished. No distress.  HENT:  Head: Normocephalic and atraumatic.  Eyes: EOM are normal.  Neck: Normal range of motion. Neck supple.  Cardiovascular: Normal rate.   Pulmonary/Chest: Effort normal and breath sounds normal. No respiratory distress. She has no wheezes. She has no rales. She exhibits tenderness.  Tenderness to the anterior posterior ribs primarily along the posterior axillary line. The specific areas difficult to locate with deep palpation. It is best located after she leans forward and stretches her arm to produce the pain. No overlying discoloration, swelling or deformities. The pain is directly over the lower lateral ribs and intercostal muscles.  Musculoskeletal: Normal range of motion. She exhibits tenderness. She exhibits no edema.  Neurological: She is alert and oriented to person, place, and time. No cranial nerve deficit.  Skin: Skin is warm and dry.  Nursing note and vitals reviewed.   ED Course  Procedures (including critical care time)  Labs Review Labs Reviewed - No data to display  Imaging Review No results found.   Visual Acuity Review  Right Eye Distance:   Left Eye Distance:   Bilateral Distance:    Right Eye Near:   Left Eye Near:    Bilateral Near:         MDM   1. Rib pain on right side   2. Chest wall pain    Continue to apply heat periodically. May take Aleve as needed for pain. Limit those actions and activities that produce the pain. This should continue to get better over the next several days or couple weeks.     Janne Napoleon, NP 08/03/15 1331

## 2015-08-03 NOTE — ED Notes (Signed)
Patient has pain in right flank area for 3 weeks.  Pain worsens with extreme bending at waist

## 2015-08-07 ENCOUNTER — Ambulatory Visit: Payer: Medicare Other | Admitting: Family Medicine

## 2015-08-07 ENCOUNTER — Other Ambulatory Visit: Payer: Medicare Other

## 2015-08-07 ENCOUNTER — Encounter: Payer: Medicare Other | Admitting: Hematology

## 2015-08-07 ENCOUNTER — Encounter: Payer: Self-pay | Admitting: Hematology

## 2015-08-07 NOTE — Progress Notes (Signed)
This encounter was created in error - please disregard.

## 2015-08-08 ENCOUNTER — Ambulatory Visit
Admission: RE | Admit: 2015-08-08 | Discharge: 2015-08-08 | Disposition: A | Discharge status: Home / Self Care | Payer: Medicare Other | Source: Ambulatory Visit | Attending: Hematology | Admitting: Hematology

## 2015-08-08 ENCOUNTER — Telehealth: Payer: Self-pay | Admitting: Hematology

## 2015-08-08 DIAGNOSIS — C50912 Malignant neoplasm of unspecified site of left female breast: Secondary | ICD-10-CM

## 2015-08-08 NOTE — Telephone Encounter (Signed)
S.w. Pt and Advised on July appt....the patient ok and aware

## 2015-08-15 ENCOUNTER — Ambulatory Visit (INDEPENDENT_AMBULATORY_CARE_PROVIDER_SITE_OTHER): Payer: Medicare Other | Admitting: Sports Medicine

## 2015-08-15 ENCOUNTER — Encounter: Payer: Self-pay | Admitting: Sports Medicine

## 2015-08-15 VITALS — Ht 66.0 in | Wt 190.0 lb

## 2015-08-15 DIAGNOSIS — M1712 Unilateral primary osteoarthritis, left knee: Secondary | ICD-10-CM

## 2015-08-15 DIAGNOSIS — M25462 Effusion, left knee: Secondary | ICD-10-CM

## 2015-08-15 MED ORDER — METHYLPREDNISOLONE ACETATE 40 MG/ML IJ SUSP
40.0000 mg | Freq: Once | INTRAMUSCULAR | Status: AC
  Administered 2015-08-15: 40 mg via INTRA_ARTICULAR

## 2015-08-16 NOTE — Progress Notes (Signed)
   Subjective:    Patient ID: Doris Zamora, female    DOB: [birth year], 82 y.o.   MRN: [id number]  HPI chief complaint: Left knee pain  Very pleasant 82 year old female comes in today requesting a repeat cortisone injection into her left knee. She has a well-documented history of left knee DJD. She was last seen in the office in 2016. Cortisone injection administered at that time helped her tremendously up until a few weeks ago. Pain has returned. It is identical in nature to what she has experienced previously. Pain is primarily along the medial aspect of the knee. Intermittent swelling. She is walking with a cane. She is here today with her daughter.  Past medical history reviewed Medications reviewed Allergies reviewed    Review of Systems    as above Objective:   Physical Exam  Well-developed, well-nourished. No acute distress. Vital signs reviewed  Left knee: Patient maintains good range of motion. Mild to moderate varus deformity. She is tender to palpation along the medial joint line. Trace effusion. Knee is grossly stable ligamentous exam. Neurovascularly intact distally. Walking with the assistance of a cane.      Assessment & Plan:   Returning left knee pain secondary to DJD  Left knee is injected today. An anterior lateral approach is utilized. Patient tolerates this without difficulty. Follow-up as needed.  Consent obtained and verified. Time-out conducted. Noted no overlying erythema, induration, or other signs of local infection. Skin prepped in a sterile fashion. Topical analgesic spray: Ethyl chloride. Joint: left knee Needle: 22g 1.5 inch Completed without difficulty. Meds: 3 cc 1% xylocaine, 1cc ('40mg'$ ) depomedrol  Advised to call if fevers/chills, erythema, induration, drainage, or persistent bleeding.

## 2015-08-23 ENCOUNTER — Telehealth: Payer: Self-pay | Admitting: Hematology

## 2015-08-23 ENCOUNTER — Other Ambulatory Visit (HOSPITAL_BASED_OUTPATIENT_CLINIC_OR_DEPARTMENT_OTHER): Payer: Medicare Other

## 2015-08-23 ENCOUNTER — Encounter: Payer: Self-pay | Admitting: Hematology

## 2015-08-23 ENCOUNTER — Ambulatory Visit (HOSPITAL_BASED_OUTPATIENT_CLINIC_OR_DEPARTMENT_OTHER): Payer: Medicare Other | Admitting: Hematology

## 2015-08-23 VITALS — BP 147/51 | HR 67 | Temp 97.7°F | Resp 18 | Ht 66.0 in | Wt 189.8 lb

## 2015-08-23 DIAGNOSIS — C50112 Malignant neoplasm of central portion of left female breast: Secondary | ICD-10-CM

## 2015-08-23 DIAGNOSIS — C50912 Malignant neoplasm of unspecified site of left female breast: Secondary | ICD-10-CM

## 2015-08-23 DIAGNOSIS — C50412 Malignant neoplasm of upper-outer quadrant of left female breast: Secondary | ICD-10-CM

## 2015-08-23 DIAGNOSIS — Z8542 Personal history of malignant neoplasm of other parts of uterus: Secondary | ICD-10-CM

## 2015-08-23 DIAGNOSIS — C50812 Malignant neoplasm of overlapping sites of left female breast: Secondary | ICD-10-CM

## 2015-08-23 DIAGNOSIS — Z79811 Long term (current) use of aromatase inhibitors: Secondary | ICD-10-CM | POA: Diagnosis not present

## 2015-08-23 DIAGNOSIS — Z803 Family history of malignant neoplasm of breast: Secondary | ICD-10-CM | POA: Diagnosis not present

## 2015-08-23 DIAGNOSIS — M1990 Unspecified osteoarthritis, unspecified site: Secondary | ICD-10-CM | POA: Diagnosis not present

## 2015-08-23 DIAGNOSIS — Z800 Family history of malignant neoplasm of digestive organs: Secondary | ICD-10-CM | POA: Diagnosis not present

## 2015-08-23 DIAGNOSIS — Z170 Estrogen receptor positive status [ER+]: Secondary | ICD-10-CM

## 2015-08-23 LAB — COMPREHENSIVE METABOLIC PANEL
ALBUMIN: 3.2 g/dL — AB (ref 3.5–5.0)
ALK PHOS: 99 U/L (ref 40–150)
ALT: 37 U/L (ref 0–55)
AST: 38 U/L — AB (ref 5–34)
Anion Gap: 8 mEq/L (ref 3–11)
BUN: 13.4 mg/dL (ref 7.0–26.0)
CO2: 29 mEq/L (ref 22–29)
CREATININE: 0.8 mg/dL (ref 0.6–1.1)
Calcium: 9.5 mg/dL (ref 8.4–10.4)
Chloride: 103 mEq/L (ref 98–109)
EGFR: 77 mL/min/{1.73_m2} — ABNORMAL LOW (ref >90)
GLUCOSE: 97 mg/dL (ref 70–140)
POTASSIUM: 4 meq/L (ref 3.5–5.1)
SODIUM: 140 meq/L (ref 136–145)
TOTAL PROTEIN: 7.9 g/dL (ref 6.4–8.3)
Total Bilirubin: 0.48 mg/dL (ref 0.20–1.20)

## 2015-08-23 LAB — CBC WITH DIFFERENTIAL/PLATELET
BASO%: 0.5 % (ref 0.0–2.0)
Basophils Absolute: 0 10*3/uL (ref 0.0–0.1)
EOS%: 1.3 % (ref 0.0–7.0)
Eosinophils Absolute: 0.1 10*3/uL (ref 0.0–0.5)
HCT: 40.6 % (ref 34.8–46.6)
HEMOGLOBIN: 13.5 g/dL (ref 11.6–15.9)
LYMPH#: 2.2 10*3/uL (ref 0.9–3.3)
LYMPH%: 35.6 % (ref 14.0–49.7)
MCH: 30.8 pg (ref 25.1–34.0)
MCHC: 33.3 g/dL (ref 31.5–36.0)
MCV: 92.5 fL (ref 79.5–101.0)
MONO#: 0.7 10*3/uL (ref 0.1–0.9)
MONO%: 12.2 % (ref 0.0–14.0)
NEUT%: 50.4 % (ref 38.4–76.8)
NEUTROS ABS: 3.1 10*3/uL (ref 1.5–6.5)
Platelets: 211 10*3/uL (ref 145–400)
RBC: 4.39 10*6/uL (ref 3.70–5.45)
RDW: 13.6 % (ref 11.2–14.5)
WBC: 6.1 10*3/uL (ref 3.9–10.3)

## 2015-08-23 MED ORDER — ANASTROZOLE 1 MG PO TABS
1.0000 mg | ORAL_TABLET | Freq: Every day | ORAL | Status: DC
Start: 2015-08-23 — End: 2016-02-19

## 2015-08-23 NOTE — Progress Notes (Signed)
[facility]  [phone] [phone]  Clinic Follow Up Note   Patient Care Team: Biagio Borg, MD as PCP - General (Internal Medicine) Eulis Manly. Gershon Crane, MD as Attending Physician (Ophthalmology) Autumn Messing III, MD as Consulting Physician (General Surgery) Truitt Merle, MD as Consulting Physician (Hematology) 08/23/2015  CHIEF COMPLAINT Follow-up breast cancer  Oncology History   Breast cancer, left breast   Staging form: Breast, AJCC 7th Edition     Clinical: T1c, N0 - Unsigned Endometrial cancer   Staging form: Corpus Uteri - Adenosarcoma, AJCC 7th Edition     Clinical: T1c, N0 - Unsigned       Endometrial cancer ([place])   08/12/2005 Initial Diagnosis Endometrial cancer, T1bN0, s/p hysrectomy     Breast cancer, left breast ([place])   01/13/2014 Imaging mammogram and US showed 3 masses at 6 o'clock, retroareolar and 2 o'clock area, measuring 0.7-1.7cm.    01/26/2014 Initial Diagnosis Breast cancer, left breast, multifocal (3 lesions, biopsy showed 2 similar lesions with lobular features, and the third lesion has ductal features).    02/10/2014 -  Anti-estrogen oral therapy Pt declined surgery, started anastrozole 1 mg once daily.    09/19/2014 Mammogram  I asked her mammogram and ultrasound showed interval decrease in the size and density of the 3 previous biopsy sites of malignancy in the left breast.   CURRENT THERAPY: Anastrozole 1 mg once daily, started on 02/10/2014.  HISTORY OF INITIAL PRESENTING ILLNESS:  Doris Zamora 82 y.o. female is here because of left breast cancer.  She noticed left breast mass and nipple inversion about months ago. No tenderness or nipple discharge. She otherwise feels well. No change of appetite and weight. ROS (+) indigestion but otherwise negative.  She underwent mammogram and [place] which showed 3 masses at the 6:00, retroareola, and 2:00. The masses measures 1-1.7 cm by mammogram, 0.7-0.9 cm by ultrasound. She underwent  subsequent biopsy of this 3 lesions which showed invasive mammary carcinoma, 2 of them showed similar morphology and lobular features, one showed ductal features. Both are ER and PR positive.  She tolerated the biopsy well without complications. She feels well in general, denies any pain or new symptoms. No change in her appetite and weight recently. She was seen by breast surgeon Dr. Marlou Starks yesterday, who recommended mastectomy, and she declined upfront surgery.  INTERIM HISTORY: [name] returns for follow-up. She is accompanied by her daughter to my clinic today. She is doing well overall, she has also arthritis of her left knee, and he received a steroid injection a week ago. The knee pain has improved. She denies any other new pain, or other new symptoms. She is tolerating-a very well, no hot flash. She has good appetite and remains to be moderated active. She uses a cane when she goes out.  MEDICAL HISTORY:  Past Medical History  Diagnosis Date  . ANXIETY   . HYPERLIPIDEMIA   . INSOMNIA-SLEEP DISORDER-UNSPEC   . WEIGHT LOSS   . Endometrial cancer ([place]) 2007  . Hyperglycemia 04/20/2014    SURGICAL HISTORY: Past Surgical History  Procedure Laterality Date  . Abdominal hysterectomy  2007  . Oophorectomy  2007  . Thyroidectomy, partial  1983   GYN HISTORY  Menarchal: 14 LMP: age of 58 Contraceptive: 1 yr  HRT: no  G3P3:   SOCIAL HISTORY: History   Social History  . Marital Status: Widowed    Spouse Name: N/A    Number of Children: 3  . Years of Education:  N/A   Occupational History  . Not on file.   Social History Main Topics  . Smoking status: Former Smoker -- 50 years    Types: Cigarettes    Quit date: 02/10/1994  . Smokeless tobacco: Never Used  . Alcohol Use: 4.2 oz/week    7 Glasses of wine per week     Comment: 2 0z. HS to help her sleep  . Drug Use: No     Comment: admiited to poat use of marjuana to help her sleep  . Sexual Activity: Not on file   Other  Topics Concern  . 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 Diabetes Mother   . Muscular dystrophy Father   . Cancer Sister 7    pancreatic cancer   . Cancer Other 40    breast cancer     ALLERGIES:  is allergic to synthroid and pneumovax.  MEDICATIONS:    Medication List       This list is accurate as of: 08/23/15  2:35 PM.  Always use your most recent med list.               Alpha-Lipoic Acid 100 MG Tabs  Take by mouth daily.     ALPRAZolam 0.5 MG tablet  Commonly known as:  XANAX  Take 1 tablet (0.5 mg total) by mouth 3 (three) times daily as needed.     anastrozole 1 MG tablet  Commonly known as:  ARIMIDEX  Take 1 tablet (1 mg total) by mouth daily.     CALCIUM-MAGNESUIUM-ZINC 333-133-8.3 MG Tabs  Take by mouth daily.     cetirizine 10 MG tablet  Commonly known as:  ZYRTEC  Take 1 tablet (10 mg total) by mouth daily.     cholecalciferol 1000 units tablet  Commonly known as:  VITAMIN D  Take 1,000 Units by mouth daily.     co-enzyme Q-10 30 MG capsule  Take 100 mg by mouth daily.     Fish Oil 1000 MG Cpdr  Take by mouth daily.     fluticasone 50 MCG/ACT nasal spray  Commonly known as:  FLONASE  Place 2 sprays into both nostrils daily.     multivitamin with minerals tablet  Take 1 tablet by mouth daily. Forward Gold     OVER THE COUNTER MEDICATION  Take by mouth at bedtime as needed. zzquil for sleep       REVIEW OF SYSTEMS:   Constitutional: Denies fevers, chills or abnormal night sweats Eyes: Denies blurriness of vision, double vision or watery eyes Ears, nose, mouth, throat, and face: Denies mucositis or sore throat Respiratory: Denies cough, dyspnea or wheezes Cardiovascular: Denies palpitation, chest discomfort or lower extremity swelling Gastrointestinal:  Denies nausea, heartburn or change in bowel habits Skin:  Denies abnormal skin rashes Lymphatics: Denies new lymphadenopathy or easy bruising Neurological:Denies numbness, tingling or new weaknesses Behavioral/Psych: Mood is stable, no new changes  All other systems were reviewed with the patient and are negative.  PHYSICAL EXAMINATION: ECOG PERFORMANCE STATUS: 1  Filed Vitals:   08/23/15 1351 08/23/15 1352  BP: 160/45 147/51  Pulse: 67   Temp: 97.7 F (36.5 C)   Resp: 18    Filed Weights   08/23/15 1351  Weight: 189 lb 12.8 oz (86.093 kg)    GENERAL:alert, no  distress and comfortable SKIN: skin color, texture, turgor are normal, no rashes or significant lesions EYES: normal, conjunctiva are pink and non-injected, sclera clear OROPHARYNX:no exudate, no erythema and lips, buccal mucosa, and tongue normal  NECK: supple, thyroid normal size, non-tender, without nodularity LYMPH:  no palpable lymphadenopathy in the cervical, axillary or inguinal LUNGS: clear to auscultation and percussion with normal breathing effort HEART: regular rate & rhythm and no murmurs and no lower extremity edema ABDOMEN:abdomen soft, non-tender and normal bowel sounds Musculoskeletal:no cyanosis of digits and no clubbing  PSYCH: alert & oriented x 3 with fluent speech NEURO: no focal motor/sensory deficits Breasts: Breast inspection showed them to be symmetrical, (+) left nipple inversion. There is a 1.0 cm mass inferior to her left breast nipple, and two additional 20m nodules around it.  Palpation of the right breast and bilateral  axilla revealed no obvious mass that I could appreciate.  LABORATORY DATA:  I have reviewed the data as listed CBC Latest Ref Rng 08/23/2015 05/07/2015 01/01/2015  WBC 3.9 - 10.3 10e3/uL 6.1 5.4 6.0  Hemoglobin 11.6 - 15.9 g/dL 13.5 13.1 13.4  Hematocrit 34.8 - 46.6 % 40.6 40.7 41.5  Platelets 145 - 400 10e3/uL 211 220 226    CMP Latest Ref Rng 08/23/2015 05/07/2015 01/01/2015  Glucose 70 - 140 mg/dl 97 102 83  BUN 7.0 - 26.0  mg/dL 13.4 12.1 15.3  Creatinine 0.6 - 1.1 mg/dL 0.8 0.8 0.7  Sodium 136 - 145 mEq/L 140 141 139  Potassium 3.5 - 5.1 mEq/L 4.0 4.1 4.4  CO2 22 - 29 mEq/L 29 30(H) 27  Calcium 8.4 - 10.4 mg/dL 9.5 9.2 9.7  Total Protein 6.4 - 8.3 g/dL 7.9 7.7 8.0  Total Bilirubin 0.20 - 1.20 mg/dL 0.48 0.43 0.59  Alkaline Phos 40 - 150 U/L 99 68 92  AST 5 - 34 U/L 38(H) 30 62(H)  ALT 0 - 55 U/L 37 26 62(H)     PATHOLOGY REPORT Diagnosis 01/26/2014 1. Breast, left, needle core biopsy, mass, lower inner, 6:30 o'clock - INVASIVE MAMMARY CARCINOMA. - SEE COMMENT. 2. Breast, left, needle core biopsy, mass, retroareolar - INVASIVE MAMMARY CARCINOMA. - MAMMARY CARCINOMA IN SITU. - SEE COMMENT. 3. Breast, left, needle core biopsy, mass, 2:30 o'clock - INVASIVE MAMMARY CARCINOMA. - MAMMARY CARCINOMA IN SITU WITH CALCIFICATIONS. - SEE COMMENT. Microscopic Comment 1. The carcinoma is grade I and has some lobular features. 2. The carcinoma on part 2 is morphologically identical to that in part 1. 3. The carcinoma appears grade II and is likely a ductal phenotype. Breast prognostic profile will be performed on parts 1 and 3 and the results reported separately. The results were called to [facility] 01/27/14.  Estrogen Receptor: 100%, POSITIVE, STRONG STAINING INTENSITY Progesterone Receptor: 100%, POSITIVE, STRONG STAINING INTENSITY Proliferation Marker Ki67: 29%  Results: HER-2/NEU BY CISH - NO AMPLIFICATION OF HER-2 DETECTED. RESULT RATIO OF HER2: CEP 17 SIGNALS 0.94 AVERAGE HER2 COPY NUMBER PER CELL 1.65   RADIOGRAPHIC STUDIES: I have personally reviewed the radiological images as listed and agreed with the findings in the report.  Mm Digital Diagnostic Bilat 04/19/2014  1. Left breast 2 o'clock 4 cm from the nipple measuring 0.9 x 0.8 x 0.6 cm in size (previously 0.9 x 0.8 x 0.6 cm) 2. Left breast 6:30 o'clock 3 cm from the nipple measuring 0.7 x 0.7 x 0.9 cm in size  (previously 0.9 x 0.5 x 0.8 cm in size). 3. Left breast subareolar  measuring 0.6 x 0.5 x 0.4 cm in size (previously 0.5 x 0.5 x 0.6 cm size).  IMPRESSION: 3 known malignancies within the left breast without significant interval change by mammography or ultrasound.  MM and [place] left breast 09/19/2014  Ultrasound targeted to the known malignancy at the 2 o'clock location in the left breast measures 5 x 4 x 5 mm, previously 9 x 8 x 6 mm. The biopsy proven malignancy at 6:30 measures 7 x 6 x 5 mm, previously 8 x 7 x 5 mm. The biopsy marking clip in the subareolar left breast is seen, without definite associated residual mass.  IMPRESSION: 1. Interval decrease in the size and density of the three previously biopsied sites of malignancy in the left breast.  2. Note is made of suspicious pleomorphic calcifications which have not been previously biopsied. These extend approximately 6 cm superior and posterior to the biopsy marking clip at the 2 o'clock location. This area appears stable.  RECOMMENDATION: Recommend continued treatment plan.  FINDINGS: There are no suspicious masses, areas of architectural distortion or microcalcifications in the right breast.  Again seen are spiculated low-density masses in the left 6:30 o'clock, middle depth and subareolar breast, anterior depth. These appear stable in size but decreased in density when compared to the prior mammogram. Post biopsy clip markers are identified within both masses. There is a third mass within the left 2 o'clock breast, middle depth, which mammographically demonstrates decrease in size. Coil shaped marker is seen within this mass. Additionally, there is a group of linear suspicious pleomorphic microcalcifications in the left breast upper outer quadrant, middle depth, spanning 7 cm in anterior to posterior dimension, which are not significantly changed from the prior mammogram.  Mammographic images were processed  with CAD.  On physical exam, there is left subareolar and 6 o'clock breast thickening.  Targeted ultrasound is performed, showing left breast 2 o'clock 4 cm from the nipple 0.5 by 0.4 by 0.4 cm hypoechoic spiculated clip containing mass, stable in size considering differences in measurement technique. In the left breast 6:30 o'clock 3 cm from the nipple there is a hypoechoic irregular mass which measures 0.9 by 0.6 by 0.8 cm, also stable in size. A linear extension from this mass is noted toward the nipple, suspicious for involvement with invasive or ductal carcinoma in situ. The previously identified subareolar breast mass is not identified on today's exam.  Bilateral diagnostic mammogram and left breast and axilla ultrasound6/28/2017 IMPRESSION: Slight decrease in size of left breast 2 o'clock biopsy proven carcinoma.  Relatively stable in size left breast 6:30 o'clock carcinoma. Sonographic examination suggests segmental disease extension towards the nipple.  Previously identified left subareolar breast mass is not seen sonographically, and demonstrates stable size but decreased density mammographically.  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  No evidence of left axillary lymphadenopathy.  ASSESSMENT & PLAN:  82 year old female with past medical history of stage I endometrial cancer, status post hysterectomy, anxiety and insomnia, who was recently diagnosed with left breast cancer.  1. Left breast cancer, 3 synchronized lesions, 2 of them with lobular features and one has ductal features. T1b-1cN0M0, stage I, ER+/PR+/HER2- -I previously reviewed her breast mass biopsy findings and image findings with patient and her daughter extensively. She has 3 synchronized early stage breast cancer without clinical lymph nodes involvement. I discussed that  surgical resection, likely mastectomy due to the location of  this 3 masses, would be the only curative treatment for her breast cancer. However she is very hesitated to have surgery up front due to the hospitalization and potential complications.  I discussed that the surgery again with her today, she still declines surgery.  - She has been tolerating anastrozole very well, we'll continue indefinitely if no surgery.  -Her repeated mammogram and ultrasound showed partial response after 1.5 year of treatment. I reviewed the images results with patient and her family members. -She is clinically doing well, no adenopathy on physical exam, no clinical signs or concerns for metastatic disease. -Continue anastrozole.  2. Genetics -She has personal history of endometrial cancer, family history of breast cancer and pancreatic cancer. I discussed the genetic screening for inherited breast cancer and lynch syndrome, she declined at this point.  3. Arthritis  -She'll continue follow-up with her primary care physician and orthopedic surgeon  She will continue to follow-up with her primary care physician for other medical issues.  Plan - continue Arimidex, I refilled for her  -RTC in 3 month with lab   Truitt Merle  08/23/2015

## 2015-08-23 NOTE — Telephone Encounter (Signed)
Gave patient avs report and appointments for October  °

## 2015-10-16 ENCOUNTER — Other Ambulatory Visit: Payer: Self-pay | Admitting: Family

## 2015-10-17 NOTE — Telephone Encounter (Signed)
faxed

## 2015-10-17 NOTE — Telephone Encounter (Signed)
Done hardcopy to Corinne  

## 2015-11-22 ENCOUNTER — Telehealth: Payer: Self-pay | Admitting: Hematology

## 2015-11-22 ENCOUNTER — Other Ambulatory Visit (HOSPITAL_BASED_OUTPATIENT_CLINIC_OR_DEPARTMENT_OTHER): Payer: Medicare Other

## 2015-11-22 ENCOUNTER — Ambulatory Visit (HOSPITAL_BASED_OUTPATIENT_CLINIC_OR_DEPARTMENT_OTHER): Payer: Medicare Other | Admitting: Hematology

## 2015-11-22 VITALS — BP 139/42 | HR 68 | Temp 98.3°F | Resp 18 | Ht 66.0 in | Wt 184.6 lb

## 2015-11-22 DIAGNOSIS — C50812 Malignant neoplasm of overlapping sites of left female breast: Secondary | ICD-10-CM

## 2015-11-22 DIAGNOSIS — C50912 Malignant neoplasm of unspecified site of left female breast: Secondary | ICD-10-CM

## 2015-11-22 DIAGNOSIS — M1990 Unspecified osteoarthritis, unspecified site: Secondary | ICD-10-CM

## 2015-11-22 DIAGNOSIS — Z170 Estrogen receptor positive status [ER+]: Secondary | ICD-10-CM

## 2015-11-22 DIAGNOSIS — Z79811 Long term (current) use of aromatase inhibitors: Secondary | ICD-10-CM

## 2015-11-22 LAB — CBC WITH DIFFERENTIAL/PLATELET
BASO%: 0.8 % (ref 0.0–2.0)
Basophils Absolute: 0 10*3/uL (ref 0.0–0.1)
EOS%: 1.7 % (ref 0.0–7.0)
Eosinophils Absolute: 0.1 10*3/uL (ref 0.0–0.5)
HCT: 40.6 % (ref 34.8–46.6)
HGB: 13.3 g/dL (ref 11.6–15.9)
LYMPH%: 40.5 % (ref 14.0–49.7)
MCH: 30.7 pg (ref 25.1–34.0)
MCHC: 32.8 g/dL (ref 31.5–36.0)
MCV: 93.8 fL (ref 79.5–101.0)
MONO#: 0.5 10*3/uL (ref 0.1–0.9)
MONO%: 10.5 % (ref 0.0–14.0)
NEUT#: 2.3 10*3/uL (ref 1.5–6.5)
NEUT%: 46.5 % (ref 38.4–76.8)
PLATELETS: 197 10*3/uL (ref 145–400)
RBC: 4.33 10*6/uL (ref 3.70–5.45)
RDW: 14 % (ref 11.2–14.5)
WBC: 4.8 10*3/uL (ref 3.9–10.3)
lymph#: 2 10*3/uL (ref 0.9–3.3)

## 2015-11-22 LAB — COMPREHENSIVE METABOLIC PANEL
ALT: 28 U/L (ref 0–55)
ANION GAP: 6 meq/L (ref 3–11)
AST: 34 U/L (ref 5–34)
Albumin: 3.3 g/dL — ABNORMAL LOW (ref 3.5–5.0)
Alkaline Phosphatase: 88 U/L (ref 40–150)
BUN: 10.6 mg/dL (ref 7.0–26.0)
CHLORIDE: 105 meq/L (ref 98–109)
CO2: 30 meq/L — AB (ref 22–29)
Calcium: 9.4 mg/dL (ref 8.4–10.4)
Creatinine: 0.8 mg/dL (ref 0.6–1.1)
EGFR: 82 mL/min/{1.73_m2} — AB (ref >90)
GLUCOSE: 85 mg/dL (ref 70–140)
Potassium: 4.5 mEq/L (ref 3.5–5.1)
SODIUM: 141 meq/L (ref 136–145)
Total Bilirubin: 0.52 mg/dL (ref 0.20–1.20)
Total Protein: 7.8 g/dL (ref 6.4–8.3)

## 2015-11-22 NOTE — Telephone Encounter (Signed)
Gv pt appt for 02/19/16. Pt will call the [facility] to schedule mammo and U/S.

## 2015-11-22 NOTE — Progress Notes (Signed)
Doris Zamora  [phone] [phone]  Clinic Follow Up Note   Patient Care Team: Biagio Borg, MD as PCP - General (Internal Medicine) Eulis Manly. Gershon Crane, MD as Attending Physician (Ophthalmology) Autumn Messing III, MD as Consulting Physician (General Surgery) Truitt Merle, MD as Consulting Physician (Hematology) 11/22/2015  CHIEF COMPLAINT Follow-up breast cancer  Oncology History   Breast cancer, left breast   Staging form: Breast, AJCC 7th Edition     Clinical: T1c, N0 - Unsigned Endometrial cancer   Staging form: Corpus Uteri - Adenosarcoma, AJCC 7th Edition     Clinical: T1c, N0 - Unsigned       Endometrial cancer ([place])   08/12/2005 Initial Diagnosis    Endometrial cancer, T1bN0, s/p hysrectomy        Breast cancer, left breast ([place])   01/13/2014 Imaging    mammogram and US showed 3 masses at 6 o'clock, retroareolar and 2 o'clock area, measuring 0.7-1.7cm.       01/26/2014 Initial Diagnosis    Breast cancer, left breast, multifocal (3 lesions, biopsy showed 2 similar lesions with lobular features, and the third lesion has ductal features).       02/10/2014 -  Anti-estrogen oral therapy    Pt declined surgery, started anastrozole 1 mg once daily.       09/19/2014 Mammogram     I asked her mammogram and ultrasound showed interval decrease in the size and density of the 3 previous biopsy sites of malignancy in the left breast.      CURRENT THERAPY: Anastrozole 1 mg once daily, started on 02/10/2014.  HISTORY OF INITIAL PRESENTING ILLNESS (02/08/2014):  Doris Zamora 82 y.o. female is here because of left breast cancer.  She noticed left breast mass and nipple inversion about months ago. No tenderness or nipple discharge. She otherwise feels well. No change of appetite and weight. ROS (+) indigestion but otherwise negative.  She underwent mammogram and [place] which showed 3 masses at the 6:00, retroareola, and 2:00. The masses measures 1-1.7 cm by  mammogram, 0.7-0.9 cm by ultrasound. She underwent subsequent biopsy of this 3 lesions which showed invasive mammary carcinoma, 2 of them showed similar morphology and lobular features, one showed ductal features. Both are ER and PR positive.  She tolerated the biopsy well without complications. She feels well in general, denies any pain or new symptoms. No change in her appetite and weight recently. She was seen by breast surgeon Dr. Marlou Starks yesterday, who recommended mastectomy, and she declined upfront surgery.  INTERIM HISTORY: Doris Zamora returns for follow-up. She is accompanied by her daughter to my clinic today. She is doing well overall. She denies any significant pain or other new complaints. She has intentionally lost some weight, about 6 pounds in the past 6 months. Her appetite and energy level remains fairly well.  MEDICAL HISTORY:  Past Medical History:  Diagnosis Date  . ANXIETY   . Endometrial cancer ([place]) 2007  . Hyperglycemia 04/20/2014  . HYPERLIPIDEMIA   . INSOMNIA-SLEEP DISORDER-UNSPEC   . WEIGHT LOSS     SURGICAL HISTORY: Past Surgical History:  Procedure Laterality Date  . ABDOMINAL HYSTERECTOMY  2007  . OOPHORECTOMY  2007  . THYROIDECTOMY, PARTIAL  1983   GYN HISTORY  Menarchal: 14 LMP: age of 7 Contraceptive: 1 yr  HRT: no  G3P3:   SOCIAL HISTORY: History   Social History  . Marital Status: Widowed    Spouse Name: N/A    Number of Children: 3  .  Years of Education: N/A   Occupational History  . Not on file.   Social History Main Topics  . Smoking status: Former Smoker -- 50 years    Types: Cigarettes    Quit date: 02/10/1994  . Smokeless tobacco: Never Used  . Alcohol Use: 4.2 oz/week    7 Glasses of wine per week     Comment: 2 0z. HS to help her sleep  . Drug Use: No     Comment: admiited to poat use of marjuana to help her sleep  . Sexual Activity: Not on file   Other Topics Concern  . Not on file   Social History Narrative          Widow. 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 Diabetes Mother   . Muscular dystrophy Father   . Cancer Sister 76    pancreatic cancer   . Cancer Other 40    breast cancer     ALLERGIES:  is allergic to synthroid [levothyroxine sodium] and pneumovax [pneumococcal polysaccharide vaccine].  MEDICATIONS:    Medication List       Accurate as of 11/22/15 10:48 AM. Always use your most recent med list.          Alpha-Lipoic Acid 100 MG Tabs Take by mouth daily.   ALPRAZolam 0.5 MG tablet Commonly known as:  XANAX TAKE ONE TABLET BY MOUTH THREE TIMES DAILY AS NEEDED   anastrozole 1 MG tablet Commonly known as:  ARIMIDEX Take 1 tablet (1 mg total) by mouth daily.   CALCIUM-MAGNESUIUM-ZINC 333-133-8.3 MG Tabs Take by mouth daily.   cetirizine 10 MG tablet Commonly known as:  ZYRTEC Take 1 tablet (10 mg total) by mouth daily.   cholecalciferol 1000 units tablet Commonly known as:  VITAMIN D Take 1,000 Units by mouth daily.   co-enzyme Q-10 30 MG capsule Take 100 mg by mouth daily.   Fish Oil 1000 MG Cpdr Take by mouth daily.   fluticasone 50 MCG/ACT nasal spray Commonly known as:  FLONASE Place 2 sprays into both nostrils daily.   multivitamin with minerals tablet Take 1 tablet by mouth daily. Forward Gold   OVER THE COUNTER MEDICATION Take by mouth at bedtime as needed. zzquil for sleep      REVIEW OF SYSTEMS:   Constitutional: Denies fevers, chills or abnormal night sweats Eyes: Denies blurriness of vision, double vision or watery eyes Ears, nose, mouth, throat, and face: Denies mucositis or sore throat Respiratory: Denies cough, dyspnea or wheezes Cardiovascular: Denies palpitation, chest discomfort or lower extremity swelling Gastrointestinal:  Denies nausea, heartburn or change in bowel habits Skin: Denies abnormal skin rashes Lymphatics: Denies new lymphadenopathy or easy  bruising Neurological:Denies numbness, tingling or new weaknesses Behavioral/Psych: Mood is stable, no new changes  All other systems were reviewed with the patient and are negative.  PHYSICAL EXAMINATION: ECOG PERFORMANCE STATUS: 1  Vitals:   11/22/15 0950 11/22/15 0951  BP: (!) 138/44 (!) 139/42  Pulse: 68   Resp: 18   Temp: 98.3 F (36.8 C)    Filed Weights   11/22/15 0950  Weight: 184 lb 9.6 oz (83.7 kg)    GENERAL:alert, no distress and comfortable SKIN: skin color, texture, turgor are normal, no rashes or significant lesions EYES: normal, conjunctiva are pink and non-injected, sclera clear OROPHARYNX:no exudate, no erythema and lips, buccal mucosa, and tongue normal  NECK: supple, thyroid normal size, non-tender,  without nodularity LYMPH:  no palpable lymphadenopathy in the cervical, axillary or inguinal LUNGS: clear to auscultation and percussion with normal breathing effort HEART: regular rate & rhythm and no murmurs and no lower extremity edema ABDOMEN:abdomen soft, non-tender and normal bowel sounds Musculoskeletal:no cyanosis of digits and no clubbing  PSYCH: alert & oriented x 3 with fluent speech NEURO: no focal motor/sensory deficits Breasts: Breast inspection showed them to be symmetrical, (+) left nipple inversion. There is a  1.5x1.0 cm mass inferior to her left breast nipple, and two additional 37m nodules around it.  Palpation of the right breast and bilateral  axilla revealed no obvious mass that I could appreciate.  LABORATORY DATA:  I have reviewed the data as listed CBC Latest Ref Rng & Units 11/22/2015 08/23/2015 05/07/2015  WBC 3.9 - 10.3 10e3/uL 4.8 6.1 5.4  Hemoglobin 11.6 - 15.9 g/dL 13.3 13.5 13.1  Hematocrit 34.8 - 46.6 % 40.6 40.6 40.7  Platelets 145 - 400 10e3/uL 197 211 220    CMP Latest Ref Rng & Units 11/22/2015 08/23/2015 05/07/2015  Glucose 70 - 140 mg/dl 85 97 102  BUN 7.0 - 26.0 mg/dL 10.6 13.4 12.1  Creatinine 0.6 - 1.1 mg/dL 0.8 0.8  0.8  Sodium 136 - 145 mEq/L 141 140 141  Potassium 3.5 - 5.1 mEq/L 4.5 4.0 4.1  Chloride 96 - 112 mEq/L - - -  CO2 22 - 29 mEq/L 30(H) 29 30(H)  Calcium 8.4 - 10.4 mg/dL 9.4 9.5 9.2  Total Protein 6.4 - 8.3 g/dL 7.8 7.9 7.7  Total Bilirubin 0.20 - 1.20 mg/dL 0.52 0.48 0.43  Alkaline Phos 40 - 150 U/L 88 99 68  AST 5 - 34 U/L 34 38(H) 30  ALT 0 - 55 U/L 28 37 26     PATHOLOGY REPORT Diagnosis 01/26/2014 1. Breast, left, needle core biopsy, mass, lower inner, 6:30 o'clock - INVASIVE MAMMARY CARCINOMA. - SEE COMMENT. 2. Breast, left, needle core biopsy, mass, retroareolar - INVASIVE MAMMARY CARCINOMA. - MAMMARY CARCINOMA IN SITU. - SEE COMMENT. 3. Breast, left, needle core biopsy, mass, 2:30 o'clock - INVASIVE MAMMARY CARCINOMA. - MAMMARY CARCINOMA IN SITU WITH CALCIFICATIONS. - SEE COMMENT. Microscopic Comment 1. The carcinoma is grade I and has some lobular features. 2. The carcinoma on part 2 is morphologically identical to that in part 1. 3. The carcinoma appears grade II and is likely a ductal phenotype. Breast prognostic profile will be performed on parts 1 and 3 and the results reported separately. [facility].  Estrogen Receptor: 100%, POSITIVE, STRONG STAINING INTENSITY Progesterone Receptor: 100%, POSITIVE, STRONG STAINING INTENSITY Proliferation Marker Ki67: 29%  Results: HER-2/NEU BY CISH - NO AMPLIFICATION OF HER-2 DETECTED. RESULT RATIO OF HER2: CEP 17 SIGNALS 0.94 AVERAGE HER2 COPY NUMBER PER CELL 1.65   RADIOGRAPHIC STUDIES: I have personally reviewed the radiological images as listed and agreed with the findings in the report.  Bilateral diagnostic mammogram and left breast and axilla ultrasound6/28/2017 IMPRESSION: Slight decrease in size of left breast 2 o'clock biopsy proven carcinoma.  Relatively stable in size left breast 6:30 o'clock carcinoma. Sonographic examination suggests  segmental disease extension towards the nipple.  Previously identified left subareolar breast mass is not seen sonographically, and demonstrates stable size but decreased density mammographically.  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  No evidence of left axillary lymphadenopathy.  ASSESSMENT & PLAN:  82 year old female with past medical history of stage I endometrial cancer, status post hysterectomy, anxiety and insomnia, who was recently diagnosed with left breast cancer.  1. Left breast cancer, 3 synchronized lesions, 2 of them with lobular features and one has ductal features. T1b-1cN0M0, stage I, ER+/PR+/HER2- -I previously reviewed her breast mass biopsy findings and image findings with patient and her daughter extensively. She has 3 synchronized early stage breast cancer without clinical lymph nodes involvement. I discussed that surgical resection, likely mastectomy due to the location of this 3 masses, would be the only curative treatment for her breast cancer. However she still declines surgery repeatly.  - She has been tolerating anastrozole very well, we'll continue indefinitely if no surgery.  -Her repeated mammogram and ultrasound showed partial response after 1.5 year of treatment. I reviewed the images results with patient and her family members. -She is clinically doing well, no adenopathy on physical exam, left breast mass is stable, left results reviewed with her, no clinical signs or concerns for metastatic disease. -Continue anastrozole. -I'll continue diagnostic mammogram every 6 months  2. Genetics -She has personal history of endometrial cancer, family history of breast cancer and pancreatic cancer. I discussed the genetic screening for inherited breast cancer and lynch syndrome, she declined at this point.  3. Arthritis  -She'll continue  follow-up with her primary care physician and orthopedic surgeon  She will continue to follow-up with her primary care physician for other medical issues.  Plan - continue Arimidex -RTC in 3 month with lab a repeated bilateral mammograms and ultrasound  Truitt Merle  11/22/2015

## 2015-11-25 ENCOUNTER — Encounter: Payer: Self-pay | Admitting: Hematology

## 2015-11-27 NOTE — Addendum Note (Signed)
Addended by: Truitt Merle on: 11/27/2015 05:10 PM   Modules accepted: Orders

## 2016-02-12 ENCOUNTER — Other Ambulatory Visit: Payer: Self-pay | Admitting: *Deleted

## 2016-02-12 DIAGNOSIS — C50912 Malignant neoplasm of unspecified site of left female breast: Secondary | ICD-10-CM

## 2016-02-12 DIAGNOSIS — Z171 Estrogen receptor negative status [ER-]: Principal | ICD-10-CM

## 2016-02-12 DIAGNOSIS — Z170 Estrogen receptor positive status [ER+]: Principal | ICD-10-CM

## 2016-02-15 ENCOUNTER — Encounter: Payer: Self-pay | Admitting: Internal Medicine

## 2016-02-15 ENCOUNTER — Other Ambulatory Visit (INDEPENDENT_AMBULATORY_CARE_PROVIDER_SITE_OTHER): Payer: Medicare Other

## 2016-02-15 ENCOUNTER — Ambulatory Visit (INDEPENDENT_AMBULATORY_CARE_PROVIDER_SITE_OTHER): Payer: Medicare Other | Admitting: Internal Medicine

## 2016-02-15 VITALS — BP 138/78 | HR 77 | Temp 98.0°F | Resp 20 | Wt 181.0 lb

## 2016-02-15 DIAGNOSIS — Z0001 Encounter for general adult medical examination with abnormal findings: Secondary | ICD-10-CM | POA: Diagnosis not present

## 2016-02-15 DIAGNOSIS — R252 Cramp and spasm: Secondary | ICD-10-CM | POA: Diagnosis not present

## 2016-02-15 DIAGNOSIS — E538 Deficiency of other specified B group vitamins: Secondary | ICD-10-CM | POA: Diagnosis not present

## 2016-02-15 DIAGNOSIS — R739 Hyperglycemia, unspecified: Secondary | ICD-10-CM

## 2016-02-15 DIAGNOSIS — F411 Generalized anxiety disorder: Secondary | ICD-10-CM | POA: Diagnosis not present

## 2016-02-15 DIAGNOSIS — Z23 Encounter for immunization: Secondary | ICD-10-CM

## 2016-02-15 DIAGNOSIS — E785 Hyperlipidemia, unspecified: Secondary | ICD-10-CM

## 2016-02-15 LAB — CBC WITH DIFFERENTIAL/PLATELET
BASOS ABS: 0 10*3/uL (ref 0.0–0.1)
BASOS PCT: 0.4 % (ref 0.0–3.0)
Eosinophils Absolute: 0 10*3/uL (ref 0.0–0.7)
Eosinophils Relative: 0.9 % (ref 0.0–5.0)
HCT: 39.9 % (ref 36.0–46.0)
Hemoglobin: 13.5 g/dL (ref 12.0–15.0)
LYMPHS ABS: 1.8 10*3/uL (ref 0.7–4.0)
Lymphocytes Relative: 33.9 % (ref 12.0–46.0)
MCHC: 33.8 g/dL (ref 30.0–36.0)
MCV: 92.6 fl (ref 78.0–100.0)
MONOS PCT: 11.9 % (ref 3.0–12.0)
Monocytes Absolute: 0.6 10*3/uL (ref 0.1–1.0)
NEUTROS ABS: 2.9 10*3/uL (ref 1.4–7.7)
Neutrophils Relative %: 52.9 % (ref 43.0–77.0)
PLATELETS: 242 10*3/uL (ref 150.0–400.0)
RBC: 4.31 Mil/uL (ref 3.87–5.11)
RDW: 13.7 % (ref 11.5–15.5)
WBC: 5.4 10*3/uL (ref 4.0–10.5)

## 2016-02-15 LAB — HEPATIC FUNCTION PANEL
ALK PHOS: 82 U/L (ref 39–117)
ALT: 45 U/L — ABNORMAL HIGH (ref 0–35)
AST: 43 U/L — ABNORMAL HIGH (ref 0–37)
Albumin: 3.8 g/dL (ref 3.5–5.2)
BILIRUBIN DIRECT: 0.1 mg/dL (ref 0.0–0.3)
TOTAL PROTEIN: 7.9 g/dL (ref 6.0–8.3)
Total Bilirubin: 0.5 mg/dL (ref 0.2–1.2)

## 2016-02-15 LAB — URINALYSIS, ROUTINE W REFLEX MICROSCOPIC
BILIRUBIN URINE: NEGATIVE
HGB URINE DIPSTICK: NEGATIVE
Ketones, ur: NEGATIVE
Leukocytes, UA: NEGATIVE
Nitrite: NEGATIVE
Specific Gravity, Urine: >=1.03 — AB (ref 1.000–1.030)
Total Protein, Urine: NEGATIVE
Urine Glucose: NEGATIVE
Urobilinogen, UA: 0.2 (ref 0.0–1.0)
pH: 5.5 (ref 5.0–8.0)

## 2016-02-15 LAB — LIPID PANEL
Cholesterol: 193 mg/dL (ref 0–200)
HDL: 75.4 mg/dL (ref >39.00)
LDL Cholesterol: 100 mg/dL — ABNORMAL HIGH (ref 0–99)
NONHDL: 117.69
Total CHOL/HDL Ratio: 3
Triglycerides: 86 mg/dL (ref 0.0–149.0)
VLDL: 17.2 mg/dL (ref 0.0–40.0)

## 2016-02-15 LAB — BASIC METABOLIC PANEL
BUN: 15 mg/dL (ref 6–23)
CHLORIDE: 102 meq/L (ref 96–112)
CO2: 33 meq/L — AB (ref 19–32)
Calcium: 9.5 mg/dL (ref 8.4–10.5)
Creatinine, Ser: 0.79 mg/dL (ref 0.40–1.20)
GFR: 89.23 mL/min (ref >60.00)
Glucose, Bld: 99 mg/dL (ref 70–99)
Potassium: 5 mEq/L (ref 3.5–5.1)
SODIUM: 140 meq/L (ref 135–145)

## 2016-02-15 LAB — HEMOGLOBIN A1C: HEMOGLOBIN A1C: 5.5 % (ref 4.6–6.5)

## 2016-02-15 LAB — VITAMIN B12: Vitamin B-12: 795 pg/mL (ref 211–911)

## 2016-02-15 LAB — TSH: TSH: 1.32 u[IU]/mL (ref 0.35–4.50)

## 2016-02-15 MED ORDER — CITALOPRAM HYDROBROMIDE 10 MG PO TABS: 10.0000 mg | ORAL_TABLET | Freq: Every day | ORAL | 3 refills | Status: DC

## 2016-02-15 MED ORDER — ALPRAZOLAM 0.5 MG PO TABS: 0.5000 mg | ORAL_TABLET | Freq: Every day | ORAL | 5 refills | Status: DC | PRN

## 2016-02-15 NOTE — Progress Notes (Signed)
Subjective:    Patient ID: Doris Zamora, female    DOB: [birth year], 83 y.o.   MRN: [id number]  HPI  Here to f/u; overall doing ok,  Pt denies chest pain, increasing sob or doe, wheezing, orthopnea, PND, increased LE swelling, palpitations, dizziness or syncope.  Pt denies new neurological symptoms such as new headache, or facial or extremity weakness or numbness.  Pt denies polydipsia, polyuria,  Pt denies new neurological symptoms such as new headache, or facial or extremity weakness or numbness.   Pt states overall good compliance with meds, mostly trying to follow appropriate diet, wt stable Lab Results  Component Value Date   LDLCALC 100 (H) 02/15/2016  Has hx of left knee pain chronic.  More recenlty had episodes of simultansous bilat leg cramps with walking down stairs.  Has gotten away from taking her B complex. No falls.  Denies worsening depressive symptoms, suicidal ideation, or panic; has ongoing anxiety, asks for increased tx Past Medical History:  Diagnosis Date  . ANXIETY   . Endometrial cancer ([place]) 2007  . Hyperglycemia 04/20/2014  . HYPERLIPIDEMIA   . INSOMNIA-SLEEP DISORDER-UNSPEC   . WEIGHT LOSS    Past Surgical History:  Procedure Laterality Date  . ABDOMINAL HYSTERECTOMY  2007  . OOPHORECTOMY  2007  . THYROIDECTOMY, PARTIAL  1983    reports that she quit smoking about 22 years ago. Her smoking use included Cigarettes. She quit after 50.00 years of use. She has never used smokeless tobacco. She reports that she drinks about 4.2 oz of alcohol per week . She reports that she does not use drugs. family history includes Cancer (age of onset: 64) in her other; Cancer (age of onset: 11) in her sister; Diabetes in her mother; Muscular dystrophy in her father. Allergies  Allergen Reactions  . Synthroid [Levothyroxine Sodium] Swelling    Happened yrs ago  . Pneumovax [Pneumococcal Polysaccharide Vaccine]    Current Outpatient Prescriptions on File Prior to Visit    Medication Sig Dispense Refill  . ALPHA-LIPOIC ACID 100 MG TABS Take by mouth daily.    Marland Kitchen anastrozole (ARIMIDEX) 1 MG tablet Take 1 tablet (1 mg total) by mouth daily. 90 tablet 1  . CALCIUM-MAGNESUIUM-ZINC 333-133-8.3 MG TABS Take by mouth daily.    . cetirizine (ZYRTEC) 10 MG tablet Take 1 tablet (10 mg total) by mouth daily. 30 tablet 11  . cholecalciferol (VITAMIN D) 1000 UNITS tablet Take 1,000 Units by mouth daily.    Marland Kitchen co-enzyme Q-10 30 MG capsule Take 100 mg by mouth daily.     . fluticasone (FLONASE) 50 MCG/ACT nasal spray Place 2 sprays into both nostrils daily. 16 g 2  . Multiple Vitamins-Minerals (MULTIVITAMIN WITH MINERALS) tablet Take 1 tablet by mouth daily. Forward Gold    . Omega-3 Fatty Acids (FISH OIL) 1000 MG CPDR Take by mouth daily.    Marland Kitchen OVER THE COUNTER MEDICATION Take by mouth at bedtime as needed. zzquil for sleep     No current facility-administered medications on file prior to visit.    Review of Systems  Constitutional: Negative for unusual diaphoresis or night sweats HENT: Negative for ear swelling or discharge Eyes: Negative for worsening visual haziness  Respiratory: Negative for choking and stridor.   Gastrointestinal: Negative for distension or worsening eructation Genitourinary: Negative for retention or change in urine volume.  Musculoskeletal: Negative for other MSK pain or swelling Skin: Negative for color change and worsening wound Neurological: Negative for tremors and numbness other than noted  Psychiatric/Behavioral: Negative for decreased concentration or agitation other than above   All other system neg per pt    Objective:   Physical Exam BP 138/78   Pulse 77   Temp 98 F (36.7 C) (Oral)   Resp 20   Wt 181 lb (82.1 kg)   SpO2 97%   BMI 29.21 kg/m  VS noted,  Constitutional: Pt appears in no apparent distress HENT: Head: NCAT.  Right Ear: External ear normal.  Left Ear: External ear normal.  Eyes: . Pupils are equal, round, and  reactive to light. Conjunctivae and EOM are normal Neck: Normal range of motion. Neck supple.  Cardiovascular: Normal rate and regular rhythm.   Pulmonary/Chest: Effort normal and breath sounds without rales or wheezing.  Abd:  Soft, NT, ND, + BS Neurological: Pt is alert. Not confused , motor grossly intact Skin: Skin is warm. No rash, no LE edema Psychiatric: Pt behavior is normal. No agitation. Mild nervous  No other new exam findings    Assessment & Plan:

## 2016-02-15 NOTE — Progress Notes (Signed)
Pre visit review using our clinic review tool, if applicable. No additional management support is needed unless otherwise documented below in the visit note. 

## 2016-02-15 NOTE — Patient Instructions (Addendum)
You had the Prevnar pneumonia shot today  Please take all new medication as prescribed - the citalopram 10 mg per day  Please continue all other medications as before, and refills have been done if requested  - the xanax  Please have the pharmacy call with any other refills you may need.  Please continue your efforts at being more active, low cholesterol diet, and weight control.  Please keep your appointments with your specialists as you may have planned  Please go to the LAB in the Basement (turn left off the elevator) for the tests to be done today  You will be contacted by phone if any changes need to be made immediately.  Otherwise, you will receive a letter about your results with an explanation, but please check with MyChart first.  Please remember to sign up for MyChart if you have not done so, as this will be important to you in the future with finding out test results, communicating by private email, and scheduling acute appointments online when needed.  Please return in 6 months, or sooner if needed

## 2016-02-16 NOTE — Assessment & Plan Note (Signed)
Lab Results  Component Value Date   LDLCALC 100 (H) 02/15/2016   stable overall by history and exam, recent data reviewed with pt, and pt to continue medical treatment as before,  to f/u any worsening symptoms or concerns

## 2016-02-16 NOTE — Assessment & Plan Note (Addendum)
Mild increased recently, o/w stable overall by history and exam, recent data reviewed with pt, and pt to continue benzo prn but add citalopram 10 qd as well,  to f/u any worsening symptoms or concerns Lab Results  Component Value Date   WBC 5.4 02/15/2016   HGB 13.5 02/15/2016   HCT 39.9 02/15/2016   PLT 242.0 02/15/2016   GLUCOSE 99 02/15/2016   CHOL 193 02/15/2016   TRIG 86.0 02/15/2016   HDL 75.40 02/15/2016   LDLDIRECT 129.4 02/14/2008   LDLCALC 100 (H) 02/15/2016   ALT 45 (H) 02/15/2016   AST 43 (H) 02/15/2016   NA 140 02/15/2016   K 5.0 02/15/2016   CL 102 02/15/2016   CREATININE 0.79 02/15/2016   BUN 15 02/15/2016   CO2 33 (H) 02/15/2016   TSH 1.32 02/15/2016   HGBA1C 5.5 02/15/2016

## 2016-02-16 NOTE — Assessment & Plan Note (Signed)
stable overall by history and exam, recent data reviewed with pt, and pt to continue medical treatment as before,  to f/u any worsening symptoms or concerns Lab Results  Component Value Date   HGBA1C 5.5 02/15/2016

## 2016-02-16 NOTE — Assessment & Plan Note (Signed)
Mild for b12 check per pt request

## 2016-02-18 NOTE — Progress Notes (Signed)
[place]  [phone] [phone]  Clinic Follow Up Note   Patient Care Team: Biagio Borg, MD as PCP - General (Internal Medicine) Eulis Manly. Gershon Crane, MD as Attending Physician (Ophthalmology) Autumn Messing III, MD as Consulting Physician (General Surgery) Truitt Merle, MD as Consulting Physician (Hematology) 02/19/2016  CHIEF COMPLAINT Follow-up breast cancer  Oncology History   Breast cancer, left breast   Staging form: Breast, AJCC 7th Edition     Clinical: T1c, N0 - Unsigned Endometrial cancer   Staging form: Corpus Uteri - Adenosarcoma, AJCC 7th Edition     Clinical: T1c, N0 - Unsigned       Endometrial cancer ([place])   08/12/2005 Initial Diagnosis    Endometrial cancer, T1bN0, s/p hysrectomy        Breast cancer, left breast ([place])   01/13/2014 Imaging    mammogram and US showed 3 masses at 6 o'clock, retroareolar and 2 o'clock area, measuring 0.7-1.7cm.       01/26/2014 Initial Diagnosis    Breast cancer, left breast, multifocal (3 lesions, biopsy showed 2 similar lesions with lobular features, and the third lesion has ductal features).       02/10/2014 -  Anti-estrogen oral therapy    Pt declined surgery, started anastrozole 1 mg once daily.       09/19/2014 Mammogram     mammogram and ultrasound showed interval decrease in the size and density of the 3 previous biopsy sites of malignancy in the left breast.      CURRENT THERAPY: Anastrozole 1 mg once daily, started on 02/10/2014.  HISTORY OF INITIAL PRESENTING ILLNESS (02/08/2014):  Doris Zamora 83 y.o. female is here because of left breast cancer.  She noticed left breast mass and nipple inversion about months ago. No tenderness or nipple discharge. She otherwise feels well. No change of appetite and weight. ROS (+) indigestion but otherwise negative.  She underwent mammogram and [place] which showed 3 masses at the 6:00, retroareola, and 2:00. The masses measures 1-1.7 cm by mammogram,  0.7-0.9 cm by ultrasound. She underwent subsequent biopsy of this 3 lesions which showed invasive mammary carcinoma, 2 of them showed similar morphology and lobular features, one showed ductal features. Both are ER and PR positive.  She tolerated the biopsy well without complications. She feels well in general, denies any pain or new symptoms. No change in her appetite and weight recently. She was seen by breast surgeon Dr. Marlou Starks yesterday, who recommended mastectomy, and she declined upfront surgery.  INTERIM HISTORY: Doris Zamora returns for follow-up. She Is doing well overall, she tolerates anastrozole without any difficulties. She complains of left knee pain, which is chronic, and she had steroids injection before. She plans to follow up with her orthopedic surgeon in the next few weeks. No other significant arthralgia.  MEDICAL HISTORY:  Past Medical History:  Diagnosis Date  . ANXIETY   . Endometrial cancer ([place]) 2007  . Hyperglycemia 04/20/2014  . HYPERLIPIDEMIA   . INSOMNIA-SLEEP DISORDER-UNSPEC   . WEIGHT LOSS     SURGICAL HISTORY: Past Surgical History:  Procedure Laterality Date  . ABDOMINAL HYSTERECTOMY  2007  . OOPHORECTOMY  2007  . THYROIDECTOMY, PARTIAL  1983   GYN HISTORY  Menarchal: 14 LMP: age of 51 Contraceptive: 1 yr  HRT: no  G3P3:   SOCIAL HISTORY: History   Social History  . Marital Status: Widowed    Spouse Name: N/A    Number of Children: 3  . Years of Education: N/A  Occupational History  . Not on file.   Social History Main Topics  . Smoking status: Former Smoker -- 50 years    Types: Cigarettes    Quit date: 02/10/1994  . Smokeless tobacco: Never Used  . Alcohol Use: 4.2 oz/week    7 Glasses of wine per week     Comment: 2 0z. HS to help her sleep  . Drug Use: No     Comment: admiited to poat use of marjuana to help her sleep  . Sexual Activity: Not on file   Other Topics Concern  . Not on file   Social History Narrative         Widow.  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 Diabetes Mother   . Muscular dystrophy Father   . Cancer Sister 48    pancreatic cancer   . Cancer Other 40    breast cancer     ALLERGIES:  is allergic to synthroid [levothyroxine sodium] and pneumovax [pneumococcal polysaccharide vaccine].  MEDICATIONS:  Allergies as of 02/19/2016      Reactions   Synthroid [levothyroxine Sodium] Swelling   Happened yrs ago   Pneumovax [pneumococcal Polysaccharide Vaccine]       Medication List       Accurate as of 02/19/16  1:03 PM. Always use your most recent med list.          Alpha-Lipoic Acid 100 MG Tabs Take by mouth daily.   ALPRAZolam 0.5 MG tablet Commonly known as:  XANAX Take 1 tablet (0.5 mg total) by mouth daily as needed.   anastrozole 1 MG tablet Commonly known as:  ARIMIDEX Take 1 tablet (1 mg total) by mouth daily.   CALCIUM-MAGNESUIUM-ZINC 333-133-8.3 MG Tabs Take by mouth daily.   cetirizine 10 MG tablet Commonly known as:  ZYRTEC Take 1 tablet (10 mg total) by mouth daily.   cholecalciferol 1000 units tablet Commonly known as:  VITAMIN D Take 1,000 Units by mouth daily.   citalopram 10 MG tablet Commonly known as:  CELEXA Take 1 tablet (10 mg total) by mouth daily.   co-enzyme Q-10 30 MG capsule Take 100 mg by mouth daily.   Fish Oil 1000 MG Cpdr Take by mouth daily.   fluticasone 50 MCG/ACT nasal spray Commonly known as:  FLONASE Place 2 sprays into both nostrils daily.   multivitamin with minerals tablet Take 1 tablet by mouth daily. Forward Gold   OVER THE COUNTER MEDICATION Take by mouth at bedtime as needed. zzquil for sleep      REVIEW OF SYSTEMS:   Constitutional: Denies fevers, chills or abnormal night sweats Eyes: Denies blurriness of vision, double vision or watery eyes Ears, nose, mouth, throat, and face: Denies mucositis or sore throat Respiratory: Denies cough,  dyspnea or wheezes Cardiovascular: Denies palpitation, chest discomfort or lower extremity swelling Gastrointestinal:  Denies nausea, heartburn or change in bowel habits Skin: Denies abnormal skin rashes Lymphatics: Denies new lymphadenopathy or easy bruising Neurological:Denies numbness, tingling or new weaknesses Behavioral/Psych: Mood is stable, no new changes  All other systems were reviewed with the patient and are negative.  PHYSICAL EXAMINATION: ECOG PERFORMANCE STATUS: 1  Vitals:   02/19/16 1024  BP: (!) 153/54  Pulse: 61  Resp: 18  Temp: 97.7 F (36.5 C)   Filed Weights   02/19/16 1024  Weight: 185 lb (83.9 kg)    GENERAL:alert, no distress and comfortable SKIN:  skin color, texture, turgor are normal, no rashes or significant lesions EYES: normal, conjunctiva are pink and non-injected, sclera clear OROPHARYNX:no exudate, no erythema and lips, buccal mucosa, and tongue normal  NECK: supple, thyroid normal size, non-tender, without nodularity LYMPH:  no palpable lymphadenopathy in the cervical, axillary or inguinal LUNGS: clear to auscultation and percussion with normal breathing effort HEART: regular rate & rhythm and no murmurs and no lower extremity edema ABDOMEN:abdomen soft, non-tender and normal bowel sounds Musculoskeletal:no cyanosis of digits and no clubbing  PSYCH: alert & oriented x 3 with fluent speech NEURO: no focal motor/sensory deficits Breasts: Breast inspection showed them to be symmetrical, (+) left nipple inversion. There is a  1.5x1.0 cm mass inferior to her left breast nipple, and two additional 55m nodules around it.  Palpation of the right breast and bilateral  axilla revealed no obvious mass that I could appreciate.  LABORATORY DATA:  I have reviewed the data as listed CBC Latest Ref Rng & Units 02/19/2016 02/15/2016 11/22/2015  WBC 3.9 - 10.3 10e3/uL 4.7 5.4 4.8  Hemoglobin 11.6 - 15.9 g/dL 12.3 13.5 13.3  Hematocrit 34.8 - 46.6 % 37.2 39.9  40.6  Platelets 145 - 400 10e3/uL 202 242.0 197    CMP Latest Ref Rng & Units 02/19/2016 02/15/2016 11/22/2015  Glucose 70 - 140 mg/dl 110 99 85  BUN 7.0 - 26.0 mg/dL 15.5 15 10.6  Creatinine 0.6 - 1.1 mg/dL 0.7 0.79 0.8  Sodium 136 - 145 mEq/L 137 140 141  Potassium 3.5 - 5.1 mEq/L 4.1 5.0 4.5  Chloride 96 - 112 mEq/L - 102 -  CO2 22 - 29 mEq/L 25 33(H) 30(H)  Calcium 8.4 - 10.4 mg/dL 9.3 9.5 9.4  Total Protein 6.4 - 8.3 g/dL 7.6 7.9 7.8  Total Bilirubin 0.20 - 1.20 mg/dL 0.52 0.5 0.52  Alkaline Phos 40 - 150 U/L 85 82 88  AST 5 - 34 U/L 48(H) 43(H) 34  ALT 0 - 55 U/L 50 45(H) 28     PATHOLOGY REPORT Diagnosis 01/26/2014 1. Breast, left, needle core biopsy, mass, lower inner, 6:30 o'clock - INVASIVE MAMMARY CARCINOMA. - SEE COMMENT. 2. Breast, left, needle core biopsy, mass, retroareolar - INVASIVE MAMMARY CARCINOMA. - MAMMARY CARCINOMA IN SITU. - SEE COMMENT. 3. Breast, left, needle core biopsy, mass, 2:30 o'clock - INVASIVE MAMMARY CARCINOMA. - MAMMARY CARCINOMA IN SITU WITH CALCIFICATIONS. - SEE COMMENT. Microscopic Comment 1. The carcinoma is grade I and has some lobular features. 2. The carcinoma on part 2 is morphologically identical to that in part 1. 3. The carcinoma appears grade II and is likely a ductal phenotype. Breast prognostic profile will be performed on parts 1 and 3 and the results reported separately. The results were called to [facility] 01/27/14.  Estrogen Receptor: 100%, POSITIVE, STRONG STAINING INTENSITY Progesterone Receptor: 100%, POSITIVE, STRONG STAINING INTENSITY Proliferation Marker Ki67: 29%  Results: HER-2/NEU BY CISH - NO AMPLIFICATION OF HER-2 DETECTED. RESULT RATIO OF HER2: CEP 17 SIGNALS 0.94 AVERAGE HER2 COPY NUMBER PER CELL 1.65   RADIOGRAPHIC STUDIES: I have personally reviewed the radiological images as listed and agreed with the findings in the report.  Bilateral diagnostic mammogram  02/19/2016 FINDINGS: 2D and 3D full field views of both breasts are performed.  The 2 spiculated masses within the lower left breast and retroareolar left breast are unchanged in appearance since the prior study. Left nipple inversion scratch de left nipple retraction is again identified.  Heterogeneous calcifications within the  upper-outer left breast are unchanged and span a distance of 7.5 cm.  There are no findings of right breast malignancy.  Mammographic images were processed with CAD.  IMPRESSION: Unchanged appearance of 3 synchronous biopsy-proven left breast cancers. Follow-up as clinically indicated.  No mammographic evidence of right breast malignancy.  RECOMMENDATION: Follow-up mammograms as clinically indicated.   ASSESSMENT & PLAN:  83 y.o. female with past medical history of stage I endometrial cancer, status post hysterectomy, anxiety and insomnia, who was recently diagnosed with left breast cancer.  1. Left breast cancer, 3 synchronized lesions, 2 of them with lobular features and one has ductal features. T1b-1cN0M0, stage I, ER+/PR+/HER2- -I previously reviewed her breast mass biopsy findings and image findings with patient and her daughter extensively. She has 3 synchronized early stage breast cancer without clinical lymph nodes involvement. I discussed that surgical resection, likely mastectomy due to the location of this 3 masses, would be the only curative treatment for her breast cancer. However she still declines surgery repeatly.  - She has been tolerating anastrozole very well, we'll continue indefinitely if no surgery.  -Her repeated mammogram and ultrasound showed partial response after 1.5 year of treatment. She has repeated diagnostic mammogram this morning, which showed a stable left breast lesions, no other new lesions. -She is clinically doing well, no adenopathy on physical exam, left breast mass is stable, lab results reviewed with her, no  clinical signs or concerns for metastatic disease. -Continue anastrozole, she is tolerating very well. -I'll continue diagnostic mammogram and [place] every 6 months  2. Genetics -She has personal history of endometrial cancer, family history of breast cancer and pancreatic cancer. I discussed the genetic screening for inherited breast cancer and lynch syndrome, she declined at this point.  3. Arthritis  -She'll continue follow-up with her primary care physician and orthopedic surgeon  She will continue to follow-up with her primary care physician for other medical issues.  Plan - continue Arimidex indefinitely -RTC in 3 month with lab, will order MM and [place] on next visit   Truitt Merle  02/19/2016

## 2016-02-19 ENCOUNTER — Other Ambulatory Visit (HOSPITAL_BASED_OUTPATIENT_CLINIC_OR_DEPARTMENT_OTHER): Payer: Medicare Other

## 2016-02-19 ENCOUNTER — Telehealth: Payer: Self-pay | Admitting: Hematology

## 2016-02-19 ENCOUNTER — Encounter: Payer: Self-pay | Admitting: Hematology

## 2016-02-19 ENCOUNTER — Ambulatory Visit
Admission: RE | Admit: 2016-02-19 | Discharge: 2016-02-19 | Disposition: A | Discharge status: Home / Self Care | Payer: Medicare Other | Source: Ambulatory Visit | Attending: Hematology | Admitting: Hematology

## 2016-02-19 ENCOUNTER — Ambulatory Visit (HOSPITAL_BASED_OUTPATIENT_CLINIC_OR_DEPARTMENT_OTHER): Payer: Medicare Other | Admitting: Hematology

## 2016-02-19 VITALS — BP 153/54 | HR 61 | Temp 97.7°F | Resp 18 | Ht 66.0 in | Wt 185.0 lb

## 2016-02-19 DIAGNOSIS — C50112 Malignant neoplasm of central portion of left female breast: Secondary | ICD-10-CM

## 2016-02-19 DIAGNOSIS — M1990 Unspecified osteoarthritis, unspecified site: Secondary | ICD-10-CM | POA: Diagnosis not present

## 2016-02-19 DIAGNOSIS — C50912 Malignant neoplasm of unspecified site of left female breast: Secondary | ICD-10-CM

## 2016-02-19 DIAGNOSIS — Z170 Estrogen receptor positive status [ER+]: Secondary | ICD-10-CM

## 2016-02-19 DIAGNOSIS — C50812 Malignant neoplasm of overlapping sites of left female breast: Secondary | ICD-10-CM | POA: Diagnosis not present

## 2016-02-19 DIAGNOSIS — Z171 Estrogen receptor negative status [ER-]: Principal | ICD-10-CM

## 2016-02-19 DIAGNOSIS — E2839 Other primary ovarian failure: Secondary | ICD-10-CM

## 2016-02-19 DIAGNOSIS — C50412 Malignant neoplasm of upper-outer quadrant of left female breast: Secondary | ICD-10-CM | POA: Diagnosis not present

## 2016-02-19 DIAGNOSIS — Z79811 Long term (current) use of aromatase inhibitors: Secondary | ICD-10-CM

## 2016-02-19 LAB — CBC WITH DIFFERENTIAL/PLATELET
BASO%: 0.6 % (ref 0.0–2.0)
Basophils Absolute: 0 10*3/uL (ref 0.0–0.1)
EOS ABS: 0 10*3/uL (ref 0.0–0.5)
EOS%: 0.2 % (ref 0.0–7.0)
HCT: 37.2 % (ref 34.8–46.6)
HGB: 12.3 g/dL (ref 11.6–15.9)
LYMPH%: 25.2 % (ref 14.0–49.7)
MCH: 30.4 pg (ref 25.1–34.0)
MCHC: 33.1 g/dL (ref 31.5–36.0)
MCV: 92.1 fL (ref 79.5–101.0)
MONO#: 0.3 10*3/uL (ref 0.1–0.9)
MONO%: 7.2 % (ref 0.0–14.0)
NEUT#: 3.2 10*3/uL (ref 1.5–6.5)
NEUT%: 66.8 % (ref 38.4–76.8)
PLATELETS: 202 10*3/uL (ref 145–400)
RBC: 4.04 10*6/uL (ref 3.70–5.45)
RDW: 13.7 % (ref 11.2–14.5)
WBC: 4.7 10*3/uL (ref 3.9–10.3)
lymph#: 1.2 10*3/uL (ref 0.9–3.3)

## 2016-02-19 LAB — COMPREHENSIVE METABOLIC PANEL
ALT: 50 U/L (ref 0–55)
AST: 48 U/L — ABNORMAL HIGH (ref 5–34)
Albumin: 3.3 g/dL — ABNORMAL LOW (ref 3.5–5.0)
Alkaline Phosphatase: 85 U/L (ref 40–150)
Anion Gap: 7 mEq/L (ref 3–11)
BUN: 15.5 mg/dL (ref 7.0–26.0)
CHLORIDE: 106 meq/L (ref 98–109)
CO2: 25 meq/L (ref 22–29)
Calcium: 9.3 mg/dL (ref 8.4–10.4)
Creatinine: 0.7 mg/dL (ref 0.6–1.1)
Glucose: 110 mg/dl (ref 70–140)
POTASSIUM: 4.1 meq/L (ref 3.5–5.1)
Sodium: 137 mEq/L (ref 136–145)
Total Bilirubin: 0.52 mg/dL (ref 0.20–1.20)
Total Protein: 7.6 g/dL (ref 6.4–8.3)

## 2016-02-19 MED ORDER — ANASTROZOLE 1 MG PO TABS: 1.0000 mg | ORAL_TABLET | Freq: Every day | ORAL | 1 refills | Status: DC

## 2016-02-19 NOTE — Telephone Encounter (Signed)
Appointments scheduled per 1/9 LOS. Patient given AVS report and calendars with future scheduled appointments. °

## 2016-04-15 ENCOUNTER — Other Ambulatory Visit: Payer: Medicare Other

## 2016-05-05 ENCOUNTER — Ambulatory Visit (INDEPENDENT_AMBULATORY_CARE_PROVIDER_SITE_OTHER): Payer: Medicare Other | Admitting: Sports Medicine

## 2016-05-05 ENCOUNTER — Encounter: Payer: Self-pay | Admitting: Sports Medicine

## 2016-05-05 VITALS — BP 130/43 | Ht 65.5 in | Wt 180.0 lb

## 2016-05-05 DIAGNOSIS — M1712 Unilateral primary osteoarthritis, left knee: Secondary | ICD-10-CM

## 2016-05-05 MED ORDER — METHYLPREDNISOLONE ACETATE 40 MG/ML IJ SUSP
40.0000 mg | Freq: Once | INTRAMUSCULAR | Status: AC
  Administered 2016-05-05: 40 mg via INTRA_ARTICULAR

## 2016-05-05 NOTE — Progress Notes (Signed)
   Subjective:    Patient ID: Doris Zamora, female    DOB: [birth year], 83 y.o.   MRN: [id number]  CC: Left knee pain  HPI  Patient comes in today requesting a repeat cortisone injection into her left knee. She has a well-documented history of left knee DJD. She was last seen in the office in 08/2015 and was given Cortisone injection that worked well for her. Pain has returned. It is identical in nature to what she has experienced previously. She denies swelling or fever. She is not on a blood thinner. She walks with a cane.  Past medical history reviewed Medications reviewed Allergies reviewed  Review of Systems Per HPI Objective:   Physical Exam Vitals:   05/05/16 1104  BP: (!) 130/43  Weight: 180 lb (81.6 kg)  Height: 5' 5.5" (1.664 m)   Elderly lady with no apparent distress MSK:  Left knee:  Normal to inspection with no erythema or effusion. Appears symmetric with the right. Mild-to-moderate varus deformity. No warmth to touch, joint line tenderness, condyle tenderness. Positive for crepitus bilaterally ROM full in flexion and extension and lower leg rotation. Grossly stable ligamentous exam Non painful patellar compression. Patellar glide without crepitus. Neurovascularly intact distally. Walking with the assistance of a cane.  Assessment & Plan:  Returning left knee pain secondary to DJD. No apparent swelling or inflammation.   Left knee is injected today. An anterior lateral approach is utilized. Patient tolerates this without difficulty. Follow-up as needed.  Consent obtained and verified. Time-out conducted. Noted no overlying erythema, induration, or other signs of local infection. Skin prepped in a sterile fashion. Topical analgesic spray: Ethyl chloride. Joint: left knee Needle: 22g 1.5 inch Completed without difficulty. Meds: 3 cc 1% xylocaine, 1cc ('40mg'$ ) depomedrol  Advised to call if fevers/chills, erythema, induration, drainage, or persistent  bleeding.  Wendee Beavers, MD.  PGY-2, [facility] Pager [phone] 05/05/16  11:30 AM   Patient seen and evaluated with the resident. I agree with the above plan of care. Patient received several months of pain relief with cortisone injections. Repeat injection performed today. Follow-up as needed.

## 2016-05-17 ENCOUNTER — Telehealth: Payer: Self-pay | Admitting: Hematology

## 2016-05-17 NOTE — Telephone Encounter (Signed)
r/s appt to [facility] from Dr. Burr Medico per schedule message from Dr. Burr Medico.

## 2016-05-19 ENCOUNTER — Encounter: Payer: Self-pay | Admitting: Adult Health

## 2016-05-19 ENCOUNTER — Ambulatory Visit: Payer: Medicare Other | Admitting: Hematology

## 2016-05-19 ENCOUNTER — Ambulatory Visit (HOSPITAL_BASED_OUTPATIENT_CLINIC_OR_DEPARTMENT_OTHER): Payer: Medicare Other | Admitting: Adult Health

## 2016-05-19 ENCOUNTER — Telehealth: Payer: Self-pay | Admitting: Adult Health

## 2016-05-19 ENCOUNTER — Other Ambulatory Visit: Payer: Medicare Other

## 2016-05-19 VITALS — BP 146/52 | HR 62 | Temp 97.5°F | Resp 18 | Ht 65.5 in | Wt 181.7 lb

## 2016-05-19 DIAGNOSIS — C50412 Malignant neoplasm of upper-outer quadrant of left female breast: Secondary | ICD-10-CM | POA: Diagnosis not present

## 2016-05-19 DIAGNOSIS — M1990 Unspecified osteoarthritis, unspecified site: Secondary | ICD-10-CM | POA: Diagnosis not present

## 2016-05-19 DIAGNOSIS — C50812 Malignant neoplasm of overlapping sites of left female breast: Secondary | ICD-10-CM

## 2016-05-19 DIAGNOSIS — C50112 Malignant neoplasm of central portion of left female breast: Secondary | ICD-10-CM

## 2016-05-19 DIAGNOSIS — Z79811 Long term (current) use of aromatase inhibitors: Secondary | ICD-10-CM

## 2016-05-19 DIAGNOSIS — C50912 Malignant neoplasm of unspecified site of left female breast: Secondary | ICD-10-CM

## 2016-05-19 DIAGNOSIS — Z170 Estrogen receptor positive status [ER+]: Secondary | ICD-10-CM | POA: Diagnosis not present

## 2016-05-19 LAB — CBC WITH DIFFERENTIAL/PLATELET
BASO%: 0.9 % (ref 0.0–2.0)
BASOS ABS: 0 10*3/uL (ref 0.0–0.1)
EOS ABS: 0 10*3/uL (ref 0.0–0.5)
EOS%: 0.5 % (ref 0.0–7.0)
HEMATOCRIT: 39 % (ref 34.8–46.6)
HEMOGLOBIN: 13.2 g/dL (ref 11.6–15.9)
LYMPH#: 1.2 10*3/uL (ref 0.9–3.3)
LYMPH%: 26.4 % (ref 14.0–49.7)
MCH: 31.2 pg (ref 25.1–34.0)
MCHC: 33.7 g/dL (ref 31.5–36.0)
MCV: 92.5 fL (ref 79.5–101.0)
MONO#: 0.5 10*3/uL (ref 0.1–0.9)
MONO%: 9.9 % (ref 0.0–14.0)
NEUT#: 2.9 10*3/uL (ref 1.5–6.5)
NEUT%: 62.3 % (ref 38.4–76.8)
Platelets: 208 10*3/uL (ref 145–400)
RBC: 4.22 10*6/uL (ref 3.70–5.45)
RDW: 13.6 % (ref 11.2–14.5)
WBC: 4.7 10*3/uL (ref 3.9–10.3)

## 2016-05-19 LAB — COMPREHENSIVE METABOLIC PANEL
ALBUMIN: 3.4 g/dL — AB (ref 3.5–5.0)
ALK PHOS: 71 U/L (ref 40–150)
ALT: 31 U/L (ref 0–55)
AST: 33 U/L (ref 5–34)
Anion Gap: 9 mEq/L (ref 3–11)
BILIRUBIN TOTAL: 0.68 mg/dL (ref 0.20–1.20)
BUN: 14 mg/dL (ref 7.0–26.0)
CALCIUM: 9.3 mg/dL (ref 8.4–10.4)
CO2: 28 mEq/L (ref 22–29)
CREATININE: 0.8 mg/dL (ref 0.6–1.1)
Chloride: 103 mEq/L (ref 98–109)
EGFR: 85 mL/min/{1.73_m2} — ABNORMAL LOW (ref >90)
Glucose: 104 mg/dl (ref 70–140)
POTASSIUM: 4.3 meq/L (ref 3.5–5.1)
Sodium: 140 mEq/L (ref 136–145)
TOTAL PROTEIN: 7.3 g/dL (ref 6.4–8.3)

## 2016-05-19 NOTE — Progress Notes (Signed)
Doris Zamora  [phone] [phone]  Clinic Follow Up Note   Patient Care Team: Biagio Borg, MD as PCP - General (Internal Medicine) Eulis Manly. Gershon Crane, MD as Attending Physician (Ophthalmology) Autumn Messing III, MD as Consulting Physician (General Surgery) Truitt Merle, MD as Consulting Physician (Hematology) 05/19/2016  CHIEF COMPLAINT Follow-up breast cancer  Oncology History   Breast cancer, left breast   Staging form: Breast, AJCC 7th Edition     Clinical: T1c, N0 - Unsigned Endometrial cancer   Staging form: Corpus Uteri - Adenosarcoma, AJCC 7th Edition     Clinical: T1c, N0 - Unsigned       Endometrial cancer ([place])   08/12/2005 Initial Diagnosis    Endometrial cancer, T1bN0, s/p hysrectomy        Breast cancer, left breast ([place])   01/13/2014 Imaging    mammogram and US showed 3 masses at 6 o'clock, retroareolar and 2 o'clock area, measuring 0.7-1.7cm.       01/26/2014 Initial Diagnosis    Breast cancer, left breast, multifocal (3 lesions, biopsy showed 2 similar lesions with lobular features, and the third lesion has ductal features).       02/10/2014 -  Anti-estrogen oral therapy    Pt declined surgery, started anastrozole 1 mg once daily.       09/19/2014 Mammogram     mammogram and ultrasound showed interval decrease in the size and density of the 3 previous biopsy sites of malignancy in the left breast.      CURRENT THERAPY: Anastrozole 1 mg once daily, started on 02/10/2014.  HISTORY OF INITIAL PRESENTING ILLNESS (02/08/2014):  Doris Zamora 83 y.o. female is here because of left breast cancer.  She noticed left breast mass and nipple inversion about months ago. No tenderness or nipple discharge. She otherwise feels well. No change of appetite and weight. ROS (+) indigestion but otherwise negative.  She underwent mammogram and [place] which showed 3 masses at the 6:00, retroareola, and 2:00. The masses measures 1-1.7 cm by mammogram,  0.7-0.9 cm by ultrasound. She underwent subsequent biopsy of this 3 lesions which showed invasive mammary carcinoma, 2 of them showed similar morphology and lobular features, one showed ductal features. Both are ER and PR positive.  She tolerated the biopsy well without complications. She feels well in general, denies any pain or new symptoms. No change in her appetite and weight recently. She was seen by breast surgeon Dr. Marlou Starks yesterday, who recommended mastectomy, and she declined upfront surgery.  INTERIM HISTORY: Doris Zamora returns for follow-up of her ER positive left breast cancer.  She has declined surgery, so she is taking Anastrozole daily.  She is tolerating this medication very well.  She denies any difficulties, such as pain, or increasing breast lesions since starting it.    MEDICAL HISTORY:  Past Medical History:  Diagnosis Date  . ANXIETY   . Endometrial cancer ([place]) 2007  . Hyperglycemia 04/20/2014  . HYPERLIPIDEMIA   . INSOMNIA-SLEEP DISORDER-UNSPEC   . WEIGHT LOSS     SURGICAL HISTORY: Past Surgical History:  Procedure Laterality Date  . ABDOMINAL HYSTERECTOMY  2007  . OOPHORECTOMY  2007  . THYROIDECTOMY, PARTIAL  1983   GYN HISTORY  Menarchal: 14 LMP: age of 52 Contraceptive: 1 yr  HRT: no  G3P3:   SOCIAL HISTORY: History   Social History  . Marital Status: Widowed    Spouse Name: N/A    Number of Children: 3  . Years of Education: N/A  Occupational History  . Not on file.   Social History Main Topics  . Smoking status: Former Smoker -- 50 years    Types: Cigarettes    Quit date: 02/10/1994  . Smokeless tobacco: Never Used  . Alcohol Use: 4.2 oz/week    7 Glasses of wine per week     Comment: 2 0z. HS to help her sleep  . Drug Use: No     Comment: admiited to poat use of marjuana to help her sleep  . Sexual Activity: Not on file   Other Topics Concern  . Not on file   Social History Narrative         Widow. 3 children - 2 living. retired  Network engineer    FAMILY HISTORY: Family History  Problem Relation Age of Onset  . Diabetes Mother   . Muscular dystrophy Father   . Cancer Sister 43    pancreatic cancer   . Cancer Other 40    breast cancer     ALLERGIES:  is allergic to synthroid [levothyroxine sodium] and pneumovax [pneumococcal polysaccharide vaccine].  MEDICATIONS:  Allergies as of 05/19/2016      Reactions   Synthroid [levothyroxine Sodium] Swelling   Happened yrs ago   Pneumovax [pneumococcal Polysaccharide Vaccine] Other (See Comments)   Patient stated,"I don't know if I'm allergic to this or not. I'm 83 years old."      Medication List       Accurate as of 05/19/16 10:56 AM. Always use your most recent med list.          Alpha-Lipoic Acid 100 MG Tabs Take by mouth daily.   ALPRAZolam 0.5 MG tablet Commonly known as:  XANAX Take 1 tablet (0.5 mg total) by mouth daily as needed.   anastrozole 1 MG tablet Commonly known as:  ARIMIDEX Take 1 tablet (1 mg total) by mouth daily.   CALCIUM-MAGNESUIUM-ZINC 333-133-8.3 MG Tabs Take by mouth daily.   cetirizine 10 MG tablet Commonly known as:  ZYRTEC Take 1 tablet (10 mg total) by mouth daily.   cholecalciferol 1000 units tablet Commonly known as:  VITAMIN D Take 1,000 Units by mouth daily.   citalopram 10 MG tablet Commonly known as:  CELEXA Take 1 tablet (10 mg total) by mouth daily.   co-enzyme Q-10 30 MG capsule Take 100 mg by mouth daily.   Fish Oil 1000 MG Cpdr Take by mouth daily.   fluticasone 50 MCG/ACT nasal spray Commonly known as:  FLONASE Place 2 sprays into both nostrils daily.   multivitamin with minerals tablet Take 1 tablet by mouth daily. Forward Gold   OVER THE COUNTER MEDICATION Take by mouth at bedtime as needed. zzquil for sleep      REVIEW OF SYSTEMS:   Review of Systems  Constitutional: Negative for chills, fever, malaise/fatigue and weight loss.  HENT: Negative for hearing loss and tinnitus.   Eyes:  Negative for blurred vision and double vision.  Respiratory: Negative for cough and shortness of breath.   Cardiovascular: Negative for chest pain, palpitations and leg swelling.  Gastrointestinal: Negative for abdominal pain, blood in stool, constipation, diarrhea, heartburn, nausea and vomiting.  Musculoskeletal: Positive for joint pain (chronic left knee pain). Negative for back pain, myalgias and neck pain.  Skin: Negative for rash.  Neurological: Negative for dizziness, tingling, sensory change, focal weakness, weakness and headaches.  Endo/Heme/Allergies: Negative for environmental allergies. Does not bruise/bleed easily.  Psychiatric/Behavioral: Negative for depression. The patient is not nervous/anxious.  PHYSICAL EXAMINATION: ECOG PERFORMANCE STATUS: 1  Vitals:   05/19/16 1047  BP: (!) 146/52  Pulse: 62  Resp: 18  Temp: 97.5 F (36.4 C)   Filed Weights   05/19/16 1047  Weight: 181 lb 11.2 oz (82.4 kg)   GENERAL: Patient is a well appearing older female in no acute distress HEENT:  Sclerae anicteric. PERRL,  Oropharynx clear and moist. No ulcerations or evidence of oropharyngeal candidiasis. Neck is supple.  NODES:  No cervical, supraclavicular, infraclavicular or axillary lymphadenopathy palpated.  BREAST EXAM:  Right breast without nodules, masses, skin or nipple changes, on left breast there are some very small pinpoint subcutaneous nodules at 6 oclock just under the nipple, nipple inverted, no other masses or nodules felt. LUNGS:  Clear to auscultation bilaterally.  No wheezes or rhonchi. HEART:  Regular rate and rhythm. No murmur appreciated. ABDOMEN:  Soft, nontender.  Positive, normoactive bowel sounds. No organomegaly palpated. MSK:  No focal spinal tenderness to palpation. Full range of motion bilaterally in the upper extremities. EXTREMITIES:  No peripheral edema.   SKIN:  Clear with no obvious rashes or skin changes. No nail dyscrasia. NEURO:  Nonfocal.  Well oriented.  Appropriate affect.    LABORATORY DATA:  I have reviewed the data as listed CBC Latest Ref Rng & Units 05/19/2016 02/19/2016 02/15/2016  WBC 3.9 - 10.3 10e3/uL 4.7 4.7 5.4  Hemoglobin 11.6 - 15.9 g/dL 13.2 12.3 13.5  Hematocrit 34.8 - 46.6 % 39.0 37.2 39.9  Platelets 145 - 400 10e3/uL 208 202 242.0    CMP Latest Ref Rng & Units 02/19/2016 02/15/2016 11/22/2015  Glucose 70 - 140 mg/dl 110 99 85  BUN 7.0 - 26.0 mg/dL 15.5 15 10.6  Creatinine 0.6 - 1.1 mg/dL 0.7 0.79 0.8  Sodium 136 - 145 mEq/L 137 140 141  Potassium 3.5 - 5.1 mEq/L 4.1 5.0 4.5  Chloride 96 - 112 mEq/L - 102 -  CO2 22 - 29 mEq/L 25 33(H) 30(H)  Calcium 8.4 - 10.4 mg/dL 9.3 9.5 9.4  Total Protein 6.4 - 8.3 g/dL 7.6 7.9 7.8  Total Bilirubin 0.20 - 1.20 mg/dL 0.52 0.5 0.52  Alkaline Phos 40 - 150 U/L 85 82 88  AST 5 - 34 U/L 48(H) 43(H) 34  ALT 0 - 55 U/L 50 45(H) 28     PATHOLOGY REPORT Diagnosis 01/26/2014 1. Breast, left, needle core biopsy, mass, lower inner, 6:30 o'clock - INVASIVE MAMMARY CARCINOMA. - SEE COMMENT. 2. Breast, left, needle core biopsy, mass, retroareolar - INVASIVE MAMMARY CARCINOMA. - MAMMARY CARCINOMA IN SITU. - SEE COMMENT. 3. Breast, left, needle core biopsy, mass, 2:30 o'clock - INVASIVE MAMMARY CARCINOMA. - MAMMARY CARCINOMA IN SITU WITH CALCIFICATIONS. - SEE COMMENT. Microscopic Comment 1. The carcinoma is grade I and has some lobular features. 2. The carcinoma on part 2 is morphologically identical to that in part 1. 3. The carcinoma appears grade II and is likely a ductal phenotype. Breast prognostic profile will be performed on parts 1 and 3 and the results reported separately. The results were called to [facility] on 01/27/14.  Estrogen Receptor: 100%, POSITIVE, STRONG STAINING INTENSITY Progesterone Receptor: 100%, POSITIVE, STRONG STAINING INTENSITY Proliferation Marker Ki67: 29%  Results: HER-2/NEU BY CISH - NO AMPLIFICATION OF HER-2  DETECTED. RESULT RATIO OF HER2: CEP 17 SIGNALS 0.94 AVERAGE HER2 COPY NUMBER PER CELL 1.65   RADIOGRAPHIC STUDIES: I have personally reviewed the radiological images as listed and agreed with the findings in the report.  Bilateral diagnostic mammogram 02/19/2016 FINDINGS: 2D and 3D full field views of both breasts are performed.  The 2 spiculated masses within the lower left breast and retroareolar left breast are unchanged in appearance since the prior study. Left nipple inversion scratch de left nipple retraction is again identified.  Heterogeneous calcifications within the upper-outer left breast are unchanged and span a distance of 7.5 cm.  There are no findings of right breast malignancy.  Mammographic images were processed with CAD.  IMPRESSION: Unchanged appearance of 3 synchronous biopsy-proven left breast cancers. Follow-up as clinically indicated.  No mammographic evidence of right breast malignancy.  RECOMMENDATION: Follow-up mammograms as clinically indicated.   ASSESSMENT & PLAN:  83 y.o. female with past medical history of stage I endometrial cancer, status post hysterectomy, anxiety and insomnia, who was recently diagnosed with left breast cancer.  1. Left breast cancer, 3 synchronized lesions, 2 of them with lobular features and one has ductal features. T1b-1cN0M0, stage I, ER+/PR+/HER2- -breast mass biopsy findings and image findings were reviewed with patient and her daughter extensively. She has 3 synchronized early stage breast cancer without clinical lymph nodes involvement. I discussed that surgical resection, likely mastectomy due to the location of this 3 masses, would be the only curative treatment for her breast cancer. However she still declines surgery repeatly.  -Her repeated mammogram and ultrasound showed partial response after 1.5 year of treatment.  -She is clinically doing well, no adenopathy on exam, left breast mass is  stable/?improved, lab results reviewed with patient which are normal, no clinical signs or concerns for metastatic disease. -Continue anastrozole, she is tolerating very well. - diagnostic mammogram and [place] every 6 months-next due in June  2. Genetics -She has personal history of endometrial cancer, family history of breast cancer and pancreatic cancer. She has declined genetic testing.  3. Arthritis  -She'll continue follow-up with her primary care physician and orthopedic surgeon  She will continue to follow-up with her primary care physician for other medical issues.  Plan - continue Arimidex indefinitely, she is tolerating this well.   -Patient will f/u in three months with Dr. Burr Medico for labs and appointment.  She will undergo mammogram and ultrasound prior to her appointment with Dr. Burr Medico.    A total of (30) minutes of face-to-face time was spent with this patient with greater than 50% of that time in counseling and care-coordination.   Scot Dock  05/19/2016

## 2016-05-19 NOTE — Telephone Encounter (Signed)
Gave patient AVS and calender per 4/9 los. Scheduled Mammo and ultrasound with [facility] at [facility].

## 2016-05-29 ENCOUNTER — Ambulatory Visit (INDEPENDENT_AMBULATORY_CARE_PROVIDER_SITE_OTHER): Payer: Medicare Other | Admitting: Sports Medicine

## 2016-05-29 ENCOUNTER — Ambulatory Visit
Admission: RE | Admit: 2016-05-29 | Discharge: 2016-05-29 | Disposition: A | Discharge status: Home / Self Care | Payer: Medicare Other | Source: Ambulatory Visit | Attending: Sports Medicine | Admitting: Sports Medicine

## 2016-05-29 VITALS — BP 156/50

## 2016-05-29 DIAGNOSIS — M25562 Pain in left knee: Principal | ICD-10-CM

## 2016-05-29 DIAGNOSIS — M1712 Unilateral primary osteoarthritis, left knee: Secondary | ICD-10-CM

## 2016-05-29 DIAGNOSIS — G8929 Other chronic pain: Secondary | ICD-10-CM

## 2016-05-29 MED ORDER — METHYLPREDNISOLONE ACETATE 40 MG/ML IJ SUSP
40.0000 mg | Freq: Once | INTRAMUSCULAR | Status: AC
  Administered 2016-05-29: 40 mg via INTRA_ARTICULAR

## 2016-05-30 NOTE — Progress Notes (Signed)
   Subjective:    Patient ID: Doris Zamora, female    DOB: [birth year], 83 y.o.   MRN: [id number]  HPI chief complaint: Left Zamora pain  Patient comes in today requesting a repeat cortisone injection into the left Zamora. This Zamora was last injected last month. Her pain is starting to return. It is diffuse in nature. She has not noticed any swelling. She denies recent trauma. No mechanical symptoms. She is here today with her daughter.    Review of Systems    as above Objective:   Physical Exam  Well-developed, well-nourished. No acute distress. Awake alert and oriented 3. Vital signs reviewed  Left Zamora: Full range of motion. No obvious effusion. She is tender to palpation along the medial joint line with pain but no popping with McMurray's. 1+ patellofemoral crepitus. No tenderness along the lateral joint line. Good ligamentous stability. Neurovascularly intact distally. Walking with a slight limp.  X-rays of the left Zamora including AP, lateral, and sunrise views are reviewed. She is nearly bone-on-bone in the medial compartment. Moderate patellofemoral degenerative changes as well. Nothing acute.      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 An anterior lateral approach was utilized. She tolerated this without difficulty. I explained to her that we are limited in the number and the frequency of cortisone injections that we can do. She understands. Definitive treatment is total Zamora arthroplasty and she appears to be fairly healthy. If her Zamora pain persists despite today's repeat injection then I will likely refer her to orthopedics to discuss the possibility of total Zamora arthroplasty or possibly Visco supplementation. I've also given her a new Zamora brace to wear with activity. Follow-up for ongoing or recalcitrant issues.  Consent obtained and verified. Time-out  conducted. Noted no overlying erythema, induration, or other signs of local infection. Skin prepped in a sterile fashion. Topical analgesic spray: Ethyl chloride. Joint: left Zamora Needle: 22g 1.5 inch Completed without difficulty. Meds: 3cc 1% xylocaine, 1cc ('40mg'$ ) depomedrol  Advised to call if fevers/chills, erythema, induration, drainage, or persistent bleeding.

## 2016-08-04 ENCOUNTER — Other Ambulatory Visit: Payer: Medicare Other

## 2016-08-08 ENCOUNTER — Telehealth: Payer: Self-pay | Admitting: Hematology

## 2016-08-08 NOTE — Telephone Encounter (Signed)
Moved from 07/02 to 07/20, per Dr Burr Medico. (08/08/16) Appointments scheduled and confirmed with patient. Appointment letter and schedule mailed,per patient request.

## 2016-08-11 ENCOUNTER — Other Ambulatory Visit: Payer: Medicare Other

## 2016-08-11 ENCOUNTER — Ambulatory Visit: Payer: Medicare Other | Admitting: Hematology

## 2016-08-18 ENCOUNTER — Ambulatory Visit (INDEPENDENT_AMBULATORY_CARE_PROVIDER_SITE_OTHER): Payer: Medicare Other | Admitting: Sports Medicine

## 2016-08-18 ENCOUNTER — Ambulatory Visit
Admission: RE | Admit: 2016-08-18 | Discharge: 2016-08-18 | Disposition: A | Discharge status: Home / Self Care | Payer: Medicare Other | Source: Ambulatory Visit | Attending: Adult Health | Admitting: Adult Health

## 2016-08-18 ENCOUNTER — Ambulatory Visit
Admission: RE | Admit: 2016-08-18 | Discharge: 2016-08-18 | Disposition: A | Discharge status: Home / Self Care | Payer: Medicare Other | Source: Ambulatory Visit | Attending: Hematology | Admitting: Hematology

## 2016-08-18 ENCOUNTER — Ambulatory Visit: Payer: Medicare Other

## 2016-08-18 VITALS — BP 136/44 | Ht 65.5 in | Wt 180.0 lb

## 2016-08-18 DIAGNOSIS — Z170 Estrogen receptor positive status [ER+]: Principal | ICD-10-CM

## 2016-08-18 DIAGNOSIS — M1712 Unilateral primary osteoarthritis, left knee: Secondary | ICD-10-CM

## 2016-08-18 DIAGNOSIS — E2839 Other primary ovarian failure: Secondary | ICD-10-CM

## 2016-08-18 DIAGNOSIS — C50912 Malignant neoplasm of unspecified site of left female breast: Secondary | ICD-10-CM

## 2016-08-18 MED ORDER — METHYLPREDNISOLONE ACETATE 40 MG/ML IJ SUSP
40.0000 mg | Freq: Once | INTRAMUSCULAR | Status: AC
  Administered 2016-08-18: 40 mg via INTRA_ARTICULAR

## 2016-08-18 NOTE — Progress Notes (Signed)
   Subjective:    Patient ID: Doris Zamora, female    DOB: [birth year], 83 y.o.   MRN: [id number]  HPI chief complaint: Knee pain  Patient comes in today with returning left knee pain. She has a well-documented history of advanced medial compartment DJD. Last cortisone injection was in April. She recently underwent a round of Visco supplementation without any improvement. She would like a repeat cortisone injection today. She denies any trauma. Pain is diffuse. No mechanical symptoms. No swelling. She is here today with her daughter.    Review of Systems As above    Objective:   Physical Exam  Well-developed, well-nourished. No acute distress  Left knee: Full range of motion. No effusion. Tender to palpation along the medial joint line. 1+ patellofemoral crepitus. Good ligament stability. Neurovascularly intact distally. Walking with the assistance of a cane.      Assessment & Plan:   Returning left knee pain secondary to advanced medial compartmental DJD  Patient's left knee was injected today with cortisone. An anterior lateral approach was utilized. She tolerates this without difficulty. Definitive treatment is a total knee arthroplasty. She understands this. Follow-up as needed.  Consent obtained and verified. Time-out conducted. Noted no overlying erythema, induration, or other signs of local infection. Skin prepped in a sterile fashion. Topical analgesic spray: Ethyl chloride. Joint: left knee Needle: 22g 1.5 inch Completed without difficulty. Meds: 3cc 1% xylocaine, 1cc (40mg ) depomedrol  Advised to call if fevers/chills, erythema, induration, drainage, or persistent bleeding.

## 2016-08-26 NOTE — Progress Notes (Signed)
Doris Zamora  Telephone:([phone]) [phone] [phone]) [phone]  Clinic Follow Up Note   Patient Care Team: Biagio Borg, MD as PCP - General (Internal Medicine) Eulis Manly. Gershon Crane, MD as Attending Physician (Ophthalmology) Autumn Messing III, MD as Consulting Physician (General Surgery) Truitt Merle, MD as Consulting Physician (Hematology) 08/29/2016  CHIEF COMPLAINT Follow-up breast cancer  Oncology History   Breast cancer, left breast   Staging form: Breast, AJCC 7th Edition     Clinical: T1c, N0 - Unsigned Endometrial cancer   Staging form: Corpus Uteri - Adenosarcoma, AJCC 7th Edition     Clinical: T1c, N0 - Unsigned       Endometrial cancer ([place])   08/12/2005 Initial Diagnosis    Endometrial cancer, T1bN0, s/p hysrectomy        Breast cancer, left breast ([place])   01/13/2014 Imaging    mammogram and US showed 3 masses at 6 o'clock, retroareolar and 2 o'clock area, measuring 0.7-1.7cm.       01/26/2014 Initial Diagnosis    Breast cancer, left breast, multifocal (3 lesions, biopsy showed 2 similar lesions with lobular features, and the third lesion has ductal features).       02/10/2014 -  Anti-estrogen oral therapy    Pt declined surgery, started anastrozole 1 mg once daily.       09/19/2014 Mammogram     mammogram and ultrasound showed interval decrease in the size and density of the 3 previous biopsy sites of malignancy in the left breast.      08/18/2016 Mammogram    IMPRESSION: Unchanged appearance of the left breast with 3 biopsy proven cancers. No evidence of malignancy in the right breast.      08/18/2016 Imaging    Bone Density 08/18/16 T score -1.4, indicating patient is osteopenic      CURRENT THERAPY: Anastrozole 1 mg once daily, started on 02/10/2014.  HISTORY OF INITIAL PRESENTING ILLNESS (02/08/2014):  Doris Zamora 83 y.o. female is here because of left breast cancer.  She noticed left breast mass and nipple inversion about months ago. No tenderness  or nipple discharge. She otherwise feels well. No change of appetite and weight. ROS (+) indigestion but otherwise negative.  She underwent mammogram and [place] which showed 3 masses at the 6:00, retroareola, and 2:00. The masses measures 1-1.7 cm by mammogram, 0.7-0.9 cm by ultrasound. She underwent subsequent biopsy of this 3 lesions which showed invasive mammary carcinoma, 2 of them showed similar morphology and lobular features, one showed ductal features. Both are ER and PR positive.  She tolerated the biopsy well without complications. She feels well in general, denies any pain or new symptoms. No change in her appetite and weight recently. She was seen by breast surgeon Dr. Marlou Starks yesterday, who recommended mastectomy, and she declined upfront surgery.  INTERIM HISTORY: Doris Zamora returns for follow-up. She reports she is doing well. She denies hot flashes. She reports ongoing pain to her right knee, which is chronic and related to arthritis. She brought a supplement with her today which she says an acquaintance told her has cured cancer. She wonders if she can take this supplement.    MEDICAL HISTORY:  Past Medical History:  Diagnosis Date  . ANXIETY   . Endometrial cancer ([place]) 2007  . Hyperglycemia 04/20/2014  . HYPERLIPIDEMIA   . INSOMNIA-SLEEP DISORDER-UNSPEC   . WEIGHT LOSS     SURGICAL HISTORY: Past Surgical History:  Procedure Laterality Date  . ABDOMINAL HYSTERECTOMY  2007  . OOPHORECTOMY  2007  .  THYROIDECTOMY, PARTIAL  1983   GYN HISTORY  Menarchal: 14 LMP: age of 24 Contraceptive: 1 yr  HRT: no  G3P3:   SOCIAL HISTORY: History   Social History  . Marital Status: Widowed    Spouse Name: N/A    Number of Children: 3  . Years of Education: N/A   Occupational History  . Not on file.   Social History Main Topics  . Smoking status: Former Smoker -- 50 years    Types: Cigarettes    Quit date: 02/10/1994  . Smokeless tobacco: Never Used  . Alcohol Use: 4.2 oz/week     7 Glasses of wine per week     Comment: 2 0z. HS to help her sleep  . Drug Use: No     Comment: admiited to poat use of marjuana to help her sleep  . Sexual Activity: Not on file   Other Topics Concern  . 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 Diabetes Mother   . Muscular dystrophy Father   . Cancer Sister 68       pancreatic cancer   . Cancer Other 40       breast cancer     ALLERGIES:  is allergic to synthroid [levothyroxine sodium] and pneumovax [pneumococcal polysaccharide vaccine].  MEDICATIONS:  Allergies as of 08/29/2016      Reactions   Synthroid [levothyroxine Sodium] Swelling   Happened yrs ago   Pneumovax [pneumococcal Polysaccharide Vaccine] Other (See Comments)   Patient stated,"I don't know if I'm allergic to this or not. I'm 83 years old."      Medication List       Accurate as of 08/29/16 11:48 PM. Always use your most recent med list.          ALPRAZolam 0.5 MG tablet Commonly known as:  XANAX Take 1 tablet (0.5 mg total) by mouth daily as needed.   anastrozole 1 MG tablet Commonly known as:  ARIMIDEX Take 1 tablet (1 mg total) by mouth daily.   cetirizine 10 MG tablet Commonly known as:  ZYRTEC Take 1 tablet (10 mg total) by mouth daily.   cholecalciferol 1000 units tablet Commonly known as:  VITAMIN D Take 1,000 Units by mouth as needed.   co-enzyme Q-10 30 MG capsule Take 100 mg by mouth daily.   Fish Oil 1000 MG Cpdr Take by mouth daily.   fluticasone 50 MCG/ACT nasal spray Commonly known as:  FLONASE Place 2 sprays into both nostrils daily.   multivitamin with minerals tablet Take 1 tablet by mouth daily. Forward Gold   OVER THE COUNTER MEDICATION Take by mouth at bedtime as needed. zzquil for sleep   UNABLE TO FIND Take 2 capsules by mouth 2 (two) times daily as needed. Med Name: IP-6 & Inositol        REVIEW OF SYSTEMS:   Constitutional: Denies fevers, chills or abnormal night sweats, denies hot flashes Eyes: Denies blurriness of vision, double vision or watery eyes Ears, nose, mouth, throat, and face: Denies mucositis or sore throat Respiratory: Denies cough, dyspnea or wheezes Cardiovascular: Denies palpitation, chest discomfort or lower extremity swelling Gastrointestinal:  Denies nausea, heartburn or change in bowel habits Skin: Denies abnormal skin rashes Lymphatics: Denies new lymphadenopathy or easy bruising Neurological:Denies numbness, tingling or new weaknesses Behavioral/Psych: Mood is stable,  no new changes  All other systems were reviewed with the patient and are negative.  PHYSICAL EXAMINATION: ECOG PERFORMANCE STATUS: 1  Vitals:   08/29/16 1123  BP: (!) 138/57  Pulse: 62  Resp: 18  Temp: (!) 96.9 F (36.1 C)   Filed Weights   08/29/16 1123  Weight: 190 lb 14.4 oz (86.6 kg)    GENERAL:alert, no distress and comfortable SKIN: skin color, texture, turgor are normal, no rashes or significant lesions EYES: normal, conjunctiva are pink and non-injected, sclera clear OROPHARYNX:no exudate, no erythema and lips, buccal mucosa, and tongue normal  NECK: supple, thyroid normal size, non-tender, without nodularity LYMPH:  no palpable lymphadenopathy in the cervical, axillary or inguinal LUNGS: clear to auscultation and percussion with normal breathing effort HEART: regular rate & rhythm and no murmurs and no lower extremity edema ABDOMEN:abdomen soft, non-tender and normal bowel sounds Musculoskeletal:no cyanosis of digits and no clubbing  PSYCH: alert & oriented x 3 with fluent speech NEURO: no focal motor/sensory deficits Breasts: Breast inspection showed them to be symmetrical, (+) Palpation of the right breast and bilateral  axilla revealed no obvious mass that I could appreciate. In the elft breast there is a few small lumps in the inferior 6 o'clock  position of the left breast, together measuring approximately 1.5 x 1 cm  LABORATORY DATA:  I have reviewed the data as listed CBC Latest Ref Rng & Units 08/29/2016 05/19/2016 02/19/2016  WBC 3.9 - 10.3 10e3/uL 5.8 4.7 4.7  Hemoglobin 11.6 - 15.9 g/dL 13.5 13.2 12.3  Hematocrit 34.8 - 46.6 % 41.0 39.0 37.2  Platelets 145 - 400 10e3/uL 214 208 202    CMP Latest Ref Rng & Units 08/29/2016 05/19/2016 02/19/2016  Glucose 70 - 140 mg/dl 87 104 110  BUN 7.0 - 26.0 mg/dL 14.3 14.0 15.5  Creatinine 0.6 - 1.1 mg/dL 0.8 0.8 0.7  Sodium 136 - 145 mEq/L 139 140 137  Potassium 3.5 - 5.1 mEq/L 4.3 4.3 4.1  Chloride 96 - 112 mEq/L - - -  CO2 22 - 29 mEq/L _0 Calcium 8.4 - 10.4 mg/dL 9.5 9.3 9.3  Total Protein 6.4 - 8.3 g/dL 8.3 7.3 7.6  Total Bilirubin 0.20 - 1.20 mg/dL 0.44 0.68 0.52  Alkaline Phos 40 - 150 U/L 111 71 85  AST 5 - 34 U/L 87(H) 33 48(H)  ALT 0 - 55 U/L 101(H) 31 50     PATHOLOGY REPORT Diagnosis 01/26/2014 1. Breast, left, needle core biopsy, mass, lower inner, 6:30 o'clock - INVASIVE MAMMARY CARCINOMA. - SEE COMMENT. 2. Breast, left, needle core biopsy, mass, retroareolar - INVASIVE MAMMARY CARCINOMA. - MAMMARY CARCINOMA IN SITU. - SEE COMMENT. 3. Breast, left, needle core biopsy, mass, 2:30 o'clock - INVASIVE MAMMARY CARCINOMA. - MAMMARY CARCINOMA IN SITU WITH CALCIFICATIONS. - SEE COMMENT. Microscopic Comment 1. The carcinoma is grade I and has some lobular features. 2. The carcinoma on part 2 is morphologically identical to that in part 1. 3. The carcinoma appears grade II and is likely a ductal phenotype. Breast prognostic profile will be performed on parts 1 and 3 and the results reported separately. The results were called to [facility] on 01/27/14.  Estrogen Receptor: 100%, POSITIVE, STRONG STAINING INTENSITY Progesterone Receptor: 100%, POSITIVE, STRONG STAINING INTENSITY Proliferation Marker Ki67: 29%  Results: HER-2/NEU BY CISH - NO  AMPLIFICATION OF HER-2 DETECTED. RESULT RATIO OF HER2: CEP 17 SIGNALS 0.94 AVERAGE HER2 COPY NUMBER PER CELL 1.65   RADIOGRAPHIC  STUDIES: I have personally reviewed the radiological images as listed and agreed with the findings in the report.  Bone Density 08/18/16 T score -1.4, indicating patient is osteopenic  Bilateral Diagnostic Mammogram 08/18/16 IMPRESSION: Unchanged appearance of the left breast with 3 biopsy proven cancers. No evidence of malignancy in the right breast.  Bilateral diagnostic mammogram 02/19/2016 FINDINGS: 2D and 3D full field views of both breasts are performed.  The 2 spiculated masses within the lower left breast and retroareolar left breast are unchanged in appearance since the prior study. Left nipple inversion scratch de left nipple retraction is again identified.  Heterogeneous calcifications within the upper-outer left breast are unchanged and span a distance of 7.5 cm.  There are no findings of right breast malignancy.  Mammographic images were processed with CAD.  IMPRESSION: Unchanged appearance of 3 synchronous biopsy-proven left breast cancers. Follow-up as clinically indicated.  No mammographic evidence of right breast malignancy.  RECOMMENDATION: Follow-up mammograms as clinically indicated.   ASSESSMENT & PLAN:  83 y.o. female with past medical history of stage I endometrial cancer, status post hysterectomy, anxiety and insomnia, who was recently diagnosed with left breast cancer.  1. Left breast cancer, 3 synchronized lesions, 2 of them with lobular features and one has ductal features. T1b-1cN0M0, stage I, ER+/PR+/HER2- -I previously reviewed her breast mass biopsy findings and image findings with patient and her daughter extensively. She has 3 synchronized early stage breast cancer without clinical lymph nodes involvement. I discussed that surgical resection, likely mastectomy due to the location of this 3 masses, would be the  only curative treatment for her breast cancer. However she still declines surgery repeatly.  - She has been tolerating anastrozole very well, we'll continue indefinitely if no surgery.  -Her repeated mammogram and ultrasound showed partial response after 2 year of treatment, last mammogram on 08/18/2016 showed stable left breast lesions, no other new lesions. -She is clinically doing well, no adenopathy on physical exam, left breast mass is stable, lab results reviewed with her, no clinical signs or concerns for metastatic disease. -Continue anastrozole, she is tolerating very well. -I'll continue diagnostic mammogram and [place] every 6 months for monitoring. - I reviewed the patient's supplement, IP-6, today. I did not recommend the patient to take it, but she may choose to do so.  - Labs reviewed today, patient has slightly elevated liver enzymes today, 08/29/16. All other labs normal. We will continue to monitor this.  2. Genetics -She has personal history of endometrial cancer, family history of breast cancer and pancreatic cancer. I previously discussed the genetic screening for inherited breast cancer and lynch syndrome, she declined at this point.  3. Arthritis  -She'll continue follow-up with her primary care physician and orthopedic surgeon - She will continue to follow-up with her primary care physician for other medical issues.  4. Osteopenia - DEXA scan on 08/18/16 showed T score -1.4, low risk osteopenia - The patient will begin to take calcium and vitamin D daily to support bone health.  5. Transaminitis -Her lab today showed elevated liver enzymes. She had mild intermittent transaminitis in the past -will continue monitoring, repeat lab in 6 weeks, if gets worse, we'll hold anastrozole, or get a imaging study of her liver.   Plan - Labs reviewed today, liver enzymes slightly elevated. Repeat liver enzyme labs in 6 weeks. - Continue with mammograms every 6 months. - Continue  Anastrozole indefinitely - Begin taking calcium and vitamin D daily. - Follow up in 4 months.  I spent 30 minutes face to face with the patient and more than 50% of that time was spent in counseling and/or coordination of care.  This document serves as a record of services personally performed by Truitt Merle, MD. It was created on her behalf by Maryla Morrow, a trained medical scribe. The creation of this record is based on the scribe's personal observations and the provider's statements to them. This document has been checked and approved by the attending provider.  Truitt Merle  08/29/2016

## 2016-08-29 ENCOUNTER — Ambulatory Visit (HOSPITAL_BASED_OUTPATIENT_CLINIC_OR_DEPARTMENT_OTHER): Payer: Medicare Other | Admitting: Hematology

## 2016-08-29 ENCOUNTER — Other Ambulatory Visit (HOSPITAL_BASED_OUTPATIENT_CLINIC_OR_DEPARTMENT_OTHER): Payer: Medicare Other

## 2016-08-29 ENCOUNTER — Telehealth: Payer: Self-pay | Admitting: Hematology

## 2016-08-29 VITALS — BP 138/57 | HR 62 | Temp 96.9°F | Resp 18 | Ht 65.5 in | Wt 190.9 lb

## 2016-08-29 DIAGNOSIS — Z170 Estrogen receptor positive status [ER+]: Secondary | ICD-10-CM | POA: Diagnosis not present

## 2016-08-29 DIAGNOSIS — C50812 Malignant neoplasm of overlapping sites of left female breast: Secondary | ICD-10-CM | POA: Diagnosis not present

## 2016-08-29 DIAGNOSIS — C50912 Malignant neoplasm of unspecified site of left female breast: Secondary | ICD-10-CM

## 2016-08-29 DIAGNOSIS — C50112 Malignant neoplasm of central portion of left female breast: Secondary | ICD-10-CM | POA: Diagnosis not present

## 2016-08-29 DIAGNOSIS — Z800 Family history of malignant neoplasm of digestive organs: Secondary | ICD-10-CM

## 2016-08-29 DIAGNOSIS — Z79810 Long term (current) use of selective estrogen receptor modulators (SERMs): Secondary | ICD-10-CM

## 2016-08-29 DIAGNOSIS — R740 Nonspecific elevation of levels of transaminase and lactic acid dehydrogenase [LDH]: Secondary | ICD-10-CM

## 2016-08-29 DIAGNOSIS — M1990 Unspecified osteoarthritis, unspecified site: Secondary | ICD-10-CM | POA: Diagnosis not present

## 2016-08-29 DIAGNOSIS — Z8542 Personal history of malignant neoplasm of other parts of uterus: Secondary | ICD-10-CM

## 2016-08-29 DIAGNOSIS — Z803 Family history of malignant neoplasm of breast: Secondary | ICD-10-CM | POA: Diagnosis not present

## 2016-08-29 DIAGNOSIS — C50412 Malignant neoplasm of upper-outer quadrant of left female breast: Secondary | ICD-10-CM

## 2016-08-29 DIAGNOSIS — M8580 Other specified disorders of bone density and structure, unspecified site: Secondary | ICD-10-CM | POA: Diagnosis not present

## 2016-08-29 LAB — CBC WITH DIFFERENTIAL/PLATELET
BASO%: 0.6 % (ref 0.0–2.0)
BASOS ABS: 0 10*3/uL (ref 0.0–0.1)
EOS ABS: 0 10*3/uL (ref 0.0–0.5)
EOS%: 0.5 % (ref 0.0–7.0)
HEMATOCRIT: 41 % (ref 34.8–46.6)
HGB: 13.5 g/dL (ref 11.6–15.9)
LYMPH#: 1.6 10*3/uL (ref 0.9–3.3)
LYMPH%: 28.3 % (ref 14.0–49.7)
MCH: 30.5 pg (ref 25.1–34.0)
MCHC: 33 g/dL (ref 31.5–36.0)
MCV: 92.6 fL (ref 79.5–101.0)
MONO#: 0.9 10*3/uL (ref 0.1–0.9)
MONO%: 14.8 % — ABNORMAL HIGH (ref 0.0–14.0)
NEUT#: 3.2 10*3/uL (ref 1.5–6.5)
NEUT%: 55.8 % (ref 38.4–76.8)
PLATELETS: 214 10*3/uL (ref 145–400)
RBC: 4.43 10*6/uL (ref 3.70–5.45)
RDW: 13.7 % (ref 11.2–14.5)
WBC: 5.8 10*3/uL (ref 3.9–10.3)

## 2016-08-29 LAB — COMPREHENSIVE METABOLIC PANEL
ALBUMIN: 3.5 g/dL (ref 3.5–5.0)
ALK PHOS: 111 U/L (ref 40–150)
ALT: 101 U/L — ABNORMAL HIGH (ref 0–55)
AST: 87 U/L — AB (ref 5–34)
Anion Gap: 7 mEq/L (ref 3–11)
BUN: 14.3 mg/dL (ref 7.0–26.0)
CALCIUM: 9.5 mg/dL (ref 8.4–10.4)
CO2: 27 mEq/L (ref 22–29)
Chloride: 105 mEq/L (ref 98–109)
Creatinine: 0.8 mg/dL (ref 0.6–1.1)
EGFR: 82 mL/min/{1.73_m2} — ABNORMAL LOW (ref >90)
Glucose: 87 mg/dl (ref 70–140)
POTASSIUM: 4.3 meq/L (ref 3.5–5.1)
Sodium: 139 mEq/L (ref 136–145)
Total Bilirubin: 0.44 mg/dL (ref 0.20–1.20)
Total Protein: 8.3 g/dL (ref 6.4–8.3)

## 2016-08-29 NOTE — Telephone Encounter (Signed)
Scheduled appt per 7/20 los - Gave patient AVS and calender per los.  

## 2016-08-31 ENCOUNTER — Encounter: Payer: Self-pay | Admitting: Hematology

## 2016-09-12 ENCOUNTER — Encounter (HOSPITAL_COMMUNITY): Payer: Self-pay | Admitting: Emergency Medicine

## 2016-09-12 ENCOUNTER — Emergency Department (HOSPITAL_COMMUNITY): Payer: Medicare Other

## 2016-09-12 ENCOUNTER — Emergency Department (HOSPITAL_COMMUNITY)
Admission: EM | Admit: 2016-09-12 | Discharge: 2016-09-12 | Disposition: A | Discharge status: Home / Self Care | Payer: Medicare Other | Attending: Emergency Medicine | Admitting: Emergency Medicine

## 2016-09-12 DIAGNOSIS — Z79899 Other long term (current) drug therapy: Secondary | ICD-10-CM | POA: Diagnosis not present

## 2016-09-12 DIAGNOSIS — W19XXXA Unspecified fall, initial encounter: Secondary | ICD-10-CM

## 2016-09-12 DIAGNOSIS — M549 Dorsalgia, unspecified: Secondary | ICD-10-CM

## 2016-09-12 DIAGNOSIS — Y999 Unspecified external cause status: Secondary | ICD-10-CM | POA: Insufficient documentation

## 2016-09-12 DIAGNOSIS — M546 Pain in thoracic spine: Secondary | ICD-10-CM | POA: Insufficient documentation

## 2016-09-12 DIAGNOSIS — Y929 Unspecified place or not applicable: Secondary | ICD-10-CM | POA: Diagnosis not present

## 2016-09-12 DIAGNOSIS — T148XXA Other injury of unspecified body region, initial encounter: Secondary | ICD-10-CM | POA: Diagnosis not present

## 2016-09-12 DIAGNOSIS — W010XXA Fall on same level from slipping, tripping and stumbling without subsequent striking against object, initial encounter: Secondary | ICD-10-CM | POA: Insufficient documentation

## 2016-09-12 DIAGNOSIS — Y939 Activity, unspecified: Secondary | ICD-10-CM | POA: Insufficient documentation

## 2016-09-12 MED ORDER — ACETAMINOPHEN 500 MG PO TABS
1000.0000 mg | ORAL_TABLET | Freq: Once | ORAL | Status: AC
  Administered 2016-09-12: 1000 mg via ORAL
  Filled 2016-09-12: qty 2

## 2016-09-12 NOTE — ED Notes (Signed)
Bed: RU04 Expected date:  Expected time:  Means of arrival:  Comments: EMS 83 yo female fall with flank pain

## 2016-09-12 NOTE — ED Provider Notes (Signed)
[facility] DEPT Provider Note   CSN: [id number] Arrival date & time: 09/12/16  1949     History   Chief Complaint Chief Complaint  Patient presents with  . Fall    HPI Doris Zamora is a 83 y.o. female.  The history is provided by the patient.  Fall  This is a new problem. The current episode started 3 to 5 hours ago. Episode frequency: once. Pertinent negatives include no chest pain, no abdominal pain, no headaches and no shortness of breath. Nothing aggravates the symptoms. Nothing relieves the symptoms. She has tried nothing for the symptoms.   Slipped on wet floor. No head trauma. No LOC. No anticoagulation. Complains of thoracic back pain. Pain is exacerbated with movement and palpation. No radiation. No urinary or bowel incontinence. Pain improved with TENS machine and topical muscle cream.    Past Medical History:  Diagnosis Date  . ANXIETY   . Endometrial cancer ([place]) 2007  . Hyperglycemia 04/20/2014  . HYPERLIPIDEMIA   . INSOMNIA-SLEEP DISORDER-UNSPEC   . WEIGHT LOSS     Patient Active Problem List   Diagnosis Date Noted  . Leg cramps 02/15/2016  . Gait disorder 05/03/2015  . Acute upper respiratory infection 04/13/2015  . Allergic rhinitis 04/20/2014  . Hyperglycemia 04/20/2014  . Breast cancer, left breast ([place]) 02/08/2014  . Lower back pain 09/21/2013  . Left hip pain 09/21/2013  . Primary localized osteoarthrosis, lower leg 06/14/2013  . Knee effusion, left 05/10/2013  . Endometrial cancer ([place])   . Abnormal CT scan 06/17/2012  . Encounter for well adult exam with abnormal findings 01/11/2012  . Obesity (BMI 30.0-34.9) 02/14/2008  . Hyperlipidemia 08/11/2007  . Anxiety state 08/11/2007  . INSOMNIA-SLEEP DISORDER-UNSPEC 08/11/2007    Past Surgical History:  Procedure Laterality Date  . ABDOMINAL HYSTERECTOMY  2007  . OOPHORECTOMY  2007  . THYROIDECTOMY, PARTIAL  1983    OB History    No data available       Home Medications    Prior  to Admission medications   Medication Sig Start Date End Date Taking? Authorizing Provider  anastrozole (ARIMIDEX) 1 MG tablet Take 1 tablet (1 mg total) by mouth daily. 02/19/16  Yes Truitt Merle, MD  cholecalciferol (VITAMIN D) 1000 UNITS tablet Take 1,000 Units by mouth daily.    Yes [provider]  co-enzyme Q-10 30 MG capsule Take 100 mg by mouth daily.    Yes [provider]  Homeopathic Products (CALM PLUS SL) Place 1 packet under the tongue daily.   Yes [provider]  Multiple Vitamins-Minerals (MULTIVITAMIN WITH MINERALS) tablet Take 1 tablet by mouth daily. Forward Gold   Yes [provider]  Omega-3 Fatty Acids (FISH OIL) 1000 MG CPDR Take 1 capsule by mouth daily.    Yes [provider]  OVER THE COUNTER MEDICATION Take 1 tablet by mouth at bedtime as needed. zzquil for sleep    Yes [provider]  ALPRAZolam (XANAX) 0.5 MG tablet Take 1 tablet (0.5 mg total) by mouth daily as needed. Patient taking differently: Take 0.5 mg by mouth daily as needed for anxiety or sleep.  02/15/16   Biagio Borg, MD  cetirizine (ZYRTEC) 10 MG tablet Take 1 tablet (10 mg total) by mouth daily. Patient not taking: Reported on 09/12/2016 04/20/14   Biagio Borg, MD  fluticasone [facility]) 50 MCG/ACT nasal spray Place 2 sprays into both nostrils daily. Patient not taking: Reported on 09/12/2016 04/20/14   Cathlean Cower  W, MD    Family History Family History  Problem Relation Age of Onset  . Diabetes Mother   . Muscular dystrophy Father   . Cancer Other 40       breast cancer   . Cancer Sister 71       pancreatic cancer     Social History Social History  Substance Use Topics  . Smoking status: Former Smoker    Years: 50.00    Types: Cigarettes    Quit date: 02/10/1994  . Smokeless tobacco: Never Used  . Alcohol use 4.2 oz/week    7 Glasses of wine per week     Comment: 2 0z. HS to help her sleep     Allergies   Synthroid [levothyroxine sodium]  and Pneumovax [pneumococcal polysaccharide vaccine]   Review of Systems Review of Systems  Respiratory: Negative for shortness of breath.   Cardiovascular: Negative for chest pain.  Gastrointestinal: Negative for abdominal pain.  Neurological: Negative for headaches.  All other systems are reviewed and are negative for acute change except as noted in the HPI    Physical Exam Updated Vital Signs BP (!) 157/63   Pulse 69   Temp 97.9 F (36.6 C) (Oral)   Resp 16   SpO2 97%   Physical Exam  Constitutional: She is oriented to person, place, and time. She appears well-developed and well-nourished. No distress.  HENT:  Head: Normocephalic and atraumatic.  Right Ear: External ear normal.  Left Ear: External ear normal.  Nose: Nose normal.  Eyes: Pupils are equal, round, and reactive to light. Conjunctivae and EOM are normal. Right eye exhibits no discharge. Left eye exhibits no discharge. No scleral icterus.  Neck: Normal range of motion. Neck supple.  Cardiovascular: Normal rate, regular rhythm and normal heart sounds.  Exam reveals no gallop and no friction rub.   No murmur heard. Pulses:      Radial pulses are 2+ on the right side, and 2+ on the left side.       Dorsalis pedis pulses are 2+ on the right side, and 2+ on the left side.  Pulmonary/Chest: Effort normal and breath sounds normal. No stridor. No respiratory distress. She has no wheezes.  Abdominal: Soft. She exhibits no distension. There is no tenderness.  Musculoskeletal: She exhibits no edema.       Cervical back: She exhibits no bony tenderness.       Thoracic back: She exhibits tenderness and bony tenderness.       Lumbar back: She exhibits no bony tenderness.       Back:  Clavicles stable. Chest stable to AP/Lat compression. Pelvis stable to Lat compression. No obvious extremity deformity. No chest or abdominal wall contusion.  Neurological: She is alert and oriented to person, place, and time.  Moving all  extremities  Skin: Skin is warm and dry. No rash noted. She is not diaphoretic. No erythema.  Psychiatric: She has a normal mood and affect.     ED Treatments / Results  Labs (all labs ordered are listed, but only abnormal results are displayed) Labs Reviewed - No data to display  EKG  EKG Interpretation None       Radiology Dg Thoracic Spine 2 View  Result Date: 09/12/2016 CLINICAL DATA:  C/o right side thoracic pain s/p fall tonight at home. Pt states she slipped in epsom salt that was on the floor. Hx endometrial ca EXAM: THORACIC SPINE 2 VIEWS COMPARISON:  None. FINDINGS: No evidence of acute  fracture within the thoracic spine. No vertebral body subluxation. Mild degenerative spurring throughout. Atherosclerosis of the thoracic aorta. Visualized paravertebral soft tissues are otherwise unremarkable. IMPRESSION: No acute findings. No evidence of acute fracture or subluxation within the thoracic spine. Aortic atherosclerosis. Mild degenerative change throughout the thoracic spine. Electronically Signed   By: Franki Cabot M.D.   On: 09/12/2016 22:15    Procedures Procedures (including critical care time)  Medications Ordered in ED Medications  acetaminophen (TYLENOL) tablet 1,000 mg (1,000 mg Oral Given 09/12/16 2147)     Initial Impression / Assessment and Plan / ED Course  I have reviewed the triage vital signs and the nursing notes.  Pertinent labs & imaging results that were available during my care of the patient were reviewed by me and considered in my medical decision making (see chart for details).    No head trauma or current headache. Patient is not anticoagulated. Do not feel that CT of the head is warranted at this time.  Mechanical fall from standing resulting in mid back pain. Plain film negative for any acute fractures or dislocations. Likely muscular in nature.  Patient provided with by mouth pain medicine. Recommended continuing symptomatic treatment.  The  patient is safe for discharge with strict return precautions.     Final Clinical Impressions(s) / ED Diagnoses   Final diagnoses:  Fall, initial encounter  Mid back pain  Muscle strain   Disposition: Discharge  Condition: Good  I have discussed the results, Dx and Tx plan with the patient who expressed understanding and agree(s) with the plan. Discharge instructions discussed at great length. The patient was given strict return precautions who verbalized understanding of the instructions. No further questions at time of discharge.    New Prescriptions   No medications on file    Follow Up: Biagio Borg, MD [place] [id number] [phone]  Schedule an appointment as soon as possible for a visit in 2 weeks If symptoms do not improve or  worsen      Cardama, Grayce Sessions, MD 09/12/16 2234

## 2016-09-12 NOTE — ED Triage Notes (Signed)
Pt presents to ED via [facility] to be evaluated for fall. Pt states she was trying to feeding her dog and she slipped and fell. No obvious injuries noted. Pt c/o left flank pain, pain worse when she moves, no bruise noted on site. Pt denies LOC. Pt alerts and oriented x4 at this time.

## 2016-09-24 ENCOUNTER — Other Ambulatory Visit (INDEPENDENT_AMBULATORY_CARE_PROVIDER_SITE_OTHER): Payer: Medicare Other

## 2016-09-24 ENCOUNTER — Ambulatory Visit (INDEPENDENT_AMBULATORY_CARE_PROVIDER_SITE_OTHER): Payer: Medicare Other | Admitting: Internal Medicine

## 2016-09-24 ENCOUNTER — Encounter: Payer: Self-pay | Admitting: Internal Medicine

## 2016-09-24 VITALS — BP 126/76 | HR 74 | Temp 98.1°F | Ht 65.5 in | Wt 195.0 lb

## 2016-09-24 DIAGNOSIS — M25511 Pain in right shoulder: Secondary | ICD-10-CM

## 2016-09-24 DIAGNOSIS — Z0001 Encounter for general adult medical examination with abnormal findings: Secondary | ICD-10-CM

## 2016-09-24 DIAGNOSIS — F411 Generalized anxiety disorder: Secondary | ICD-10-CM

## 2016-09-24 DIAGNOSIS — R739 Hyperglycemia, unspecified: Secondary | ICD-10-CM

## 2016-09-24 DIAGNOSIS — Z0000 Encounter for general adult medical examination without abnormal findings: Secondary | ICD-10-CM

## 2016-09-24 DIAGNOSIS — M25562 Pain in left knee: Secondary | ICD-10-CM

## 2016-09-24 DIAGNOSIS — G8929 Other chronic pain: Secondary | ICD-10-CM | POA: Diagnosis not present

## 2016-09-24 DIAGNOSIS — M25561 Pain in right knee: Secondary | ICD-10-CM

## 2016-09-24 LAB — CBC WITH DIFFERENTIAL/PLATELET
BASOS ABS: 0.1 10*3/uL (ref 0.0–0.1)
BASOS PCT: 0.9 % (ref 0.0–3.0)
Eosinophils Absolute: 0 10*3/uL (ref 0.0–0.7)
Eosinophils Relative: 0.5 % (ref 0.0–5.0)
HEMATOCRIT: 39.7 % (ref 36.0–46.0)
Hemoglobin: 13 g/dL (ref 12.0–15.0)
LYMPHS ABS: 1.9 10*3/uL (ref 0.7–4.0)
Lymphocytes Relative: 34.9 % (ref 12.0–46.0)
MCHC: 32.8 g/dL (ref 30.0–36.0)
MCV: 94.6 fl (ref 78.0–100.0)
Monocytes Absolute: 0.6 10*3/uL (ref 0.1–1.0)
Monocytes Relative: 12.1 % — ABNORMAL HIGH (ref 3.0–12.0)
Neutro Abs: 2.8 10*3/uL (ref 1.4–7.7)
Neutrophils Relative %: 51.6 % (ref 43.0–77.0)
PLATELETS: 248 10*3/uL (ref 150.0–400.0)
RBC: 4.19 Mil/uL (ref 3.87–5.11)
RDW: 13.7 % (ref 11.5–15.5)
WBC: 5.4 10*3/uL (ref 4.0–10.5)

## 2016-09-24 LAB — LIPID PANEL
CHOLESTEROL: 184 mg/dL (ref 0–200)
HDL: 73 mg/dL (ref >39.00)
LDL Cholesterol: 95 mg/dL (ref 0–99)
NonHDL: 110.6
TRIGLYCERIDES: 76 mg/dL (ref 0.0–149.0)
Total CHOL/HDL Ratio: 3
VLDL: 15.2 mg/dL (ref 0.0–40.0)

## 2016-09-24 LAB — URINALYSIS, ROUTINE W REFLEX MICROSCOPIC
BILIRUBIN URINE: NEGATIVE
HGB URINE DIPSTICK: NEGATIVE
KETONES UR: NEGATIVE
LEUKOCYTES UA: NEGATIVE
Nitrite: NEGATIVE
RBC / HPF: NONE SEEN (ref 0–?)
Specific Gravity, Urine: 1.025 (ref 1.000–1.030)
Total Protein, Urine: NEGATIVE
UROBILINOGEN UA: 0.2 (ref 0.0–1.0)
Urine Glucose: NEGATIVE
pH: 5.5 (ref 5.0–8.0)

## 2016-09-24 LAB — HEPATIC FUNCTION PANEL
ALBUMIN: 3.5 g/dL (ref 3.5–5.2)
ALT: 96 U/L — AB (ref 0–35)
AST: 84 U/L — AB (ref 0–37)
Alkaline Phosphatase: 97 U/L (ref 39–117)
BILIRUBIN TOTAL: 0.4 mg/dL (ref 0.2–1.2)
Bilirubin, Direct: 0.2 mg/dL (ref 0.0–0.3)
Total Protein: 7.6 g/dL (ref 6.0–8.3)

## 2016-09-24 LAB — TSH: TSH: 1.83 u[IU]/mL (ref 0.35–4.50)

## 2016-09-24 LAB — BASIC METABOLIC PANEL
BUN: 17 mg/dL (ref 6–23)
CO2: 33 mEq/L — ABNORMAL HIGH (ref 19–32)
Calcium: 9.4 mg/dL (ref 8.4–10.5)
Chloride: 102 mEq/L (ref 96–112)
Creatinine, Ser: 0.67 mg/dL (ref 0.40–1.20)
GFR: 107.76 mL/min (ref >60.00)
GLUCOSE: 101 mg/dL — AB (ref 70–99)
POTASSIUM: 3.8 meq/L (ref 3.5–5.1)
Sodium: 137 mEq/L (ref 135–145)

## 2016-09-24 LAB — HEMOGLOBIN A1C: Hgb A1c MFr Bld: 5.6 % (ref 4.6–6.5)

## 2016-09-24 MED ORDER — FLUTICASONE PROPIONATE 50 MCG/ACT NA SUSP: 2.0000 | Freq: Every day | NASAL | 2 refills | Status: DC

## 2016-09-24 MED ORDER — CETIRIZINE HCL 10 MG PO TABS: 10.0000 mg | ORAL_TABLET | Freq: Every day | ORAL | 11 refills | Status: DC

## 2016-09-24 MED ORDER — ALPRAZOLAM 0.5 MG PO TABS: 0.5000 mg | ORAL_TABLET | Freq: Every day | ORAL | 5 refills | Status: DC | PRN

## 2016-09-24 MED ORDER — TRIAMCINOLONE ACETONIDE 0.1 % EX CREA: 1.0000 "application " | TOPICAL_CREAM | Freq: Two times a day (BID) | CUTANEOUS | 1 refills | Status: DC

## 2016-09-24 NOTE — Progress Notes (Signed)
Subjective:    Patient ID: Doris Zamora, female    DOB: 1930-08-25, 83 y.o.   MRN: [id number]  HPI  Here for wellness and f/u;  Overall doing ok;  Pt denies Chest pain, worsening SOB, DOE, wheezing, orthopnea, PND, worsening LE edema, palpitations, dizziness or syncope.  Pt denies neurological change such as new headache, facial or extremity weakness.  Pt denies polydipsia, polyuria, or low sugar symptoms. Pt states overall good compliance with treatment and medications, good tolerability, and has been trying to follow appropriate diet.v  No fever, night sweats, wt loss, loss of appetite, or other constitutional symptoms.  Pt states good ability with ADL's, has low fall risk, home safety reviewed and adequate, no other significant changes in hearing or vision, and not active with exercise.  Declines immunizations.  No other history interval change except:   Did have an unfortunate Aug 3 fall on slick floor, somewhat caught by her daughter standing there, no injury except fell to right shoulder that was already mild painful. Seen and tx in ED, neg xray for fx, and tylenol prn.  Pain is constant, mod to severe, sharp, without radiations or extremity weakness or numbness or neck pain.  Also mention has has bilat knee arthritis and walks with cane. - asks for volt gel to help with knee pain. Still goes to [employer] for exercise daily.  Denies worsening depressive symptoms, suicidal ideation, or panic; has ongoing anxiety, not increased recently.  Asks for xanx refill.  New citalopram in jan 2018 has helped quite a bit and tolerating well. Declines need for counseling. Past Medical History:  Diagnosis Date  . ANXIETY   . Endometrial cancer ([place]) 2007  . Hyperglycemia 04/20/2014  . HYPERLIPIDEMIA   . INSOMNIA-SLEEP DISORDER-UNSPEC   . WEIGHT LOSS    Past Surgical History:  Procedure Laterality Date  . ABDOMINAL HYSTERECTOMY  2007  . OOPHORECTOMY  2007  . THYROIDECTOMY, PARTIAL  1983    reports that she quit smoking about 22 years ago. Her smoking use included Cigarettes. She quit after 50.00 years of use. She has never used smokeless tobacco. She reports that she drinks about 4.2 oz of alcohol per week . She reports that she does not use drugs. family history includes Cancer (age of onset: 1) in her other; Cancer (age of onset: 67) in her sister; Diabetes in her mother; Muscular dystrophy in her father. Allergies  Allergen Reactions  . Synthroid [Levothyroxine Sodium] Swelling    Happened yrs ago  . Pneumovax [Pneumococcal Polysaccharide Vaccine] Other (See Comments)    Patient stated,"I don't know if I'm allergic to this or not. I'm 83 years old."   Current Outpatient Prescriptions on File Prior to Visit  Medication Sig Dispense Refill  . anastrozole (ARIMIDEX) 1 MG tablet Take 1 tablet (1 mg total) by mouth daily. 90 tablet 1  . cholecalciferol (VITAMIN D) 1000 UNITS tablet Take 1,000 Units by mouth daily.     Marland Kitchen co-enzyme Q-10 30 MG capsule Take 100 mg by mouth daily.     . Homeopathic Products (CALM PLUS SL) Place 1 packet under the tongue daily.    . Multiple Vitamins-Minerals (MULTIVITAMIN WITH MINERALS) tablet Take 1 tablet by mouth daily. Forward Gold    . Omega-3 Fatty Acids (FISH OIL) 1000 MG CPDR Take 1 capsule by mouth daily.     Marland Kitchen OVER THE COUNTER MEDICATION Take 1 tablet by mouth at bedtime as needed. zzquil for sleep  No current facility-administered medications on file prior to visit.    Review of Systems Constitutional: Negative for other unusual diaphoresis, sweats, appetite or weight changes HENT: Negative for other worsening hearing loss, ear pain, facial swelling, mouth sores or neck stiffness.   Eyes: Negative for other worsening pain, redness or other visual disturbance.  Respiratory: Negative for other stridor or swelling Cardiovascular: Negative for other palpitations or other chest pain  Gastrointestinal: Negative for worsening diarrhea or  loose stools, blood in stool, distention or other pain Genitourinary: Negative for hematuria, flank pain or other change in urine volume.  Musculoskeletal: Negative for myalgias or other joint swelling.  Skin: Negative for other color change, or other wound or worsening drainage.  Neurological: Negative for other syncope or numbness. Hematological: Negative for other adenopathy or swelling Psychiatric/Behavioral: Negative for hallucinations, other worsening agitation, SI, self-injury, or new decreased concentration No other exam findings    Objective:   Physical Exam ,BP 126/76   Pulse 74   Temp 98.1 F (36.7 C)   Ht 5' 5.5" (1.664 m)   Wt 195 lb (88.5 kg)   SpO2 99%   BMI 31.96 kg/m  VS noted,  Constitutional: Pt is oriented to person, place, and time. Appears well-developed and well-nourished, in no significant distress and comfortable Head: Normocephalic and atraumatic  Eyes: Conjunctivae and EOM are normal. Pupils are equal, round, and reactive to light Right Ear: External ear normal without discharge Left Ear: External ear normal without discharge Nose: Nose without discharge or deformity Mouth/Throat: Oropharynx is without other ulcerations and moist  Neck: Normal range of motion. Neck supple. No JVD present. No tracheal deviation present or significant neck LA or mass Cardiovascular: Normal rate, regular rhythm, normal heart sounds and intact distal pulses.   Pulmonary/Chest: WOB normal and breath sounds without rales or wheezing  Abdominal: Soft. Bowel sounds are normal. NT. No HSM  Musculoskeletal: Normal range of motion. Exhibits no edema, bilat knees with bony degenerative changes Right shoulder with diffuse mild tender and reduced ROM to forward elevation and abduction to 100 degrees only Lymphadenopathy: Has no other cervical adenopathy.  Neurological: Pt is alert and oriented to person, place, and time. Pt has normal reflexes. No cranial nerve deficit. Motor grossly  intact, Gait intact Skin: Skin is warm and dry. No rash noted or new ulcerations Psychiatric:  Has trace mild nervous mood and affect. Behavior is normal without agitation No other exam findings     Assessment & Plan:

## 2016-09-24 NOTE — Patient Instructions (Addendum)
Please make appt with Sports medicine in this office for shoulder and knee pain  Please continue all other medications as before, and refills have been done if requested.  Please have the pharmacy call with any other refills you may need.  Please continue your efforts at being more active, low cholesterol diet, and weight control.  You are otherwise up to date with prevention measures today.  Please keep your appointments with your specialists as you may have planned  Please go to the LAB in the Basement (turn left off the elevator) for the tests to be done today  You will be contacted by phone if any changes need to be made immediately.  Otherwise, you will receive a letter about your results with an explanation, but please check with MyChart first.  Please remember to sign up for MyChart if you have not done so, as this will be important to you in the future with finding out test results, communicating by private email, and scheduling acute appointments online when needed.  Please return in 6 months, or sooner if needed

## 2016-09-25 ENCOUNTER — Encounter: Payer: Self-pay | Admitting: Internal Medicine

## 2016-09-27 DIAGNOSIS — M25561 Pain in right knee: Secondary | ICD-10-CM | POA: Insufficient documentation

## 2016-09-27 DIAGNOSIS — M25562 Pain in left knee: Secondary | ICD-10-CM

## 2016-09-27 DIAGNOSIS — M25511 Pain in right shoulder: Secondary | ICD-10-CM | POA: Insufficient documentation

## 2016-09-27 NOTE — Assessment & Plan Note (Signed)
Lab Results  Component Value Date   HGBA1C 5.6 09/24/2016  stable overall by history and exam, recent data reviewed with pt, and pt to continue medical treatment as before,  to f/u any worsening symptoms or concerns

## 2016-09-27 NOTE — Assessment & Plan Note (Signed)
Improved, to cont the celexa

## 2016-09-27 NOTE — Assessment & Plan Note (Signed)
C/w possible rot cuff tear - for sport med followup, pt states will make appt

## 2016-09-27 NOTE — Assessment & Plan Note (Signed)

## 2016-09-27 NOTE — Assessment & Plan Note (Addendum)
Pt encouraged to f/u with sport med in this office for DJD  In addition to the time spent performing CPE, I spent an additional 15 minutes face to face,in which greater than 50% of this time was spent in counseling and coordination of care for patient's illness as documented, including the differential dx, and further evaluation and tx and other management of bilat knee pain, right shoulder pain, anxiety and hyperglycemia

## 2016-10-06 ENCOUNTER — Other Ambulatory Visit: Payer: Medicare Other

## 2016-11-03 ENCOUNTER — Ambulatory Visit: Payer: Medicare Other

## 2016-11-03 ENCOUNTER — Ambulatory Visit (INDEPENDENT_AMBULATORY_CARE_PROVIDER_SITE_OTHER): Payer: Medicare Other | Admitting: Sports Medicine

## 2016-11-03 VITALS — BP 145/56 | Ht 66.0 in | Wt 190.0 lb

## 2016-11-03 DIAGNOSIS — M1712 Unilateral primary osteoarthritis, left knee: Secondary | ICD-10-CM | POA: Diagnosis not present

## 2016-11-03 MED ORDER — METHYLPREDNISOLONE ACETATE 40 MG/ML IJ SUSP
40.0000 mg | Freq: Once | INTRAMUSCULAR | Status: AC
  Administered 2016-11-03: 40 mg via INTRA_ARTICULAR

## 2016-11-03 NOTE — Progress Notes (Signed)
   Subjective:    Patient ID: Doris Zamora, female    DOB: 16-Apr-1930, 83 y.o.   MRN: [id number]  HPI 83 yo Doris Zamora presents today for her ~3 month follow-up on her chronic left knee pain. She describes the pain as a soreness and rates it 4/10-7/10 pain. It is aggravated by distance walking, such as shopping. She does ambulate with a cane for assistance. Previous X-ray has shown decreased joint space at the medial aspect. She received a corticosteroid joint injection in April and again in July. She reports 90% relief with the injections and allows her to participate in group exercises twice a week. She is interested in continuing non-surgical approaches to relief of her knee pain.   Review of Systems Negative unless aforementioned in the HPI    Objective:   Physical Exam  Left Knee Standing: varus deformity noted in the L knee, mild medial joint line effusion Sitting: normal patellar alignment, no prepatellar, pes anserine, or infrapatellar bursitis  Supine: ROM mild hyperextension on extension on the knee, ~95 passive/active flexion, Hamasting tightness bilaterally, varus deformity L Pain on valgus stress, no pain with varus stress Meniscus: Negative bounce test, negative McMurray Cruciates: Negative Lachman, Drawer, Pivot shift  Patello-femoral: mild grinding noted on flexion/extension, tenderness over plica       Assessment & Plan:  Medial knee osteoarthritis Pt was advised to continue to participate in group exercise activities. I've agreed to reinject her left knee today but she is getting less and less benefit from cortisone injections. Next step in treatment would be Visco supplementation. I've asked her to call me when her knee pain starts to return so that I may refer her to either Dr. Tamala Julian or Dr.T for this. Otherwise, follow-up as needed.  Consent obtained and verified. Time-out conducted. Noted no overlying erythema, induration, or other signs of local infection. Skin  prepped in a sterile fashion. Topical analgesic spray: Ethyl chloride. Joint: Left Knee, anterior lateral approach Needle: 22 gauge 1 1/2"  Completed without difficulty. Meds: 3 cc 1% Xylocaine, 1 cc (40mg ) Depo-Medrol   Advised to call if fevers/chills, erythema, induration, drainage, or persistent bleeding.

## 2016-11-03 NOTE — Patient Instructions (Signed)
It was good to see you today.  Today you got another cortisone injection in your knee.  The next step is to try a different type of injection. It's called Visco supplementation. Call me when the cortisone is wearing off so that we can talk about how to set this up.  You can use ice as needed for pain.

## 2016-12-18 ENCOUNTER — Ambulatory Visit (INDEPENDENT_AMBULATORY_CARE_PROVIDER_SITE_OTHER): Payer: Medicare Other | Admitting: Sports Medicine

## 2016-12-18 VITALS — BP 136/70 | Ht 66.0 in | Wt 180.0 lb

## 2016-12-18 DIAGNOSIS — M1712 Unilateral primary osteoarthritis, left knee: Secondary | ICD-10-CM

## 2016-12-18 NOTE — Patient Instructions (Addendum)
Dr [facility] [address] [place] [phone] Tuesday 12/30/16 at 1145a for your left knee injection

## 2016-12-19 NOTE — Progress Notes (Signed)
   Subjective:    Patient ID: Doris Zamora, female    DOB: 1931/01/23, 83 y.o.   MRN: [id number]  HPI chief complaint: Left knee pain  Patient comes in today requesting a repeat cortisone injection into her left knee. Last injection was done in September and helped her for about 5 weeks. Cortisone injection prior to that was done in July. X-rays of the left knee in April of this year shows advanced medial compartmental DJD. Her pain is diffuse throughout the knee. She's not noticed any swelling. She denies any trauma. The knee does give way from time to time secondary to pain and weakness. She is here today with her daughter.   Review of Systems    as above Objective:   Physical Exam  Well-developed, well-nourished. No acute distress. Awake alert and oriented 3. Vital signs reviewed  Left knee: Range of motion is 0-120. No effusion. Bony hypertrophy along the medial knee consistent with DJD. Slight tenderness to palpation along the medial joint line. Pain but no popping with McMurray's. No palpable Baker's cyst. Knee is stable to valgus and varus stressing. Moderate quad weakness. Neurovascularly intact distally. Ambulating with the assistance of a cane.      Assessment & Plan:   Left knee pain secondary to advanced medial compartmental DJD  Patient has had two separate cortisone injections done over the past 4 months. Each injection provided her with a few weeks of symptom relief. We had previously discussed referral for Visco supplementation if the cortisone injection done in September was not long-lasting. Patient would like to go ahead with this. I will refer her to Dr. Charlann Boxer for this procedure. If she gets good symptom relief with Visco supplementation then repeat injections could be performed 6 months later. Follow-up with me as needed.

## 2016-12-29 NOTE — Progress Notes (Signed)
[facility] [address] [place], [place] [address] Phone: [phone] Subjective:    I'm seeing this patient by the request  of:  Draper DO  CC: Knee pain  [id number]  Doris Zamora is a 83 y.o. female coming in with complaint of left knee pain.  Has been seen another provider for quite some time.  Has had multiple injections for patient having severe medial compartment DJD.  Patient does have increasing instability, intermittent swelling, as well as with pain and weakness.  This is affected her daily activities and waking her up at night.  Patient states she has failed all conservative therapy including formal physical therapy as well. -     Past Medical History:  Diagnosis Date  . ANXIETY   . Endometrial cancer ([place]) 2007  . Hyperglycemia 04/20/2014  . HYPERLIPIDEMIA   . INSOMNIA-SLEEP DISORDER-UNSPEC   . WEIGHT LOSS    Past Surgical History:  Procedure Laterality Date  . ABDOMINAL HYSTERECTOMY  2007  . OOPHORECTOMY  2007  . THYROIDECTOMY, PARTIAL  1983   Social History   Socioeconomic History  . Marital status: Widowed    Spouse name: Not on file  . Number of children: Not on file  . Years of education: Not on file  . Highest education level: Not on file  Social Needs  . Financial resource strain: Not on file  . Food insecurity - worry: Not on file  . Food insecurity - inability: Not on file  . Transportation needs - medical: Not on file  . Transportation needs - non-medical: Not on file  Occupational History  . Not on file  Tobacco Use  . Smoking status: Former Smoker    Years: 50.00    Types: Cigarettes    Last attempt to quit: 02/10/1994    Years since quitting: 22.8  . Smokeless tobacco: Never Used  Substance and Sexual Activity  . Alcohol use: Yes    Alcohol/week: 4.2 oz    Types: 7 Glasses of wine per week    Comment: 2 0z. HS to help her sleep  . Drug use: No    Comment: admiited to poat use of marjuana to help her  sleep  . Sexual activity: Not on file  Other Topics Concern  . 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   Allergies  Allergen Reactions  . Synthroid [Levothyroxine Sodium] Swelling    Happened yrs ago  . Pneumovax [Pneumococcal Polysaccharide Vaccine] Other (See Comments)    Patient stated,"I don't know if I'm allergic to this or not. I'm 83 years old."   Family History  Problem Relation Age of Onset  . Diabetes Mother   . Muscular dystrophy Father   . Cancer Other 40       breast cancer   . Cancer Sister 19       pancreatic cancer      Past medical history, social, surgical and family history all reviewed in electronic medical record.  No pertanent information unless stated regarding to the chief complaint.   Review of Systems:Review of systems updated and as accurate as of 12/29/16  No headache, visual changes, nausea, vomiting, diarrhea, constipation, dizziness, abdominal pain, skin rash, fevers, chills, night sweats, weight loss, swollen lymph nodes, , chest pain, shortness of breath, mood changes.  Positive muscle aches, joint swelling  Objective  There were no vitals taken for  this visit. Systems examined below as of 12/29/16   General: No apparent distress alert and oriented x3 mood and affect normal, dressed appropriately.  HEENT: Pupils equal, extraocular movements intact  Respiratory: Patient's speak in full sentences and does not appear short of breath  Cardiovascular: No lower extremity edema, non tender, no erythema  Skin: Warm dry intact with no signs of infection or rash on extremities or on axial skeleton.  Abdomen: Soft nontender  Neuro: Cranial nerves II through XII are intact, neurovascularly intact in all extremities with 2+ DTRs and 2+ pulses.  Lymph: No lymphadenopathy of posterior or anterior cervical chain or axillae bilaterally.  Gait normal with good balance and coordination.  MSK:  Non tender  with full range of motion and good stability and symmetric strength and tone of shoulders, elbows, wrist, hip, and ankles bilaterally.  Knee: Left valgus deformity noted. Large thigh to calf ratio.  Tender to palpation over medial and PF joint line.  ROM full in flexion and extension and lower leg rotation. instability with valgus force.  painful patellar compression. Patellar glide with moderate crepitus. Patellar and quadriceps tendons unremarkable. Hamstring and quadriceps strength is normal. Contralateral knee shows arthritic changes but no instability  After informed written and verbal consent, patient was seated on exam table. Left knee was prepped with alcohol swab and utilizing anterolateral approach, patient's left knee space was injected with22mg /mL of monovisc (sodium hyaluronate) in a prefilled syringe was injected easily into the knee through a 22-gauge needle..Patient tolerated the procedure well without immediate complications.    Impression and Recommendations:     This case required medical decision making of moderate complexity.      Note: This dictation was prepared with Dragon dictation along with smaller phrase technology. Any transcriptional errors that result from this process are unintentional.

## 2016-12-30 ENCOUNTER — Ambulatory Visit (INDEPENDENT_AMBULATORY_CARE_PROVIDER_SITE_OTHER): Payer: Medicare Other | Admitting: Family Medicine

## 2016-12-30 ENCOUNTER — Encounter: Payer: Self-pay | Admitting: Family Medicine

## 2016-12-30 DIAGNOSIS — M1712 Unilateral primary osteoarthritis, left knee: Secondary | ICD-10-CM

## 2016-12-30 NOTE — Assessment & Plan Note (Signed)
Patient is failed all conservative therapy with the arthritis of the knee.  We discussed with patient in great length.  Patient did want to try the Visco supplementation.  Monovisc was given today.  Tolerated the procedure well.  Discussed that we can repeat every 6 months.  We discussed r custom brace secondary to patient's abnormal thigh to calf ratio.  Patient will consider this.  We discussed continuing icing regimen.  Follow-up again in 2 months

## 2016-12-30 NOTE — Patient Instructions (Addendum)
Good to see you.  Ice 20 minutes 2 times daily. Usually after activity and before bed. Stay active.  USe cane with a lot of walking Can always give you a custom brace if it worsens Over the counter try  Vitamin D 2000 IU daily  Tart cherry extract any dose at night Turmeric 500mg  daily  See me again in 2 months.  Happy holidays!

## 2016-12-31 NOTE — Progress Notes (Signed)
Doris Zamora  [phone] [phone]  Clinic Follow Up Note   Patient Care Team: Biagio Borg, MD as PCP - General (Internal Medicine) Eulis Manly. Gershon Crane, MD as Attending Physician (Ophthalmology) Autumn Messing III, MD as Consulting Physician (General Surgery) Truitt Merle, MD as Consulting Physician (Hematology) 01/05/2017  CHIEF COMPLAINT Follow-up breast cancer  Oncology History   Breast cancer, left breast   Staging form: Breast, AJCC 7th Edition     Clinical: T1c, N0 - Unsigned Endometrial cancer   Staging form: Corpus Uteri - Adenosarcoma, AJCC 7th Edition     Clinical: T1c, N0 - Unsigned       Endometrial cancer ([place])   08/12/2005 Initial Diagnosis    Endometrial cancer, T1bN0, s/p hysrectomy        Breast cancer, left breast ([place])   01/13/2014 Imaging    mammogram and US showed 3 masses at 6 o'clock, retroareolar and 2 o'clock area, measuring 0.7-1.7cm.       01/26/2014 Initial Diagnosis    Breast cancer, left breast, multifocal (3 lesions, biopsy showed 2 similar lesions with lobular features, and the third lesion has ductal features).       02/10/2014 -  Anti-estrogen oral therapy    Pt declined surgery, started anastrozole 1 mg once daily.       09/19/2014 Mammogram     mammogram and ultrasound showed interval decrease in the size and density of the 3 previous biopsy sites of malignancy in the left breast.      08/18/2016 Mammogram    IMPRESSION: Unchanged appearance of the left breast with 3 biopsy proven cancers. No evidence of malignancy in the right breast.      08/18/2016 Imaging    Bone Density 08/18/16 T score -1.4, indicating patient is osteopenic       HISTORY OF INITIAL PRESENTING ILLNESS (02/08/2014):  Doris Zamora 83 y.o. female is here because of left breast cancer.  She noticed left breast mass and nipple inversion about months ago. No tenderness or nipple discharge. She otherwise feels well. No change of appetite  and weight. ROS (+) indigestion but otherwise negative.  She underwent mammogram and [place] which showed 3 masses at the 6:00, retroareola, and 2:00. The masses measures 1-1.7 cm by mammogram, 0.7-0.9 cm by ultrasound. She underwent subsequent biopsy of this 3 lesions which showed invasive mammary carcinoma, 2 of them showed similar morphology and lobular features, one showed ductal features. Both are ER and PR positive.  She tolerated the biopsy well without complications. She feels well in general, denies any pain or new symptoms. No change in her appetite and weight recently. She was seen by breast surgeon Dr. Marlou Starks yesterday, who recommended mastectomy, and she declined upfront surgery.   CURRENT THERAPY: Anastrozole 1 mg once daily, started on 02/10/2014. Pt held anastrzole in 08/2016 and will restart 01/05/17.    INTERIM HISTORY:  Doris Zamora returns for follow-up. She presents to the clinic today accompanied by her daughter. She notes things are going well and has no complaints. She does have arthritis in her left knee still. She is getting gel shots and takes tumeric to help as well. She claims she was taken off of anastrozole at last viist. She stopped it 08/2016. She had no issues with the medication. She is fine to restart it. She does not check her breasts often. She notes she has been taking coq10 and fish oil for decades now. She is worried her cancer with spread with surgery instead  of curing it.     MEDICAL HISTORY:  Past Medical History:  Diagnosis Date  . ANXIETY   . Endometrial cancer ([place]) 2007  . Hyperglycemia 04/20/2014  . HYPERLIPIDEMIA   . INSOMNIA-SLEEP DISORDER-UNSPEC   . WEIGHT LOSS     SURGICAL HISTORY: Past Surgical History:  Procedure Laterality Date  . ABDOMINAL HYSTERECTOMY  2007  . OOPHORECTOMY  2007  . THYROIDECTOMY, PARTIAL  1983   GYN HISTORY  Menarchal: 14 LMP: age of 75 Contraceptive: 1 yr  HRT: no  G3P3:   SOCIAL HISTORY: History   Social History  .  Marital Status: Widowed    Spouse Name: N/A    Number of Children: 3  . Years of Education: N/A   Occupational History  . Not on file.   Social History Main Topics  . Smoking status: Former Smoker -- 50 years    Types: Cigarettes    Quit date: 02/10/1994  . Smokeless tobacco: Never Used  . Alcohol Use: 4.2 oz/week    7 Glasses of wine per week     Comment: 2 0z. HS to help her sleep  . Drug Use: No     Comment: admiited to poat use of marjuana to help her sleep  . Sexual Activity: Not on file   Other Topics Concern  . 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 Diabetes Mother   . Muscular dystrophy Father   . Cancer Other 40       breast cancer   . Cancer Sister 38       pancreatic cancer     ALLERGIES:  is allergic to synthroid [levothyroxine sodium] and pneumovax [pneumococcal polysaccharide vaccine].  MEDICATIONS:  Allergies as of 01/05/2017      Reactions   Synthroid [levothyroxine Sodium] Swelling   Happened yrs ago   Pneumovax [pneumococcal Polysaccharide Vaccine] Other (See Comments)   Patient stated,"I don't know if I'm allergic to this or not. I'm 83 years old."      Medication List        Accurate as of 01/05/17 11:17 PM. Always use your most recent med list.          ALPRAZolam 0.5 MG tablet Commonly known as:  XANAX Take 1 tablet (0.5 mg total) by mouth daily as needed.   anastrozole 1 MG tablet Commonly known as:  ARIMIDEX Take 1 tablet (1 mg total) by mouth daily.   cetirizine 10 MG tablet Commonly known as:  ZYRTEC Take 1 tablet (10 mg total) by mouth daily.   cholecalciferol 1000 units tablet Commonly known as:  VITAMIN D Take 1,000 Units by mouth daily.   co-enzyme Q-10 30 MG capsule Take 100 mg by mouth daily.   Fish Oil 1000 MG Cpdr Take 1 capsule by mouth daily.   fluticasone 50 MCG/ACT nasal  spray Commonly known as:  FLONASE Place 2 sprays into both nostrils daily.   multivitamin with minerals tablet Take 1 tablet by mouth daily. Forward Gold   OVER THE COUNTER MEDICATION Take 1 tablet by mouth at bedtime as needed. zzquil for sleep   OVER THE COUNTER MEDICATION Take 2 mLs by mouth daily. Tumeric with folic acid 562BW/389HT      REVIEW OF SYSTEMS:   Constitutional: Denies fevers, chills or abnormal night sweats, denies hot flashes  Eyes: Denies blurriness of vision, double vision or watery eyes Ears, nose, mouth, throat, and face: Denies mucositis or sore throat Respiratory: Denies cough, dyspnea or wheezes Cardiovascular: Denies palpitation, chest discomfort or lower extremity swelling Gastrointestinal:  Denies nausea, heartburn or change in bowel habits Skin: Denies abnormal skin rashes Lymphatics: Denies new lymphadenopathy or easy bruising MSK: (+) arthritis in left knee  Neurological:Denies numbness, tingling or new weaknesses Behavioral/Psych: Mood is stable, no new changes  All other systems were reviewed with the patient and are negative.  PHYSICAL EXAMINATION: ECOG PERFORMANCE STATUS: 1  Vitals:   01/05/17 1059  BP: (!) 131/47  Pulse: 63  Resp: 20  Temp: 97.6 F (36.4 C)  SpO2: 99%   Filed Weights   01/05/17 1059  Weight: 190 lb 4.8 oz (86.3 kg)    GENERAL:alert, no distress and comfortable SKIN: skin color, texture, turgor are normal, no rashes or significant lesions EYES: normal, conjunctiva are pink and non-injected, sclera clear OROPHARYNX:no exudate, no erythema and lips, buccal mucosa, and tongue normal  NECK: supple, thyroid normal size, non-tender, without nodularity LYMPH:  no palpable lymphadenopathy in the cervical, axillary or inguinal LUNGS: clear to auscultation and percussion with normal breathing effort HEART: regular rate & rhythm and no murmurs and no lower extremity edema ABDOMEN:abdomen soft, non-tender and normal bowel  sounds Musculoskeletal:no cyanosis of digits and no clubbing  PSYCH: alert & oriented x 3 with fluent speech NEURO: no focal motor/sensory deficits Breasts: Breast inspection showed them to be symmetrical, (+) Palpation of the right breast and bilateral  axilla revealed no obvious mass that I could appreciate. In the left breast there is a few small lumps in the inferior 6 o'clock position of the left breast, together measuring approximately now 2x2cm (previously 1.5 x 1 cm) with skin thicking, no ulcers or discharge.   LABORATORY DATA:  I have reviewed the data as listed CBC Latest Ref Rng & Units 01/05/2017 09/24/2016 08/29/2016  WBC 3.9 - 10.3 10e3/uL 4.6 5.4 5.8  Hemoglobin 11.6 - 15.9 g/dL 13.0 13.0 13.5  Hematocrit 34.8 - 46.6 % 39.8 39.7 41.0  Platelets 145 - 400 10e3/uL 196 248.0 214    CMP Latest Ref Rng & Units 01/05/2017 09/24/2016 08/29/2016  Glucose 70 - 140 mg/dl 91 101(H) 87  BUN 7.0 - 26.0 mg/dL 11.0 17 14.3  Creatinine 0.6 - 1.1 mg/dL 0.8 0.67 0.8  Sodium 136 - 145 mEq/L 139 137 139  Potassium 3.5 - 5.1 mEq/L 4.4 3.8 4.3  Chloride 96 - 112 mEq/L - 102 -  CO2 22 - 29 mEq/L 27 33(H) 27  Calcium 8.4 - 10.4 mg/dL 9.0 9.4 9.5  Total Protein 6.4 - 8.3 g/dL 8.0 7.6 8.3  Total Bilirubin 0.20 - 1.20 mg/dL 0.55 0.4 0.44  Alkaline Phos 40 - 150 U/L 73 97 111  AST 5 - 34 U/L 40(H) 84(H) 87(H)  ALT 0 - 55 U/L 36 96(H) 101(H)     PATHOLOGY REPORT Diagnosis 01/26/2014 1. Breast, left, needle core biopsy, mass, lower inner, 6:30 o'clock - INVASIVE MAMMARY CARCINOMA. - SEE COMMENT. 2. Breast, left, needle core biopsy, mass, retroareolar - INVASIVE MAMMARY CARCINOMA. - MAMMARY CARCINOMA IN SITU. - SEE COMMENT. 3. Breast, left, needle core biopsy, mass, 2:30 o'clock - INVASIVE MAMMARY CARCINOMA. - MAMMARY CARCINOMA IN SITU WITH CALCIFICATIONS. - SEE COMMENT. Microscopic Comment 1. The carcinoma is grade I and has some lobular features. 2. The carcinoma on part 2 is  morphologically identical to that in part 1.  3. The carcinoma appears grade II and is likely a ductal phenotype. Breast prognostic profile will be performed on parts 1 and 3 and the results reported separately. The results were called to [facility] on 01/27/14.  Estrogen Receptor: 100%, POSITIVE, STRONG STAINING INTENSITY Progesterone Receptor: 100%, POSITIVE, STRONG STAINING INTENSITY Proliferation Marker Ki67: 29%  Results: HER-2/NEU BY CISH - NO AMPLIFICATION OF HER-2 DETECTED. RESULT RATIO OF HER2: CEP 17 SIGNALS 0.94 AVERAGE HER2 COPY NUMBER PER CELL 1.65   RADIOGRAPHIC STUDIES: I have personally reviewed the radiological images as listed and agreed with the findings in the report.  Bone Density 08/18/16 T score -1.4, indicating patient is osteopenic  Bilateral Diagnostic Mammogram 08/18/16 IMPRESSION: Unchanged appearance of the left breast with 3 biopsy proven cancers. No evidence of malignancy in the right breast.  Bilateral diagnostic mammogram 02/19/2016 FINDINGS: 2D and 3D full field views of both breasts are performed.  The 2 spiculated masses within the lower left breast and retroareolar left breast are unchanged in appearance since the prior study. Left nipple inversion scratch de left nipple retraction is again identified.  Heterogeneous calcifications within the upper-outer left breast are unchanged and span a distance of 7.5 cm.  There are no findings of right breast malignancy.  Mammographic images were processed with CAD.  IMPRESSION: Unchanged appearance of 3 synchronous biopsy-proven left breast cancers. Follow-up as clinically indicated.  No mammographic evidence of right breast malignancy.  RECOMMENDATION: Follow-up mammograms as clinically indicated.   ASSESSMENT & PLAN:  83 y.o. female with past medical history of stage I endometrial cancer, status post hysterectomy, anxiety and insomnia, who was recently diagnosed  with left breast cancer.  1. Left breast cancer, 3 synchronized lesions, 2 of them with lobular features and one has ductal features. T1b-1cN0M0, stage I, ER+/PR+/HER2- -I previously reviewed her breast mass biopsy findings and image findings with patient and her daughter extensively. She has 3 synchronized early stage breast cancer without clinical lymph nodes involvement. I discussed that surgical resection, likely mastectomy due to the location of this 3 masses, would be the only curative treatment for her breast cancer. She previously declined surgery repeatly.  - She has been tolerating anastrozole very well, we'll continue indefinitely if no surgery.  -Her repeated mammogram and ultrasound showed partial response after 2 year of treatment, last mammogram on 08/18/2016 showed stable left breast lesions, no other new lesions. -I'll continue diagnostic mammogram and [place] every 6 months for monitoring. -She notes to stopping anastrozole in 08/2016 mistakenly. I will restart her today (01/05/17) and refill for her.  -She is clinically doing well, no adenopathy on physical exam, left breast mass has slightly increased to 2x2cm, lab results reviewed with her, no clinical signs or concerns for metastatic disease. -Labs reveiwed today, AST down to 40, CBC and other chemistries are WNL.  -She will get a bilateral breast mammogram and left Breast US of her mass in the next few weeks.  -I again discussed the option of surgery, and strongly encourage her to consider surgery. She is overall healthy for her age.  After lengthy discussion, she is open to surgery now.  I will refer her back to Dr. Marlou Starks after her imaging. -F/u in 2 months    2. Genetics -She has personal history of endometrial cancer, family history of breast cancer and pancreatic cancer. I previously discussed the genetic screening for inherited breast cancer and lynch syndrome, she declined at this point.  3. Arthritis  -She'll continue  follow-up with her primary care physician and orthopedic surgeon -She will continue to follow-up with her primary care physician for other medical issues. -She get gel shots in her left knee by her PCP and takes tumeric for her arthritis pain.   4. Osteopenia - DEXA scan on 08/18/16 showed T score -1.4, low risk osteopenia - The patient will begin to take calcium and vitamin D daily to support bone health.  5. Transaminitis  -Her lab today showed elevated liver enzymes. She had mild intermittent transaminitis in the past -will continue monitoring, repeat lab in 6 weeks, if gets worse, we'll hold anastrozole, or get a imaging study of her liver.  -AST reduced to 40 today (01/05/17) adequate to conitnue with anastrzole.   Plan -refill and restart anastrozole today  -Mammogram and [place] in 2-3 weeks -Lab and f/u in 2 months  -refer her back to Dr. Marlou Starks to discuss surgery   I spent 30 minutes face to face with the patient and more than 50% of that time was spent in counseling and/or coordination of care.  This document serves as a record of services personally performed by Truitt Merle, MD. It was created on her behalf by Joslyn Devon, a trained medical scribe. The creation of this record is based on the scribe's personal observations and the provider's statements to them.    I have reviewed the above documentation for accuracy and completeness, and I agree with the above.  Truitt Merle  01/05/2017

## 2017-01-05 ENCOUNTER — Ambulatory Visit (HOSPITAL_BASED_OUTPATIENT_CLINIC_OR_DEPARTMENT_OTHER): Payer: Medicare Other | Admitting: Hematology

## 2017-01-05 ENCOUNTER — Other Ambulatory Visit (HOSPITAL_BASED_OUTPATIENT_CLINIC_OR_DEPARTMENT_OTHER): Payer: Medicare Other

## 2017-01-05 ENCOUNTER — Telehealth: Payer: Self-pay | Admitting: Hematology

## 2017-01-05 VITALS — BP 131/47 | HR 63 | Temp 97.6°F | Resp 20 | Ht 66.0 in | Wt 190.3 lb

## 2017-01-05 DIAGNOSIS — C50412 Malignant neoplasm of upper-outer quadrant of left female breast: Secondary | ICD-10-CM | POA: Diagnosis not present

## 2017-01-05 DIAGNOSIS — C50112 Malignant neoplasm of central portion of left female breast: Secondary | ICD-10-CM

## 2017-01-05 DIAGNOSIS — C50812 Malignant neoplasm of overlapping sites of left female breast: Secondary | ICD-10-CM | POA: Diagnosis not present

## 2017-01-05 DIAGNOSIS — R740 Nonspecific elevation of levels of transaminase and lactic acid dehydrogenase [LDH]: Secondary | ICD-10-CM

## 2017-01-05 DIAGNOSIS — M810 Age-related osteoporosis without current pathological fracture: Secondary | ICD-10-CM | POA: Diagnosis not present

## 2017-01-05 DIAGNOSIS — C50912 Malignant neoplasm of unspecified site of left female breast: Secondary | ICD-10-CM

## 2017-01-05 DIAGNOSIS — C541 Malignant neoplasm of endometrium: Secondary | ICD-10-CM

## 2017-01-05 DIAGNOSIS — M1990 Unspecified osteoarthritis, unspecified site: Secondary | ICD-10-CM

## 2017-01-05 DIAGNOSIS — Z170 Estrogen receptor positive status [ER+]: Secondary | ICD-10-CM | POA: Diagnosis not present

## 2017-01-05 LAB — CBC WITH DIFFERENTIAL/PLATELET
BASO%: 0.9 % (ref 0.0–2.0)
BASOS ABS: 0 10*3/uL (ref 0.0–0.1)
EOS ABS: 0 10*3/uL (ref 0.0–0.5)
EOS%: 0.9 % (ref 0.0–7.0)
HEMATOCRIT: 39.8 % (ref 34.8–46.6)
HGB: 13 g/dL (ref 11.6–15.9)
LYMPH#: 1.9 10*3/uL (ref 0.9–3.3)
LYMPH%: 42.1 % (ref 14.0–49.7)
MCH: 30.5 pg (ref 25.1–34.0)
MCHC: 32.7 g/dL (ref 31.5–36.0)
MCV: 93.4 fL (ref 79.5–101.0)
MONO#: 0.6 10*3/uL (ref 0.1–0.9)
MONO%: 13.1 % (ref 0.0–14.0)
NEUT#: 2 10*3/uL (ref 1.5–6.5)
NEUT%: 43 % (ref 38.4–76.8)
PLATELETS: 196 10*3/uL (ref 145–400)
RBC: 4.26 10*6/uL (ref 3.70–5.45)
RDW: 14.4 % (ref 11.2–14.5)
WBC: 4.6 10*3/uL (ref 3.9–10.3)

## 2017-01-05 LAB — COMPREHENSIVE METABOLIC PANEL
ALK PHOS: 73 U/L (ref 40–150)
ALT: 36 U/L (ref 0–55)
ANION GAP: 7 meq/L (ref 3–11)
AST: 40 U/L — ABNORMAL HIGH (ref 5–34)
Albumin: 3.3 g/dL — ABNORMAL LOW (ref 3.5–5.0)
BILIRUBIN TOTAL: 0.55 mg/dL (ref 0.20–1.20)
BUN: 11 mg/dL (ref 7.0–26.0)
CALCIUM: 9 mg/dL (ref 8.4–10.4)
CHLORIDE: 105 meq/L (ref 98–109)
CO2: 27 mEq/L (ref 22–29)
CREATININE: 0.8 mg/dL (ref 0.6–1.1)
Glucose: 91 mg/dl (ref 70–140)
Potassium: 4.4 mEq/L (ref 3.5–5.1)
Sodium: 139 mEq/L (ref 136–145)
Total Protein: 8 g/dL (ref 6.4–8.3)

## 2017-01-05 MED ORDER — ANASTROZOLE 1 MG PO TABS: 1.0000 mg | ORAL_TABLET | Freq: Every day | ORAL | 5 refills | Status: DC

## 2017-01-05 NOTE — Telephone Encounter (Signed)
Scheduled appt per 11/26 los - Gave patient AVS and calender per los 

## 2017-01-06 ENCOUNTER — Encounter: Payer: Self-pay | Admitting: Hematology

## 2017-01-26 ENCOUNTER — Other Ambulatory Visit: Payer: Medicare Other

## 2017-02-16 ENCOUNTER — Ambulatory Visit
Admission: RE | Admit: 2017-02-16 | Discharge: 2017-02-16 | Disposition: A | Discharge status: Home / Self Care | Payer: Medicare Other | Source: Ambulatory Visit | Attending: Hematology | Admitting: Hematology

## 2017-02-16 DIAGNOSIS — Z170 Estrogen receptor positive status [ER+]: Principal | ICD-10-CM

## 2017-02-16 DIAGNOSIS — C50912 Malignant neoplasm of unspecified site of left female breast: Secondary | ICD-10-CM

## 2017-02-19 ENCOUNTER — Encounter: Payer: Self-pay | Admitting: Podiatry

## 2017-02-19 ENCOUNTER — Ambulatory Visit (INDEPENDENT_AMBULATORY_CARE_PROVIDER_SITE_OTHER): Payer: Medicare Other | Admitting: Podiatry

## 2017-02-19 DIAGNOSIS — M79609 Pain in unspecified limb: Principal | ICD-10-CM

## 2017-02-19 DIAGNOSIS — M79676 Pain in unspecified toe(s): Secondary | ICD-10-CM | POA: Diagnosis not present

## 2017-02-19 DIAGNOSIS — B351 Tinea unguium: Secondary | ICD-10-CM | POA: Diagnosis not present

## 2017-02-19 NOTE — Progress Notes (Signed)
   Subjective:    Patient ID: Doris Zamora, female    DOB: 08-07-1930, 83 y.o.   MRN: [id number]  HPI  Chief Complaint  Patient presents with  . Toe Pain    est pt last seen 07/2013/ great toenail thick and painful in shoes / discolored-        Review of Systems  All other systems reviewed and are negative.      Objective:   Physical Exam        Assessment & Plan:

## 2017-03-05 NOTE — Progress Notes (Signed)
[name]  [phone] [phone]  Clinic Follow Up Note   Patient Care Team: Biagio Borg, MD as PCP - General (Internal Medicine) Eulis Manly. Gershon Crane, MD as Attending Physician (Ophthalmology) Autumn Messing III, MD as Consulting Physician (General Surgery) Truitt Merle, MD as Consulting Physician (Hematology) 03/06/2017  CHIEF COMPLAINT Follow-up breast cancer  Oncology History   Breast cancer, left breast   Staging form: Breast, AJCC 7th Edition     Clinical: T1c, N0 - Unsigned Endometrial cancer   Staging form: Corpus Uteri - Adenosarcoma, AJCC 7th Edition     Clinical: T1c, N0 - Unsigned       Endometrial cancer ([place])   08/12/2005 Initial Diagnosis    Endometrial cancer, T1bN0, s/p hysrectomy        Breast cancer, left breast ([place])   01/13/2014 Imaging    mammogram and US showed 3 masses at 6 o'clock, retroareolar and 2 o'clock area, measuring 0.7-1.7cm.       01/26/2014 Initial Diagnosis    Breast cancer, left breast, multifocal (3 lesions, biopsy showed 2 similar lesions with lobular features, and the third lesion has ductal features).       02/10/2014 -  Anti-estrogen oral therapy    Pt declined surgery, started anastrozole 1 mg once daily.       09/19/2014 Mammogram     mammogram and ultrasound showed interval decrease in the size and density of the 3 previous biopsy sites of malignancy in the left breast.      08/18/2016 Mammogram    IMPRESSION: Unchanged appearance of the left breast with 3 biopsy proven cancers. No evidence of malignancy in the right breast.      08/18/2016 Imaging    Bone Density 08/18/16 T score -1.4, indicating patient is osteopenic      02/16/2017 Mammogram    Diagnostic bilateral mammogram with tomography: IMPRESSION: No significant interval change in the appearance of the 3 sites of biopsy proven cancer in the left breast. No evidence of right breast malignancy.       HISTORY OF INITIAL PRESENTING ILLNESS  (02/08/2014):  Doris Zamora 84 y.o. female is here because of left breast cancer.  She noticed left breast mass and nipple inversion about months ago. No tenderness or nipple discharge. She otherwise feels well. No change of appetite and weight. ROS (+) indigestion but otherwise negative.  She underwent mammogram and [place] which showed 3 masses at the 6:00, retroareola, and 2:00. The masses measures 1-1.7 cm by mammogram, 0.7-0.9 cm by ultrasound. She underwent subsequent biopsy of this 3 lesions which showed invasive mammary carcinoma, 2 of them showed similar morphology and lobular features, one showed ductal features. Both are ER and PR positive.  She tolerated the biopsy well without complications. She feels well in general, denies any pain or new symptoms. No change in her appetite and weight recently. She was seen by breast surgeon Dr. Marlou Starks yesterday, who recommended mastectomy, and she declined upfront surgery.   CURRENT THERAPY: Anastrozole 1 mg once daily, started on 02/10/2014. Pt held anastrzole in 08/2016 and restarted 01/05/17.    INTERIM HISTORY:  Doris Zamora returns for follow-up. She presents to the clinic today accompanied by her daughter. She notes things are going well and has no complaints. She notes that she is continuing on anastrozole without any side effects. Daughter reports that the patient would like to be followed up with Dr. Barry Dienes. Patient denies having cardiac issues.    Since her last visit to  the office, she underwent a diagnostic mammogram on 02/16/17 showed: No significant interval change in the appearance of the 3 sites of biopsy proven cancer in the left breast. No evidence of right breast malignancy.  On review of systems, she reports arthritic left knee pain (pt was recently treated with a gel injection in December). Daughter reports that the patient has been set up for another injection in the near future. She denies back pain and any other symptoms.       MEDICAL  HISTORY:  Past Medical History:  Diagnosis Date  . ANXIETY   . Endometrial cancer ([place]) 2007  . Hyperglycemia 04/20/2014  . HYPERLIPIDEMIA   . INSOMNIA-SLEEP DISORDER-UNSPEC   . WEIGHT LOSS     SURGICAL HISTORY: Past Surgical History:  Procedure Laterality Date  . ABDOMINAL HYSTERECTOMY  2007  . BREAST BIOPSY Left 01/26/2014   x3, malignant  . OOPHORECTOMY  2007  . THYROIDECTOMY, PARTIAL  1983   GYN HISTORY  Menarchal: 14 LMP: age of 48 Contraceptive: 1 yr  HRT: no  G3P3:   SOCIAL HISTORY: History   Social History  . Marital Status: Widowed    Spouse Name: N/A    Number of Children: 3  . Years of Education: N/A   Occupational History  . Not on file.   Social History Main Topics  . Smoking status: Former Smoker -- 50 years    Types: Cigarettes    Quit date: 02/10/1994  . Smokeless tobacco: Never Used  . Alcohol Use: 4.2 oz/week    7 Glasses of wine per week     Comment: 2 0z. HS to help her sleep  . Drug Use: No     Comment: admiited to poat use of marjuana to help her sleep  . Sexual Activity: Not on file   Other Topics Concern  . 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 Diabetes Mother   . Muscular dystrophy Father   . Cancer Other 40       breast cancer   . Cancer Sister 7       pancreatic cancer     ALLERGIES:  is allergic to synthroid [levothyroxine sodium] and pneumovax [pneumococcal polysaccharide vaccine].  MEDICATIONS:  Allergies as of 03/06/2017      Reactions   Synthroid [levothyroxine Sodium] Swelling   Happened yrs ago   Pneumovax [pneumococcal Polysaccharide Vaccine] Other (See Comments)   Patient stated,"I don't know if I'm allergic to this or not. I'm 84 years old."      Medication List        Accurate as of 03/06/17 11:58 AM. Always use your most recent med list.          ALPRAZolam 0.5 MG  tablet Commonly known as:  XANAX Take 1 tablet (0.5 mg total) by mouth daily as needed.   anastrozole 1 MG tablet Commonly known as:  ARIMIDEX Take 1 tablet (1 mg total) by mouth daily.   cetirizine 10 MG tablet Commonly known as:  ZYRTEC Take 1 tablet (10 mg total) by mouth daily.   cholecalciferol 1000 units tablet Commonly known as:  VITAMIN D Take 1,000 Units by mouth daily.   co-enzyme Q-10 30 MG capsule Take 100 mg by mouth daily.   Fish Oil 1000 MG Cpdr Take 1 capsule by mouth daily.  fluticasone 50 MCG/ACT nasal spray Commonly known as:  FLONASE Place 2 sprays into both nostrils daily.   multivitamin with minerals tablet Take 1 tablet by mouth daily. Forward Gold   OVER THE COUNTER MEDICATION Take 1 tablet by mouth at bedtime as needed. zzquil for sleep   OVER THE COUNTER MEDICATION Take 2 mLs by mouth daily. Tumeric with folic acid 110RP/594VO      REVIEW OF SYSTEMS:   Constitutional: Denies fevers, chills or abnormal night sweats, denies hot flashes Eyes: Denies blurriness of vision, double vision or watery eyes Ears, nose, mouth, throat, and face: Denies mucositis or sore throat Respiratory: Denies cough, dyspnea or wheezes Cardiovascular: Denies palpitation, chest discomfort or lower extremity swelling Gastrointestinal:  Denies nausea, heartburn or change in bowel habits Skin: Denies abnormal skin rashes Lymphatics: Denies new lymphadenopathy or easy bruising MSK: (+) arthritis in left knee  Neurological:Denies numbness, tingling or new weaknesses Behavioral/Psych: Mood is stable, no new changes  All other systems were reviewed with the patient and are negative.  PHYSICAL EXAMINATION:  ECOG PERFORMANCE STATUS: 1  Vitals:   03/06/17 1042  BP: (!) 148/52  Pulse: 64  Resp: 16  Temp: 97.7 F (36.5 C)  SpO2: 100%   Filed Weights   03/06/17 1042  Weight: 190 lb 3.2 oz (86.3 kg)    GENERAL:alert, no distress and comfortable SKIN: skin color,  texture, turgor are normal, no rashes or significant lesions EYES: normal, conjunctiva are pink and non-injected, sclera clear OROPHARYNX:no exudate, no erythema and lips, buccal mucosa, and tongue normal  NECK: supple, thyroid normal size, non-tender, without nodularity LYMPH:  no palpable lymphadenopathy in the cervical, axillary or inguinal LUNGS: clear to auscultation and percussion with normal breathing effort HEART: regular rate & rhythm and no murmurs and no lower extremity edema ABDOMEN:abdomen soft, non-tender and normal bowel sounds Musculoskeletal:no cyanosis of digits and no clubbing  PSYCH: alert & oriented x 3 with fluent speech NEURO: no focal motor/sensory deficits Breasts: Breast inspection showed them to be symmetrical, (+) Palpation of the right breast and bilateral  axilla revealed no obvious mass that I could appreciate. In the left breast there is a few small lumps in the inferior 6 o'clock position of the left breast below the nipple, together measuring approximately now a x 1.5 cm (previously 2 x 2 cm) with skin thickening and mild skin retraction , no ulcers or discharge.    LABORATORY DATA:  I have reviewed the data as listed CBC Latest Ref Rng & Units 03/06/2017 01/05/2017 09/24/2016  WBC 3.9 - 10.3 K/uL 5.6 4.6 5.4  Hemoglobin 11.6 - 15.9 g/dL 12.9 13.0 13.0  Hematocrit 34.8 - 46.6 % 39.6 39.8 39.7  Platelets 145 - 400 K/uL 215 196 248.0    CMP Latest Ref Rng & Units 03/06/2017 01/05/2017 09/24/2016  Glucose 70 - 140 mg/dL 90 91 101(H)  BUN 7 - 26 mg/dL 12 11.0 17  Creatinine 0.60 - 1.10 mg/dL 0.79 0.8 0.67  Sodium 136 - 145 mmol/L 138 139 137  Potassium 3.3 - 4.7 mmol/L 4.3 4.4 3.8  Chloride 98 - 109 mmol/L 103 - 102  CO2 22 - 29 mmol/L 29 27 33(H)  Calcium 8.4 - 10.4 mg/dL 9.0 9.0 9.4  Total Protein 6.4 - 8.3 g/dL 7.9 8.0 7.6  Total Bilirubin 0.2 - 1.2 mg/dL 0.3 0.55 0.4  Alkaline Phos 40 - 150 U/L 104 73 97  AST 5 - 34 U/L 55(H) 40(H) 84(H)  ALT 0 - 55  U/L 49 36 96(H)     PATHOLOGY REPORT Diagnosis 01/26/2014 1. Breast, left, needle core biopsy, mass, lower inner, 6:30 o'clock - INVASIVE MAMMARY CARCINOMA. - SEE COMMENT. 2. Breast, left, needle core biopsy, mass, retroareolar - INVASIVE MAMMARY CARCINOMA. - MAMMARY CARCINOMA IN SITU. - SEE COMMENT. 3. Breast, left, needle core biopsy, mass, 2:30 o'clock - INVASIVE MAMMARY CARCINOMA. - MAMMARY CARCINOMA IN SITU WITH CALCIFICATIONS. - SEE COMMENT. Microscopic Comment 1. The carcinoma is grade I and has some lobular features. 2. The carcinoma on part 2 is morphologically identical to that in part 1. 3. The carcinoma appears grade II and is likely a ductal phenotype. Breast prognostic profile will be performed on parts 1 and 3 and the results reported separately. The results were called to [facility] on 01/27/14.  Estrogen Receptor: 100%, POSITIVE, STRONG STAINING INTENSITY Progesterone Receptor: 100%, POSITIVE, STRONG STAINING INTENSITY Proliferation Marker Ki67: 29%  Results: HER-2/NEU BY CISH - NO AMPLIFICATION OF HER-2 DETECTED. RESULT RATIO OF HER2: CEP 17 SIGNALS 0.94 AVERAGE HER2 COPY NUMBER PER CELL 1.65   RADIOGRAPHIC STUDIES: I have personally reviewed the radiological images as listed and agreed with the findings in the report.  Mammogram diagnostic bilateral breast, 02/16/17 IMPRESSION: No significant interval change in the appearance of the 3 sites of biopsy proven cancer in the left breast. No evidence of right breast malignancy.   Bone Density 08/18/16 T score -1.4, indicating patient is osteopenic  Bilateral Diagnostic Mammogram 08/18/16 IMPRESSION: Unchanged appearance of the left breast with 3 biopsy proven cancers. No evidence of malignancy in the right breast.  Bilateral diagnostic mammogram 02/19/2016 FINDINGS: 2D and 3D full field views of both breasts are performed.  The 2 spiculated masses within the lower left breast  and retroareolar left breast are unchanged in appearance since the prior study. Left nipple inversion scratch de left nipple retraction is again identified.  Heterogeneous calcifications within the upper-outer left breast are unchanged and span a distance of 7.5 cm.  There are no findings of right breast malignancy.  Mammographic images were processed with CAD.  IMPRESSION: Unchanged appearance of 3 synchronous biopsy-proven left breast cancers. Follow-up as clinically indicated.  No mammographic evidence of right breast malignancy.  RECOMMENDATION: Follow-up mammograms as clinically indicated.   ASSESSMENT & PLAN:  84 y.o. female with past medical history of stage I endometrial cancer, status post hysterectomy, anxiety and insomnia, who was recently diagnosed with left breast cancer.  1. Left breast cancer, 3 synchronized lesions, 2 of them with lobular features and one has ductal features. T1b-1cN0M0, stage I, ER+/PR+/HER2- -I previously reviewed her breast mass biopsy findings and image findings with patient and her daughter extensively. She has 3 synchronized early stage breast cancer without clinical lymph nodes involvement. I discussed that surgical resection, likely mastectomy due to the location of this 3 masses, would be the only curative treatment for her breast cancer. She previously declined surgery repeatly.  - She has been tolerating anastrozole very well, we'll continue indefinitely if no surgery.  -Her repeated mammogram and ultrasound showed partial response after 2 year of treatment -I'll continue diagnostic mammogram and [place] every 6 months for monitoring. -She notes stopping anastrozole in 08/2016 mistakenly and restarted on 01/05/17 and has been complaint since then  -She is clinically doing well, no adenopathy on physical exam, left breast mass has slightly decreased again since she restarted anastrozole -Mammogram on 02/16/17 showed: No significant interval  change in the appearance of the 3 sites of  biopsy proven cancer in the left breast. No evidence of right breast malignancy. -I again discussed the option of surgery, and strongly encourage her to consider surgery. She is overall healthy for her age.  After lengthy discussion, she is open to surgery now.  Her daughter knows Dr. Barry Dienes, who has operated on elderly she knows well.  So daughter and the patient wants switch her surgeon to Dr. Barry Dienes.  I send a referral to Dr. Barry Dienes. -Labs reviewed today, CBC and CMP are within normal limits. -Lab and f/u in 3 months, continue anastrozole.   2. Genetics -She has personal history of endometrial cancer, family history of breast cancer and pancreatic cancer. I previously discussed the genetic screening for inherited breast cancer and lynch syndrome, she declined at this point.   3. Arthritis  -She'll continue follow-up with her primary care physician and orthopedic surgeon -She will continue to follow-up with her primary care physician for other medical issues. -She get gel shots in her left knee by her PCP and takes tumeric for her arthritis pain.   4. Osteopenia - DEXA scan on 08/18/16 showed T score -1.4, low risk osteopenia - The patient will begin to take calcium and vitamin D daily to support bone health.  5. Transaminitis  -Her lab today showed elevated liver enzymes. She had mild intermittent transaminitis in the past -will continue monitoring, repeat lab in 6 weeks, if gets worse, we'll hold anastrozole, or get a imaging study of her liver.  -AST reduced to 40 today (01/05/17) adequate to conitnue with anastrzole.  -Stable, ALT normal, AST mildly elevated today  Plan Lab and f/u in 3 months  -refer her to Dr. Barry Dienes to discuss surgery.  -Continue anastrozole  I spent 20 minutes face to face with the patient and a total of 25 mins spent in counseling and/or coordination of care.  This document serves as a record of services personally  performed by Truitt Merle, MD. It was created on her behalf by Steva Colder, a trained medical scribe. The creation of this record is based on the scribe's personal observations and the provider's statements to them.   I have reviewed the above documentation for accuracy and completeness, and I agree with the above.    Truitt Merle  03/06/2017

## 2017-03-06 ENCOUNTER — Inpatient Hospital Stay: Payer: Medicare Other

## 2017-03-06 ENCOUNTER — Telehealth: Payer: Self-pay | Admitting: Hematology

## 2017-03-06 ENCOUNTER — Inpatient Hospital Stay: Payer: Medicare Other | Attending: Hematology | Admitting: Hematology

## 2017-03-06 ENCOUNTER — Encounter: Payer: Self-pay | Admitting: Hematology

## 2017-03-06 ENCOUNTER — Telehealth: Payer: Self-pay | Admitting: *Deleted

## 2017-03-06 VITALS — BP 148/52 | HR 64 | Temp 97.7°F | Resp 16 | Ht 66.0 in | Wt 190.2 lb

## 2017-03-06 DIAGNOSIS — M8580 Other specified disorders of bone density and structure, unspecified site: Secondary | ICD-10-CM | POA: Insufficient documentation

## 2017-03-06 DIAGNOSIS — Z8541 Personal history of malignant neoplasm of cervix uteri: Secondary | ICD-10-CM | POA: Diagnosis not present

## 2017-03-06 DIAGNOSIS — C50912 Malignant neoplasm of unspecified site of left female breast: Secondary | ICD-10-CM

## 2017-03-06 DIAGNOSIS — Z170 Estrogen receptor positive status [ER+]: Secondary | ICD-10-CM

## 2017-03-06 LAB — CBC WITH DIFFERENTIAL/PLATELET
BASOS PCT: 1 %
Basophils Absolute: 0 10*3/uL (ref 0.0–0.1)
EOS ABS: 0 10*3/uL (ref 0.0–0.5)
Eosinophils Relative: 1 %
HCT: 39.6 % (ref 34.8–46.6)
Hemoglobin: 12.9 g/dL (ref 11.6–15.9)
Lymphocytes Relative: 29 %
Lymphs Abs: 1.6 10*3/uL (ref 0.9–3.3)
MCH: 30.4 pg (ref 25.1–34.0)
MCHC: 32.5 g/dL (ref 31.5–36.0)
MCV: 93.5 fL (ref 79.5–101.0)
MONO ABS: 0.7 10*3/uL (ref 0.1–0.9)
MONOS PCT: 12 %
NEUTROS PCT: 57 %
Neutro Abs: 3.2 10*3/uL (ref 1.5–6.5)
PLATELETS: 215 10*3/uL (ref 145–400)
RBC: 4.23 MIL/uL (ref 3.70–5.45)
RDW: 13.4 % (ref 11.2–16.1)
WBC: 5.6 10*3/uL (ref 3.9–10.3)

## 2017-03-06 LAB — COMPREHENSIVE METABOLIC PANEL
ALBUMIN: 3.2 g/dL — AB (ref 3.5–5.0)
ALT: 49 U/L (ref 0–55)
ANION GAP: 6 (ref 3–11)
AST: 55 U/L — ABNORMAL HIGH (ref 5–34)
Alkaline Phosphatase: 104 U/L (ref 40–150)
BUN: 12 mg/dL (ref 7–26)
CO2: 29 mmol/L (ref 22–29)
Calcium: 9 mg/dL (ref 8.4–10.4)
Chloride: 103 mmol/L (ref 98–109)
Creatinine, Ser: 0.79 mg/dL (ref 0.60–1.10)
GFR calc Af Amer: >60 mL/min (ref >60)
GFR calc non Af Amer: >60 mL/min (ref >60)
GLUCOSE: 90 mg/dL (ref 70–140)
POTASSIUM: 4.3 mmol/L (ref 3.3–4.7)
SODIUM: 138 mmol/L (ref 136–145)
Total Bilirubin: 0.3 mg/dL (ref 0.2–1.2)
Total Protein: 7.9 g/dL (ref 6.4–8.3)

## 2017-03-06 NOTE — Telephone Encounter (Signed)
Gave avs and calendar for april °

## 2017-03-06 NOTE — Telephone Encounter (Signed)
Called pt's daughter  & informed of possibility of Dr Marlowe Aschoff office to call for appt on Monday.  She appreciated call.

## 2017-03-06 NOTE — Telephone Encounter (Signed)
-----   Message from Truitt Merle, MD sent at 03/06/2017  3:08 PM EST ----- Janifer Adie,   Please call pt's daughter, that Dr. Marlowe Aschoff office will call her and likely see her on Monday.  Thanks  Krista Blue ----- Message ----- From: Stark Klein, MD Sent: 03/06/2017  12:28 PM To: Jovita Kussmaul, MD, April A Staton, #  Can I get her in on Monday? tx FB  ----- Message ----- From: Truitt Merle, MD Sent: 03/06/2017  11:08 AM To: Stark Klein, MD, Autumn Messing III, MD  Dorris Fetch,  This is an elderly lady with multifocal left breast cancer, initially seen by Dr. Marlou Starks and declined surgery. She is now open to surgery, you took care of an elderly lady whom her daughter knows wekk, so she wants to see you now. Could you see her?  Thanks  Krista Blue

## 2017-03-09 ENCOUNTER — Ambulatory Visit (INDEPENDENT_AMBULATORY_CARE_PROVIDER_SITE_OTHER): Payer: Medicare Other | Admitting: Sports Medicine

## 2017-03-09 VITALS — BP 108/80 | Ht 66.0 in | Wt 190.0 lb

## 2017-03-09 DIAGNOSIS — M1712 Unilateral primary osteoarthritis, left knee: Secondary | ICD-10-CM | POA: Diagnosis not present

## 2017-03-09 NOTE — Progress Notes (Signed)
   CC: For follow-up of her left knee pain.  HPI:  [name] is a 84 y.o. came today to follow-up of her left knee pain. She did get Visco supplement injection which relieved her symptoms for 3-4 weeks. She continued to experience mild discomfort while walking around, although being able to do all her ADLs which include going up and down the stairs.  She uses a cane for walking.  She was accompanied by her daughter today. According to patient she recently started trying Insta Flex which help with her symptoms, she also found traumatic supplement helpful. She is not interested in knee replacement surgery.  Past Medical History:  Diagnosis Date  . ANXIETY   . Endometrial cancer ([place]) 2007  . Hyperglycemia 04/20/2014  . HYPERLIPIDEMIA   . INSOMNIA-SLEEP DISORDER-UNSPEC   . WEIGHT LOSS    Review of Systems: Negative except mentioned in HPI.  Physical Exam:  Vitals:   03/09/17 1505  BP: 108/80  Weight: 190 lb (86.2 kg)  Height: 5\' 6"  (1.676 m)   Gen. well-developed, well-nourished, pleasant elderly lady, in no acute distress. Musculoskeletal.  No edema or erythema of left knee.  Mild to moderate varus deformity.  Good range of motion.  Assessment & Plan:   Osteoarthritis of left knee.  Her symptoms improved for 3-4 weeks with Visco supplementation.  No need to repeat that injection as improvement does not last as anticipated. Currently she is having very mild discomfort and being able to ambulate with cane.  She has been able to go up and down on stairs without much difficulty. She can continue Insta Flex supplement as it is helping with her symptoms. She can continue turmeric supplement. She was provided with a new knee compression sleeve. We can inject steroid as needed, but it should not be sooner than every 59-month.  Lorella Nimrod MD PGY2  Patient seen and evaluated with the resident. I agree with the above plan of care. Had a long talk with the patient and her  daughter about treatment going forward. She seems to be doing well on her Insta- Flex which is an over-the-counter supplement. She will continue with this for now. I did look at the ingredients and it appears to be safe. I've also given her a new knee compression sleeve to wear when active. I explained to her that we will want to wait and use cortisone injections only as needed. She also understands that we will not want to do them any sooner than every 3 months. Follow-up as needed.

## 2017-03-20 ENCOUNTER — Other Ambulatory Visit: Payer: Self-pay | Admitting: General Surgery

## 2017-03-20 DIAGNOSIS — C50812 Malignant neoplasm of overlapping sites of left female breast: Secondary | ICD-10-CM

## 2017-03-25 ENCOUNTER — Ambulatory Visit
Admission: RE | Admit: 2017-03-25 | Discharge: 2017-03-25 | Disposition: A | Discharge status: Home / Self Care | Payer: Medicare Other | Source: Ambulatory Visit | Attending: General Surgery | Admitting: General Surgery

## 2017-03-25 DIAGNOSIS — C50812 Malignant neoplasm of overlapping sites of left female breast: Secondary | ICD-10-CM

## 2017-04-02 ENCOUNTER — Ambulatory Visit
Admission: RE | Admit: 2017-04-02 | Discharge: 2017-04-02 | Disposition: A | Discharge status: Home / Self Care | Payer: Medicare Other | Source: Ambulatory Visit | Attending: General Surgery | Admitting: General Surgery

## 2017-04-02 MED ORDER — GADOBENATE DIMEGLUMINE 529 MG/ML IV SOLN
18.0000 mL | Freq: Once | INTRAVENOUS | Status: AC | PRN
  Administered 2017-04-02: 18 mL via INTRAVENOUS

## 2017-04-07 ENCOUNTER — Other Ambulatory Visit: Payer: Self-pay | Admitting: Internal Medicine

## 2017-04-07 MED ORDER — ALPRAZOLAM 0.5 MG PO TABS: 0.5000 mg | ORAL_TABLET | Freq: Every day | ORAL | 5 refills | Status: DC | PRN

## 2017-04-07 NOTE — Telephone Encounter (Signed)
Copied from [place]. Topic: Quick Communication - Rx Refill/Question >> Apr 07, 2017  9:46 AM Clack, Janett Billow D wrote: Medication: ALPRAZolam Duanne Moron) 0.5 MG tablet [[id number]]    Has the patient contacted their pharmacy? Yes.     (Agent: If no, request that the patient contact the pharmacy for the refill.)   Preferred Pharmacy (with phone number or street name): [facility] [phone] - [place], [address] AT [address] [phone] (Phone) [phone] (Fax)   [place] pt daug states b/c [facility] has switched over to [facility] they need the pt last Rx for xanax sent over.  Agent: Please be advised that RX refills may take up to 3 business days. We ask that you follow-up with your pharmacy.

## 2017-04-07 NOTE — Telephone Encounter (Signed)
Xanax refill request - controlled substance.   [facility] has switched to [facility].   They need the last prescription for Xanax sent over.  LOV 09/24/16  Dr. Minerva Fester [place], [place] [facility].

## 2017-04-07 NOTE — Progress Notes (Signed)
  Subjective:  Patient ID: Doris Zamora, female    DOB: 1930-10-19,  MRN: [id number]  Chief Complaint  Patient presents with  . Toe Pain    est pt last seen 07/2013/ great toenail thick and painful in shoes / discolored-    84 y.o. female presents with the above complaint.  Reports that her right great toenail is painful and thick.  States that her shoes.  Denies diabetes.  Past Medical History:  Diagnosis Date  . ANXIETY   . Endometrial cancer ([place]) 2007  . Hyperglycemia 04/20/2014  . HYPERLIPIDEMIA   . INSOMNIA-SLEEP DISORDER-UNSPEC   . WEIGHT LOSS    Past Surgical History:  Procedure Laterality Date  . ABDOMINAL HYSTERECTOMY  2007  . BREAST BIOPSY Left 01/26/2014   x3, malignant  . OOPHORECTOMY  2007  . THYROIDECTOMY, PARTIAL  1983    Current Outpatient Medications:  .  ALPRAZolam (XANAX) 0.5 MG tablet, Take 1 tablet (0.5 mg total) by mouth daily as needed., Disp: 30 tablet, Rfl: 5 .  anastrozole (ARIMIDEX) 1 MG tablet, Take 1 tablet (1 mg total) by mouth daily., Disp: 30 tablet, Rfl: 5 .  cetirizine (ZYRTEC) 10 MG tablet, Take 1 tablet (10 mg total) by mouth daily., Disp: 30 tablet, Rfl: 11 .  cholecalciferol (VITAMIN D) 1000 UNITS tablet, Take 1,000 Units by mouth daily. , Disp: , Rfl:  .  co-enzyme Q-10 30 MG capsule, Take 100 mg by mouth daily. , Disp: , Rfl:  .  fluticasone (FLONASE) 50 MCG/ACT nasal spray, Place 2 sprays into both nostrils daily., Disp: 16 g, Rfl: 2 .  Multiple Vitamins-Minerals (MULTIVITAMIN WITH MINERALS) tablet, Take 1 tablet by mouth daily. Forward Gold, Disp: , Rfl:  .  Omega-3 Fatty Acids (FISH OIL) 1000 MG CPDR, Take 1 capsule by mouth daily. , Disp: , Rfl:  .  OVER THE COUNTER MEDICATION, Take 1 tablet by mouth at bedtime as needed. zzquil for sleep , Disp: , Rfl:  .  OVER THE COUNTER MEDICATION, Take 2 mLs by mouth daily. Tumeric with folic acid 500mg /100mg , Disp: , Rfl:   Allergies  Allergen Reactions  . Synthroid [Levothyroxine Sodium]  Swelling    Happened yrs ago  . Pneumovax [Pneumococcal Polysaccharide Vaccine] Other (See Comments)    Patient stated,"I don't know if I'm allergic to this or not. I'm 84 years old."   Review of Systems Objective:  There were no vitals filed for this visit. General AA&O x3. Normal mood and affect.  Vascular Dorsalis pedis and posterior tibial pulses  present 2+ bilaterally  Capillary refill normal to all digits. Pedal hair growth normal.  Neurologic Epicritic sensation grossly present.  Dermatologic  no open lesions.  Right great toenail thick, elongated, pain to palpation  Orthopedic: MMT 5/5 in dorsiflexion, plantarflexion, inversion, and eversion. Normal joint ROM without pain or crepitus.    Assessment & Plan:  Patient was evaluated and treated and all questions answered.  Onychomycosis -Nail palliatively debrided  -Discussed permanent removal of the nail.  Patient declined. -Educated on self-care  Procedure: Nail Debridement Rationale: pain Type of Debridement: manual, sharp debridement. Instrumentation: Nail nipper, rotary burr. Number of Nails: 1   Return if symptoms worsen or fail to improve.

## 2017-04-07 NOTE — Telephone Encounter (Signed)
Done erx 

## 2017-04-07 NOTE — Telephone Encounter (Signed)
Route to CMA °

## 2017-04-14 ENCOUNTER — Other Ambulatory Visit: Payer: Self-pay | Admitting: *Deleted

## 2017-04-14 DIAGNOSIS — Z170 Estrogen receptor positive status [ER+]: Principal | ICD-10-CM

## 2017-04-14 DIAGNOSIS — C50912 Malignant neoplasm of unspecified site of left female breast: Secondary | ICD-10-CM

## 2017-04-14 MED ORDER — ANASTROZOLE 1 MG PO TABS: 1.0000 mg | ORAL_TABLET | Freq: Every day | ORAL | 5 refills | Status: DC

## 2017-05-04 ENCOUNTER — Ambulatory Visit (INDEPENDENT_AMBULATORY_CARE_PROVIDER_SITE_OTHER): Payer: Medicare Other | Admitting: Sports Medicine

## 2017-05-04 ENCOUNTER — Encounter: Payer: Self-pay | Admitting: Sports Medicine

## 2017-05-04 VITALS — BP 112/72 | Ht 66.0 in | Wt 190.0 lb

## 2017-05-04 DIAGNOSIS — M25561 Pain in right knee: Secondary | ICD-10-CM

## 2017-05-04 DIAGNOSIS — G8929 Other chronic pain: Secondary | ICD-10-CM | POA: Diagnosis not present

## 2017-05-04 DIAGNOSIS — M1712 Unilateral primary osteoarthritis, left knee: Secondary | ICD-10-CM

## 2017-05-04 DIAGNOSIS — M25562 Pain in left knee: Secondary | ICD-10-CM | POA: Diagnosis not present

## 2017-05-04 MED ORDER — METHYLPREDNISOLONE ACETATE 40 MG/ML IJ SUSP
40.0000 mg | Freq: Once | INTRAMUSCULAR | Status: AC
  Administered 2017-05-04: 40 mg via INTRA_ARTICULAR

## 2017-05-04 NOTE — Assessment & Plan Note (Signed)
Doris Zamora has chronic osteoarthritis of the left knee. She has tricompartment disease worst in the medial compartment. She is having good benefit with injections and does not want aggressive management. Last injection was over 4 months ago and no medical contraindications. Intraarticular steroid injection performed today.

## 2017-05-04 NOTE — Progress Notes (Signed)
   HPI  Ms. Hausmann returns today requesting left knee steroid injection for relief of chronic left knee arthritis pain. She was last seen in clinic in January to discuss compression sleeve and oral medications and OTC supplements for her pain. She last had a steroid injection in November and reports several weeks/over a month of symptom improvement after that. She is maintaining all her ADLs walking with a cane and is exercising 45 minutes twice per [facility].  CC: Left knee pain follow up  Medications/Interventions Tried: Previous cortisone injections, viscosupplementation, instaflex, turmeric, compression sleeve  See HPI and/or previous note for associated ROS.  Objective: BP 112/72   Ht 5\' 6"  (1.676 m)   Wt 190 lb (86.2 kg)   BMI 30.67 kg/m  Gen: Right-Hand Dominant. NAD, well groomed, normal affect.  CV: Well-perfused. Warm. Resp: Non-labored.  Gait: Gait is antalgic favoring weight to right leg with cane MSK: Left knee is arthritic with patellofemoral crepitus, mild varus deformity, without erythema or effusion, knee flexion and extension strength is good but slightly less than right, left knee flexion ROM limited at about 120 degrees, no posterior fullness or tenderness, no joint laxity  Assessment and plan:  Primary localized osteoarthrosis, lower leg Ms. Higley has chronic osteoarthritis of the left knee. She has tricompartment disease worst in the medial compartment. She is having good benefit with injections and does not want aggressive management. Last injection was over 4 months ago and no medical contraindications. Intraarticular steroid injection performed today.  Procedure: Consent obtained and verified. Time-out conducted. Noted no overlying erythema, induration, or other signs of local infection. Skin prepped in a sterile fashion with alcohol swabs. Topical analgesic spray: Ethyl chloride. Joint: Left Knee Needle: 25g 1.5" Completed without  difficulty. Meds: 3cc 1% xylocaine and 1cc 40mg /mL depo-medrol  Dr. Lilia Argue was present throughout the procedure in a supervising capacity. Advised to call if fevers/chills, erythema, induration, drainage, or persistent bleeding.    Meds ordered this encounter  Medications  . methylPREDNISolone acetate (DEPO-MEDROL) injection 40 mg    Collier Salina, MD PGY-III Internal Medicine Resident 05/04/2017, 2:07 PM   Patient seen and evaluated with the resident. I agree with the above plan of care. Cortisone injection administered without complication. Follow-up as needed.

## 2017-05-22 ENCOUNTER — Ambulatory Visit (INDEPENDENT_AMBULATORY_CARE_PROVIDER_SITE_OTHER): Payer: Medicare Other | Admitting: Podiatry

## 2017-05-22 ENCOUNTER — Telehealth: Payer: Self-pay | Admitting: Hematology

## 2017-05-22 ENCOUNTER — Encounter: Payer: Self-pay | Admitting: Podiatry

## 2017-05-22 DIAGNOSIS — B351 Tinea unguium: Secondary | ICD-10-CM | POA: Diagnosis not present

## 2017-05-22 DIAGNOSIS — M79609 Pain in unspecified limb: Secondary | ICD-10-CM | POA: Diagnosis not present

## 2017-05-22 NOTE — Telephone Encounter (Signed)
PAL - moved 4/25 lab/fu to 5/8. Spoke with patient she is aware.

## 2017-06-04 ENCOUNTER — Ambulatory Visit: Payer: Medicare Other | Admitting: Hematology

## 2017-06-04 ENCOUNTER — Ambulatory Visit: Payer: Medicare Other | Admitting: Internal Medicine

## 2017-06-04 ENCOUNTER — Other Ambulatory Visit: Payer: Medicare Other

## 2017-06-05 ENCOUNTER — Encounter: Payer: Self-pay | Admitting: Internal Medicine

## 2017-06-05 ENCOUNTER — Ambulatory Visit (INDEPENDENT_AMBULATORY_CARE_PROVIDER_SITE_OTHER): Payer: Medicare Other | Admitting: Internal Medicine

## 2017-06-05 VITALS — BP 114/72 | HR 71 | Temp 98.0°F | Ht 66.0 in | Wt 189.0 lb

## 2017-06-05 DIAGNOSIS — J309 Allergic rhinitis, unspecified: Secondary | ICD-10-CM

## 2017-06-05 DIAGNOSIS — H6983 Other specified disorders of Eustachian tube, bilateral: Secondary | ICD-10-CM | POA: Diagnosis not present

## 2017-06-05 DIAGNOSIS — Z0001 Encounter for general adult medical examination with abnormal findings: Secondary | ICD-10-CM

## 2017-06-05 DIAGNOSIS — M1712 Unilateral primary osteoarthritis, left knee: Secondary | ICD-10-CM

## 2017-06-05 MED ORDER — CETIRIZINE HCL 10 MG PO TABS: 10.0000 mg | ORAL_TABLET | Freq: Every day | ORAL | 11 refills | Status: DC

## 2017-06-05 MED ORDER — FLUTICASONE PROPIONATE 50 MCG/ACT NA SUSP: 2.0000 | Freq: Every day | NASAL | 2 refills | Status: AC

## 2017-06-05 NOTE — Patient Instructions (Signed)
Please continue all other medications as before, including the flonase and zyrtec  /You can also take Mucinex (or it's generic off brand) for congestion, and tylenol as needed for pain.  Please have the pharmacy call with any other refills you may need.  Please continue your efforts at being more active, low cholesterol diet, and weight control.  You are otherwise up to date with prevention measures today.  Please keep your appointments with your specialists as you may have planned  Please return in 6 months, or sooner if needed

## 2017-06-05 NOTE — Progress Notes (Signed)
Subjective:    Patient ID: Doris Zamora, female    DOB: 1930/07/30, 84 y.o.   MRN: [id number]  HPI  Here for wellness and f/u;  Overall doing ok;  Pt denies Chest pain, worsening SOB, DOE, wheezing, orthopnea, PND, worsening LE edema, palpitations, dizziness or syncope.  Pt denies neurological change such as new headache, facial or extremity weakness.  Pt denies polydipsia, polyuria, or low sugar symptoms. Pt states overall good compliance with treatment and medications, good tolerability, and has been trying to follow appropriate diet.  Pt denies worsening depressive symptoms, suicidal ideation or panic. No fever, night sweats, wt loss, loss of appetite, or other constitutional symptoms.  Pt states good ability with ADL's, has low fall risk, home safety reviewed and adequate, no other significant changes in vision, and not active with exercise.  Declines all immunizations   Also has bilat ear fullness, reduced hearing acute on chronic, and congestion with popping and crackling and Does have several wks ongoing nasal allergy symptoms with clearish congestion, itch and sneezing, without fever, pain, ST, cough, swelling or wheezing.  Also has persistent left knee pain for many months mild to mod , overall worsening recently, with intermittent swelling but no recent giveaways or falls, has known DJD, no fever or hx of gout Past Medical History:  Diagnosis Date  . ANXIETY   . Endometrial cancer ([place]) 2007  . Hyperglycemia 04/20/2014  . HYPERLIPIDEMIA   . INSOMNIA-SLEEP DISORDER-UNSPEC   . WEIGHT LOSS    Past Surgical History:  Procedure Laterality Date  . ABDOMINAL HYSTERECTOMY  2007  . BREAST BIOPSY Left 01/26/2014   x3, malignant  . OOPHORECTOMY  2007  . THYROIDECTOMY, PARTIAL  1983    reports that she quit smoking about 23 years ago. Her smoking use included cigarettes. She quit after 50.00 years of use. She has never used smokeless tobacco. She reports that she drinks about 4.2 oz of alcohol  per week. She reports that she does not use drugs. family history includes Cancer (age of onset: 3) in her other; Cancer (age of onset: 34) in her sister; Diabetes in her mother; Muscular dystrophy in her father. Allergies  Allergen Reactions  . Synthroid [Levothyroxine Sodium] Swelling    Happened yrs ago  . Pneumovax [Pneumococcal Polysaccharide Vaccine] Other (See Comments)    Patient stated,"I don't know if I'm allergic to this or not. I'm 84 years old."   Current Outpatient Medications on File Prior to Visit  Medication Sig Dispense Refill  . ALPRAZolam (XANAX) 0.5 MG tablet Take 1 tablet (0.5 mg total) by mouth daily as needed. 30 tablet 5  . anastrozole (ARIMIDEX) 1 MG tablet Take 1 tablet (1 mg total) by mouth daily. 30 tablet 5  . cholecalciferol (VITAMIN D) 1000 UNITS tablet Take 1,000 Units by mouth daily.     Marland Kitchen co-enzyme Q-10 30 MG capsule Take 100 mg by mouth daily.     . Multiple Vitamins-Minerals (MULTIVITAMIN WITH MINERALS) tablet Take 1 tablet by mouth daily. Forward Gold    . Omega-3 Fatty Acids (FISH OIL) 1000 MG CPDR Take 1 capsule by mouth daily.     Marland Kitchen OVER THE COUNTER MEDICATION Take 1 tablet by mouth at bedtime as needed. zzquil for sleep     . OVER THE COUNTER MEDICATION Take 2 mLs by mouth daily. Tumeric with folic acid 500mg /100mg      No current facility-administered medications on file prior to visit.    Review of Systems Constitutional: Negative for other  unusual diaphoresis, sweats, appetite or weight changes HENT: Negative for other worsening hearing loss, ear pain, facial swelling, mouth sores or neck stiffness.   Eyes: Negative for other worsening pain, redness or other visual disturbance.  Respiratory: Negative for other stridor or swelling Cardiovascular: Negative for other palpitations or other chest pain  Gastrointestinal: Negative for worsening diarrhea or loose stools, blood in stool, distention or other pain Genitourinary: Negative for hematuria,  flank pain or other change in urine volume.  Musculoskeletal: Negative for myalgias or other joint swelling.  Skin: Negative for other color change, or other wound or worsening drainage.  Neurological: Negative for other syncope or numbness. Hematological: Negative for other adenopathy or swelling Psychiatric/Behavioral: Negative for hallucinations, other worsening agitation, SI, self-injury, or new decreased concentration All other system neg per pt    Objective:   Physical Exam BP 114/72   Pulse 71   Temp 98 F (36.7 C) (Oral)   Ht 5\' 6"  (1.676 m)   Wt 189 lb (85.7 kg)   SpO2 96%   BMI 30.51 kg/m  VS noted, not ill appearing Constitutional: Pt appears in NAD HENT: Head: NCAT.  Right Ear: External ear normal.  Left Ear: External ear normal.  Bilat tm's with mild erythema.  Max sinus areas non tender.  Pharynx with mild erythema, no exudate Eyes: . Pupils are equal, round, and reactive to light. Conjunctivae and EOM are normal Nose: without d/c or deformity Neck: Neck supple. Gross normal ROM Cardiovascular: Normal rate and regular rhythm.   Pulmonary/Chest: Effort normal and breath sounds without rales or wheezing.  Abd:  Soft, NT, ND, + BS, no organomegaly Neurological: Pt is alert. At baseline orientation, motor grossly intact Skin: Skin is warm. No rashes, other new lesions, no LE edema Psychiatric: Pt behavior is normal without agitation  No other exam findings  Declines further labs todaty    Assessment & Plan:

## 2017-06-06 DIAGNOSIS — H6993 Unspecified Eustachian tube disorder, bilateral: Secondary | ICD-10-CM | POA: Insufficient documentation

## 2017-06-06 DIAGNOSIS — H6983 Other specified disorders of Eustachian tube, bilateral: Secondary | ICD-10-CM | POA: Insufficient documentation

## 2017-06-06 NOTE — Assessment & Plan Note (Signed)

## 2017-06-06 NOTE — Assessment & Plan Note (Signed)
Mild to mod, for restart allegra and flonase asd, to f/u any worsening symptoms or concerns

## 2017-06-06 NOTE — Assessment & Plan Note (Addendum)
Mild to mod, for mucinex prn,  to f/u any worsening symptoms or concerns  In addition to the time spent performing CPE, I spent an additional 25 minutes face to face,in which greater than 50% of this time was spent in counseling and coordination of care for patient's acute illness as documented, including the differential dx, treatment, further evaluation and other management of eustachian valve dysfxn, allergic rhinitis and left knee DJD

## 2017-06-06 NOTE — Assessment & Plan Note (Signed)
Left knee, encouraged pt to f/u with [facility] in this office

## 2017-06-07 NOTE — Progress Notes (Signed)
  Subjective:  Patient ID: Doris Zamora, female    DOB: 1930-02-20,  MRN: [id number]  Chief Complaint  Patient presents with  . Nail Problem    check on right great nail, doing well, no pain   84 y.o. female returns for the above complaint. Here for f/u right great toenail. Doing well no pain.  Objective:  There were no vitals filed for this visit. General AA&O x3. Normal mood and affect.  Vascular Pedal pulses palpable.  Neurologic Epicritic sensation grossly intact.  Dermatologic No open lesions. Skin normal texture and turgor.  Orthopedic: No pain to palpation about the toenails.   Assessment & Plan:  Patient was evaluated and treated and all questions answered.  Onychomycosis -No issues. Educated on self-care -F/u PRN.

## 2017-06-15 NOTE — Progress Notes (Signed)
[facility]  Telephone:([phone] Fax:([phone]  Clinic Follow Up Note   Patient Care Team: Biagio Borg, MD as PCP - General (Internal Medicine) Eulis Manly. Gershon Crane, MD as Attending Physician (Ophthalmology) Autumn Messing III, MD as Consulting Physician (General Surgery) Truitt Merle, MD as Consulting Physician (Hematology) 06/17/2017  CHIEF COMPLAINT Follow-up breast cancer  Oncology History   Breast cancer, left breast   Staging form: Breast, AJCC 7th Edition     Clinical: T1c, N0 - Unsigned Endometrial cancer   Staging form: Corpus Uteri - Adenosarcoma, AJCC 7th Edition     Clinical: T1c, N0 - Unsigned       Endometrial cancer ([place])   08/12/2005 Initial Diagnosis    Endometrial cancer, T1bN0, s/p hysrectomy        Breast cancer, left breast ([place])   01/13/2014 Imaging    mammogram and US showed 3 masses at 6 o'clock, retroareolar and 2 o'clock area, measuring 0.7-1.7cm.       01/26/2014 Initial Diagnosis    Breast cancer, left breast, multifocal (3 lesions, biopsy showed 2 similar lesions with lobular features, and the third lesion has ductal features).       02/10/2014 -  Anti-estrogen oral therapy    Pt declined surgery, started anastrozole 1 mg once daily.       09/19/2014 Mammogram     mammogram and ultrasound showed interval decrease in the size and density of the 3 previous biopsy sites of malignancy in the left breast.      08/18/2016 Mammogram    IMPRESSION: Unchanged appearance of the left breast with 3 biopsy proven cancers. No evidence of malignancy in the right breast.      08/18/2016 Imaging    Bone Density 08/18/16 T score -1.4, indicating patient is osteopenic      02/16/2017 Mammogram    Diagnostic bilateral mammogram with tomography: IMPRESSION: No significant interval change in the appearance of the 3 sites of biopsy proven cancer in the left breast. No evidence of right breast malignancy.      04/02/2017 Imaging    Breast MRI  W WO  Contrast 04/02/17 IMPRESSION: 1. 2 cm area of very mild non masslike enhancement within the RETROAREOLAR LEFT breast with LEFT nipple retraction compatible with known malignancy in the RETROAREOLAR region. It is difficult to determine interval change from prior studies given different modalities. 2. Biopsy clips within the UPPER-OUTER LEFT breast and LOWER LEFT breast without adjacent abnormal enhancement. 3. No MR evidence of RIGHT breast malignancy.       HISTORY OF INITIAL PRESENTING ILLNESS (02/08/2014):  Doris Zamora 84 y.o. female is here because of left breast cancer.  She noticed left breast mass and nipple inversion about months ago. No tenderness or nipple discharge. She otherwise feels well. No change of appetite and weight. ROS (+) indigestion but otherwise negative.  She underwent mammogram and [place] which showed 3 masses at the 6:00, retroareola, and 2:00. The masses measures 1-1.7 cm by mammogram, 0.7-0.9 cm by ultrasound. She underwent subsequent biopsy of this 3 lesions which showed invasive mammary carcinoma, 2 of them showed similar morphology and lobular features, one showed ductal features. Both are ER and PR positive.  She tolerated the biopsy well without complications. She feels well in general, denies any pain or new symptoms. No change in her appetite and weight recently. She was seen by breast surgeon Dr. Marlou Starks yesterday, who recommended mastectomy, and she declined upfront surgery.  CURRENT THERAPY: Anastrozole 1 mg once  daily, started on 02/10/2014. Pt held anastrzole in 08/2016 and restarted 01/05/17.   INTERIM HISTORY:  Doris Zamora returns for follow-up. She presents to the clinic today accompanied by her daughter. She notes here hands are swollen and hurt occassionally. She is compliant with Anastrozole. Pt states she did see Dr. Barry Dienes and she has decided not to have cancer. She reports that her breast cancer does not bother right now.   On review of systems, pt  denies breast pain, or any other complaints at this time. Pertinent positives are listed and detailed within the above HPI.   MEDICAL HISTORY:  Past Medical History:  Diagnosis Date  . ANXIETY   . Endometrial cancer ([place]) 2007  . Hyperglycemia 04/20/2014  . HYPERLIPIDEMIA   . INSOMNIA-SLEEP DISORDER-UNSPEC   . WEIGHT LOSS     SURGICAL HISTORY: Past Surgical History:  Procedure Laterality Date  . ABDOMINAL HYSTERECTOMY  2007  . BREAST BIOPSY Left 01/26/2014   x3, malignant  . OOPHORECTOMY  2007  . THYROIDECTOMY, PARTIAL  1983   GYN HISTORY  Menarchal: 14 LMP: age of 84 Contraceptive: 1 yr  HRT: no  G3P3:   SOCIAL HISTORY: History   Social History  . Marital Status: Widowed    Spouse Name: N/A    Number of Children: 3  . Years of Education: N/A   Occupational History  . Not on file.   Social History Main Topics  . Smoking status: Former Smoker -- 50 years    Types: Cigarettes    Quit date: 02/10/1994  . Smokeless tobacco: Never Used  . Alcohol Use: 4.2 oz/week    7 Glasses of wine per week     Comment: 2 0z. HS to help her sleep  . Drug Use: No     Comment: admiited to poat use of marjuana to help her sleep  . Sexual Activity: Not on file   Other Topics Concern  . 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 Diabetes Mother   . Muscular dystrophy Father   . Cancer Other 40       breast cancer   . Cancer Sister 54       pancreatic cancer     ALLERGIES:  is allergic to synthroid [levothyroxine sodium] and pneumovax [pneumococcal polysaccharide vaccine].  MEDICATIONS:  Allergies as of 06/17/2017      Reactions   Synthroid [levothyroxine Sodium] Swelling   Happened yrs ago   Pneumovax [pneumococcal Polysaccharide Vaccine] Other (See Comments)   Patient stated,"I don't know if I'm allergic to this or not. I'm 84 years old."        Medication List        Accurate as of 06/17/17  6:17 PM. Always use your most recent med list.          ALPRAZolam 0.5 MG tablet Commonly known as:  XANAX Take 1 tablet (0.5 mg total) by mouth daily as needed.   anastrozole 1 MG tablet Commonly known as:  ARIMIDEX Take 1 tablet (1 mg total) by mouth daily.   cetirizine 10 MG tablet Commonly known as:  ZYRTEC Take 1 tablet (10 mg total) by mouth daily.   cholecalciferol 1000 units tablet Commonly known as:  VITAMIN D Take 1,000 Units by mouth daily.   co-enzyme Q-10 30 MG capsule Take 100  mg by mouth daily.   Fish Oil 1000 MG Cpdr Take 1 capsule by mouth daily.   fluticasone 50 MCG/ACT nasal spray Commonly known as:  FLONASE Place 2 sprays into both nostrils daily.   multivitamin with minerals tablet Take 1 tablet by mouth daily. Forward Gold   OVER THE COUNTER MEDICATION Take 1 tablet by mouth at bedtime as needed. zzquil for sleep   OVER THE COUNTER MEDICATION Take 2 mLs by mouth daily. Tumeric with folic acid 280KL/491PH      REVIEW OF SYSTEMS:   Constitutional: Denies fevers, chills or abnormal night sweats, denies hot flashes Eyes: Denies blurriness of vision, double vision or watery eyes Ears, nose, mouth, throat, and face: Denies mucositis or sore throat Respiratory: Denies cough, dyspnea or wheezes Cardiovascular: Denies palpitation, chest discomfort or lower extremity swelling Gastrointestinal:  Denies nausea, heartburn or change in bowel habits Skin: Denies abnormal skin rashes Lymphatics: Denies new lymphadenopathy or easy bruising MSK: (+) arthritis in left knee and hand joint swelling Neurological:Denies numbness, tingling or new weaknesses Behavioral/Psych: Mood is stable, no new changes  All other systems were reviewed with the patient and are negative.  PHYSICAL EXAMINATION:  ECOG PERFORMANCE STATUS: 1  Vitals:   06/17/17 1049  BP: (!) 132/54  Pulse: 70  Resp: 18  Temp: 98 F (36.7 C)   SpO2: 99%   Filed Weights   06/17/17 1049  Weight: 187 lb (84.8 kg)    GENERAL:alert, no distress and comfortable SKIN: skin color, texture, turgor are normal, no rashes or significant lesions EYES: normal, conjunctiva are pink and non-injected, sclera clear OROPHARYNX:no exudate, no erythema and lips, buccal mucosa, and tongue normal  NECK: supple, thyroid normal size, non-tender, without nodularity LYMPH:  no palpable lymphadenopathy in the cervical, axillary or inguinal LUNGS: clear to auscultation and percussion with normal breathing effort HEART: regular rate & rhythm and no murmurs and no lower extremity edema ABDOMEN:abdomen soft, non-tender and normal bowel sounds Musculoskeletal:no cyanosis of digits and no clubbing  PSYCH: alert & oriented x 3 with fluent speech NEURO: no focal motor/sensory deficits Breasts: Breast inspection showed them to be symmetrical, (+) Palpation of the right breast and bilateral  axilla revealed no obvious mass that I could appreciate. In the left breast there is a few small lumps in the inferior 6 o'clock position of the left breast below the nipple, together measuring approximately 1.5 cm (previously 2 x 2 cm) with skin thickening and mild skin retraction , no ulcers or discharge.   LABORATORY DATA:  I have reviewed the data as listed CBC Latest Ref Rng & Units 06/17/2017 03/06/2017 01/05/2017  WBC 3.9 - 10.3 K/uL 4.0 5.6 4.6  Hemoglobin 11.6 - 15.9 g/dL 13.3 12.9 13.0  Hematocrit 34.8 - 46.6 % 39.8 39.6 39.8  Platelets 145 - 400 K/uL 203 215 196    CMP Latest Ref Rng & Units 06/17/2017 03/06/2017 01/05/2017  Glucose 70 - 140 mg/dL 104 90 91  BUN 7 - 26 mg/dL 12 12 11.0  Creatinine 0.60 - 1.10 mg/dL 0.81 0.79 0.8  Sodium 136 - 145 mmol/L 136 138 139  Potassium 3.5 - 5.1 mmol/L 4.2 4.3 4.4  Chloride 98 - 109 mmol/L 105 103 -  CO2 22 - 29 mmol/L _0 Calcium 8.4 - 10.4 mg/dL 9.4 9.0 9.0  Total Protein 6.4 - 8.3 g/dL 8.6(H) 7.9 8.0  Total  Bilirubin 0.2 - 1.2 mg/dL 0.7 0.3 0.55  Alkaline Phos 40 - 150 U/L 99  104 73  AST 5 - 34 U/L 64(H) 55(H) 40(H)  ALT 0 - 55 U/L 65(H) 49 36     PATHOLOGY REPORT Diagnosis 01/26/2014 1. Breast, left, needle core biopsy, mass, lower inner, 6:30 o'clock - INVASIVE MAMMARY CARCINOMA. - SEE COMMENT. 2. Breast, left, needle core biopsy, mass, retroareolar - INVASIVE MAMMARY CARCINOMA. - MAMMARY CARCINOMA IN SITU. - SEE COMMENT. 3. Breast, left, needle core biopsy, mass, 2:30 o'clock - INVASIVE MAMMARY CARCINOMA. - MAMMARY CARCINOMA IN SITU WITH CALCIFICATIONS. - SEE COMMENT. Microscopic Comment 1. The carcinoma is grade I and has some lobular features. 2. The carcinoma on part 2 is morphologically identical to that in part 1. 3. The carcinoma appears grade II and is likely a ductal phenotype. Breast prognostic profile will be performed on parts 1 and 3 and the results reported separately. The results were called to [facility] on 01/27/14.  Estrogen Receptor: 100%, POSITIVE, STRONG STAINING INTENSITY Progesterone Receptor: 100%, POSITIVE, STRONG STAINING INTENSITY Proliferation Marker Ki67: 29%  Results: HER-2/NEU BY CISH - NO AMPLIFICATION OF HER-2 DETECTED. RESULT RATIO OF HER2: CEP 17 SIGNALS 0.94 AVERAGE HER2 COPY NUMBER PER CELL 1.65   RADIOGRAPHIC STUDIES: I have personally reviewed the radiological images as listed and agreed with the findings in the report.  Breast MRI  W WO Contrast 04/02/17 IMPRESSION: 1. 2 cm area of very mild non masslike enhancement within the RETROAREOLAR LEFT breast with LEFT nipple retraction compatible with known malignancy in the RETROAREOLAR region. It is difficult to determine interval change from prior studies given different modalities. 2. Biopsy clips within the UPPER-OUTER LEFT breast and LOWER LEFT breast without adjacent abnormal enhancement. 3. No MR evidence of RIGHT breast malignancy.  Mammogram diagnostic  bilateral breast, 02/16/17 IMPRESSION: No significant interval change in the appearance of the 3 sites of biopsy proven cancer in the left breast. No evidence of right breast malignancy.   Bone Density 08/18/16 T score -1.4, indicating patient is osteopenic  Bilateral Diagnostic Mammogram 08/18/16 IMPRESSION: Unchanged appearance of the left breast with 3 biopsy proven cancers. No evidence of malignancy in the right breast.  Bilateral diagnostic mammogram 02/19/2016 IMPRESSION: Unchanged appearance of 3 synchronous biopsy-proven left breast cancers. Follow-up as clinically indicated. No mammographic evidence of right breast malignancy.  ASSESSMENT & PLAN:  84 y.o. female with past medical history of stage I endometrial cancer, status post hysterectomy, anxiety and insomnia, who was recently diagnosed with left breast cancer.  1. Left breast cancer, 3 synchronized lesions, 2 of them with lobular features and one has ductal features. T1b-1cN0M0, stage I, ER+/PR+/HER2- -I previously reviewed her breast mass biopsy findings and image findings with patient and her daughter extensively. She has 3 synchronized early stage breast cancer without clinical lymph nodes involvement. I discussed that surgical resection, likely mastectomy due to the location of this 3 masses, would be the only curative treatment for her breast cancer. She previously declined surgery repeatly.  - She has been tolerating anastrozole very well, we'll continue indefinitely if no surgery.  -Her repeated mammogram and ultrasound showed partial response after 2 year of treatment -I'll continue diagnostic mammogram and [place] every 6 months for monitoring. -She notes stopping anastrozole in 08/2016 mistakenly and restarted on 01/05/17 and has been complaint since then  -Mammogram on 02/16/17 showed: No significant interval change in the appearance of the 3 sites of biopsy proven cancer in the left breast. No evidence of right breast  malignancy. -I previously discussed the option of surgery,  and strongly encouraged her to consider surgery. She is overall healthy for her age.  After lengthy discussion, she is open to surgery now.  Her daughter knows Dr. Barry Dienes, who has operated on elderly she knows well.  So daughter and the patient wants switch her surgeon to Dr. Barry Dienes. I sent a referral to Dr. Barry Dienes. -Pt saw Dr. Barry Dienes who offered surgery. Pt has decided to not undergo surgery and would just like to be watched and continue Anastrozole. I again discussed that there is a chance that her cancer grows and spreads outside the breasts. She voiced good understanding.  -She is clinically doing well, no adenopathy on physical exam, left breast mass is stable and has not increased in size since my last exam. She will continue anastrozole -Labs reviewed today, CBC is WNL and CMP revealed elevated liver enzymes. Will monitor -Left Mammogram and [place] in July 2019, ordered today. I will see her back after   2. Genetics -She has personal history of endometrial cancer, family history of breast cancer and pancreatic cancer. I previously discussed the genetic screening for inherited breast cancer and lynch syndrome, she declined at this point.   3. Arthritis  -She'll continue follow-up with her primary care physician and orthopedic surgeon -She will continue to follow-up with her primary care physician for other medical issues. -She gets gel shots in her left knee by her PCP and takes tumeric for her arthritis pain.  -She reports new hand swelling and mild pain in her joints. She has been taking Anastrozole for 3 years so it is likely unrelated. I advised her to use Ibuprofen instead of Tylenol due to her elevated liver enzymes   4. Osteopenia - DEXA scan on 08/18/16 showed T score -1.4, low risk osteopenia - The patient will begin to take calcium and vitamin D daily to support bone health.  5. Transaminitis  -Her lab previously showed  elevated liver enzymes. She had mild intermittent transaminitis in the past -will continue monitoring, repeat lab in 6 weeks, if gets worse, we'll hold anastrozole, or get a imaging study of her liver.  -AST reduced to 40  (01/05/17) adequate to conitnue with anastrozole.  -Stable, ALT normal, AST mildly elevated previously -AST is 64 and ALT is 65 (06/17/17). Will continue to monitor   Plan -Continue Anastrozole  -Left [place] and Mammogram in July 2019 -F/u in 2 months with lab   I spent 20 minutes face to face with the patient and a total of 25 mins spent in counseling and/or coordination of care.  This document serves as a record of services personally performed by Truitt Merle, MD. It was created on her behalf by Theresia Bough, a trained medical scribe. The creation of this record is based on the scribe's personal observations and the provider's statements to them.   I have reviewed the above documentation for accuracy and completeness, and I agree with the above.   Truitt Merle  06/17/2017

## 2017-06-17 ENCOUNTER — Inpatient Hospital Stay: Payer: Medicare Other | Attending: Hematology

## 2017-06-17 ENCOUNTER — Inpatient Hospital Stay (HOSPITAL_BASED_OUTPATIENT_CLINIC_OR_DEPARTMENT_OTHER): Payer: Medicare Other | Admitting: Hematology

## 2017-06-17 ENCOUNTER — Encounter: Payer: Self-pay | Admitting: Hematology

## 2017-06-17 ENCOUNTER — Telehealth: Payer: Self-pay

## 2017-06-17 VITALS — BP 132/54 | HR 70 | Temp 98.0°F | Resp 18 | Ht 66.0 in | Wt 187.0 lb

## 2017-06-17 DIAGNOSIS — Z79811 Long term (current) use of aromatase inhibitors: Secondary | ICD-10-CM

## 2017-06-17 DIAGNOSIS — C50912 Malignant neoplasm of unspecified site of left female breast: Secondary | ICD-10-CM

## 2017-06-17 DIAGNOSIS — Z87891 Personal history of nicotine dependence: Secondary | ICD-10-CM | POA: Insufficient documentation

## 2017-06-17 DIAGNOSIS — Z170 Estrogen receptor positive status [ER+]: Secondary | ICD-10-CM | POA: Insufficient documentation

## 2017-06-17 DIAGNOSIS — M859 Disorder of bone density and structure, unspecified: Secondary | ICD-10-CM

## 2017-06-17 DIAGNOSIS — Z8541 Personal history of malignant neoplasm of cervix uteri: Secondary | ICD-10-CM

## 2017-06-17 LAB — CBC WITH DIFFERENTIAL/PLATELET
BASOS ABS: 0 10*3/uL (ref 0.0–0.1)
BASOS PCT: 1 %
EOS PCT: 1 %
Eosinophils Absolute: 0 10*3/uL (ref 0.0–0.5)
HEMATOCRIT: 39.8 % (ref 34.8–46.6)
Hemoglobin: 13.3 g/dL (ref 11.6–15.9)
LYMPHS PCT: 23 %
Lymphs Abs: 0.9 10*3/uL (ref 0.9–3.3)
MCH: 31.1 pg (ref 25.1–34.0)
MCHC: 33.5 g/dL (ref 31.5–36.0)
MCV: 92.9 fL (ref 79.5–101.0)
Monocytes Absolute: 0.3 10*3/uL (ref 0.1–0.9)
Monocytes Relative: 8 %
NEUTROS ABS: 2.7 10*3/uL (ref 1.5–6.5)
Neutrophils Relative %: 67 %
PLATELETS: 203 10*3/uL (ref 145–400)
RBC: 4.29 MIL/uL (ref 3.70–5.45)
RDW: 13.9 % (ref 11.2–14.5)
WBC: 4 10*3/uL (ref 3.9–10.3)

## 2017-06-17 LAB — COMPREHENSIVE METABOLIC PANEL
ALBUMIN: 3.3 g/dL — AB (ref 3.5–5.0)
ALT: 65 U/L — AB (ref 0–55)
AST: 64 U/L — AB (ref 5–34)
Alkaline Phosphatase: 99 U/L (ref 40–150)
Anion gap: 5 (ref 3–11)
BUN: 12 mg/dL (ref 7–26)
CHLORIDE: 105 mmol/L (ref 98–109)
CO2: 26 mmol/L (ref 22–29)
Calcium: 9.4 mg/dL (ref 8.4–10.4)
Creatinine, Ser: 0.81 mg/dL (ref 0.60–1.10)
GFR calc Af Amer: >60 mL/min (ref >60)
GLUCOSE: 104 mg/dL (ref 70–140)
Potassium: 4.2 mmol/L (ref 3.5–5.1)
Sodium: 136 mmol/L (ref 136–145)
Total Bilirubin: 0.7 mg/dL (ref 0.2–1.2)
Total Protein: 8.6 g/dL — ABNORMAL HIGH (ref 6.4–8.3)

## 2017-06-17 NOTE — Telephone Encounter (Signed)
Printed avs and calender of upcoming appointment per 5/8 los

## 2017-06-19 ENCOUNTER — Encounter: Payer: Self-pay | Admitting: Family Medicine

## 2017-06-19 ENCOUNTER — Ambulatory Visit (INDEPENDENT_AMBULATORY_CARE_PROVIDER_SITE_OTHER): Payer: Medicare Other | Admitting: Family Medicine

## 2017-06-19 ENCOUNTER — Ambulatory Visit: Payer: Self-pay

## 2017-06-19 VITALS — BP 126/64 | HR 65 | Resp 97 | Ht 66.0 in | Wt 189.0 lb

## 2017-06-19 DIAGNOSIS — M25562 Pain in left knee: Principal | ICD-10-CM

## 2017-06-19 DIAGNOSIS — G8929 Other chronic pain: Secondary | ICD-10-CM

## 2017-06-19 DIAGNOSIS — M1712 Unilateral primary osteoarthritis, left knee: Secondary | ICD-10-CM | POA: Diagnosis not present

## 2017-06-19 NOTE — Assessment & Plan Note (Addendum)
Acute on chronic exacerbation of her left knee pain.  X-rays show bone-on-bone in the medial compartment -Injection today -Referral to Ortho for the consideration of a knee replacement.

## 2017-06-19 NOTE — Patient Instructions (Signed)
Nice to meet you  Please try to ice  Please try to continue to be active  I have made a referral today

## 2017-06-19 NOTE — Progress Notes (Signed)
Doris Zamora - 84 y.o. female MRN [id number]  Date of birth: Jul 31, 1930  SUBJECTIVE:  Including CC & ROS.  Chief Complaint  Patient presents with  . Left knee pain    Doris Zamora is a 84 y.o. female that is presenting with left knee pain. Pain increasing over the past month. Located medial aspect. She received injections prior with some improvement. Her last injection was Monovisc given 12/30/16. She uses a cane for assistance to walk daily. Denies swelling. Pain constant, denies certain movements that trigger the pain.  Denies any recent injury or changes.  She would like to be referred to have a discussion with a surgeon for knee replacement.  Review of her left knee x-ray from 05/29/2016 shows significant degenerative changes with bone-on-bone in the medial compartment   Review of Systems  Constitutional: Negative for fever.  HENT: Negative for congestion.   Respiratory: Negative for cough.   Cardiovascular: Negative for chest pain.  Gastrointestinal: Negative for abdominal pain.  Musculoskeletal: Positive for arthralgias and gait problem.  Skin: Negative for color change.  Neurological: Negative for weakness.  Psychiatric/Behavioral: Negative for agitation.    HISTORY: Past Medical, Surgical, Social, and Family History Reviewed & Updated per EMR.   Pertinent Historical Findings include:  Past Medical History:  Diagnosis Date  . ANXIETY   . Endometrial cancer ([place]) 2007  . Hyperglycemia 04/20/2014  . HYPERLIPIDEMIA   . INSOMNIA-SLEEP DISORDER-UNSPEC   . WEIGHT LOSS     Past Surgical History:  Procedure Laterality Date  . ABDOMINAL HYSTERECTOMY  2007  . BREAST BIOPSY Left 01/26/2014   x3, malignant  . OOPHORECTOMY  2007  . THYROIDECTOMY, PARTIAL  1983    Allergies  Allergen Reactions  . Synthroid [Levothyroxine Sodium] Swelling    Happened yrs ago  . Pneumovax [Pneumococcal Polysaccharide Vaccine] Other (See Comments)    Patient stated,"I don't know if I'm  allergic to this or not. I'm 84 years old."    Family History  Problem Relation Age of Onset  . Diabetes Mother   . Muscular dystrophy Father   . Cancer Other 40       breast cancer   . Cancer Sister 28       pancreatic cancer      Social History   Socioeconomic History  . Marital status: Widowed    Spouse name: Not on file  . Number of children: Not on file  . Years of education: Not on file  . Highest education level: Not on file  Occupational History  . Not on file  Social Needs  . Financial resource strain: Not on file  . Food insecurity:    Worry: Not on file    Inability: Not on file  . Transportation needs:    Medical: Not on file    Non-medical: Not on file  Tobacco Use  . Smoking status: Former Smoker    Years: 50.00    Types: Cigarettes    Last attempt to quit: 02/10/1994    Years since quitting: 23.3  . Smokeless tobacco: Never Used  Substance and Sexual Activity  . Alcohol use: Yes    Alcohol/week: 4.2 oz    Types: 7 Glasses of wine per week    Comment: 2 0z. HS to help her sleep  . Drug use: No    Comment: admiited to poat use of marjuana to help her sleep  . Sexual activity: Not on file  Lifestyle  . Physical activity:    Days per  week: Not on file    Minutes per session: Not on file  . Stress: Not on file  Relationships  . Social connections:    Talks on phone: Not on file    Gets together: Not on file    Attends religious service: Not on file    Active member of club or organization: Not on file    Attends meetings of clubs or organizations: Not on file    Relationship status: Not on file  . Intimate partner violence:    Fear of current or ex partner: Not on file    Emotionally abused: Not on file    Physically abused: Not on file    Forced sexual activity: Not on file  Other Topics Concern  . 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     PHYSICAL EXAM:  VS: BP 126/64 (BP  Location: Left Arm, Patient Position: Sitting, Cuff Size: Normal)   Pulse 65   Resp (!) 97   Ht 5\' 6"  (1.676 m)   Wt 189 lb (85.7 kg)   BMI 30.51 kg/m  Physical Exam Gen: NAD, alert, cooperative with exam, well-appearing ENT: normal lips, normal nasal mucosa,  Eye: normal EOM, normal conjunctiva and lids CV:  no edema, +2 pedal pulses   Resp: no accessory muscle use, non-labored,  Skin: no rashes, no areas of induration  Neuro: normal tone, normal sensation to touch Psych:  normal insight, alert and oriented MSK:  Right Knee: Normal to inspection with no erythema or obvious bony abnormalities. Palpation normal with no warmth, patellar tenderness, or condyle tenderness Mild medial joint line tenderness. ROM full in flexion and extension and lower leg rotation. Ligaments with solid consistent endpoints including  LCL, MCL. Negative Mcmurray's Non painful patellar compression. Patellar glide without crepitus. Patellar and quadriceps tendons unremarkable. Hamstring and quadriceps strength is normal.  Neurovascularly intact   Aspiration/Injection Procedure Note Doris Zamora Feb 19, 1930  Procedure: Injection Indications: Left knee pain  Procedure Details Consent: Risks of procedure as well as the alternatives and risks of each were explained to the (patient/caregiver).  Consent for procedure obtained. Time Out: Verified patient identification, verified procedure, site/side was marked, verified correct patient position, special equipment/implants available, medications/allergies/relevent history reviewed, required imaging and test results available.  Performed.  The area was cleaned with iodine and alcohol swabs.    The left knee superior lateral SPP was injected using 1 cc's of 40 mg Depomedrol and 4 cc's of 1% lidocaine with a 25 1 1/2" needle.  Ultrasound was used. Images were obtained in  Long views showing the injection.    A sterile dressing was applied.  Patient did  tolerate procedure well.   ASSESSMENT & PLAN:   Primary localized osteoarthrosis, lower leg Acute on chronic exacerbation of her left knee pain.  X-rays show bone-on-bone in the medial compartment -Injection today -Referral to Ortho for the consideration of a knee replacement.

## 2017-07-02 ENCOUNTER — Telehealth: Payer: Self-pay | Admitting: Internal Medicine

## 2017-07-02 NOTE — Telephone Encounter (Signed)
Forms have been given to PCP to review and sign.

## 2017-07-02 NOTE — Telephone Encounter (Signed)
Surgery Clearance Form dropped off for pt from [facility].  Please fax completed form to Santiago Bur, fax # [phone].  Form put in Dr. Gwynn Burly box for pick up & completion.

## 2017-07-07 NOTE — Telephone Encounter (Signed)
Forms have been faxed back and sent to scan.

## 2017-08-10 ENCOUNTER — Ambulatory Visit
Admission: RE | Admit: 2017-08-10 | Discharge: 2017-08-10 | Disposition: A | Discharge status: Home / Self Care | Payer: Medicare Other | Source: Ambulatory Visit | Attending: Hematology | Admitting: Hematology

## 2017-08-10 ENCOUNTER — Ambulatory Visit: Payer: Medicare Other

## 2017-08-10 DIAGNOSIS — C50912 Malignant neoplasm of unspecified site of left female breast: Secondary | ICD-10-CM

## 2017-08-10 DIAGNOSIS — Z170 Estrogen receptor positive status [ER+]: Principal | ICD-10-CM

## 2017-08-11 NOTE — Progress Notes (Signed)
[place]  [phone] [phone]  Clinic Follow Up Note   Patient Care Team: Biagio Borg, MD as PCP - General (Internal Medicine) Eulis Manly. Gershon Crane, MD as Attending Physician (Ophthalmology) Autumn Messing III, MD as Consulting Physician (General Surgery) Truitt Merle, MD as Consulting Physician (Hematology) 08/12/2017  CHIEF COMPLAINT Follow-up breast cancer  Oncology History   Breast cancer, left breast   Staging form: Breast, AJCC 7th Edition     Clinical: T1c, N0 - Unsigned Endometrial cancer   Staging form: Corpus Uteri - Adenosarcoma, AJCC 7th Edition     Clinical: T1c, N0 - Unsigned       Endometrial cancer ([place])   08/12/2005 Initial Diagnosis    Endometrial cancer, T1bN0, s/p hysrectomy        Breast cancer, left breast ([place])   01/13/2014 Imaging    mammogram and US showed 3 masses at 6 o'clock, retroareolar and 2 o'clock area, measuring 0.7-1.7cm.       01/26/2014 Initial Diagnosis    Breast cancer, left breast, multifocal (3 lesions, biopsy showed 2 similar lesions with lobular features, and the third lesion has ductal features).       02/10/2014 -  Anti-estrogen oral therapy    Pt declined surgery, started anastrozole 1 mg once daily.       09/19/2014 Mammogram     mammogram and ultrasound showed interval decrease in the size and density of the 3 previous biopsy sites of malignancy in the left breast.      08/18/2016 Mammogram    IMPRESSION: Unchanged appearance of the left breast with 3 biopsy proven cancers. No evidence of malignancy in the right breast.      08/18/2016 Imaging    Bone Density 08/18/16 T score -1.4, indicating patient is osteopenic      02/16/2017 Mammogram    Diagnostic bilateral mammogram with tomography: IMPRESSION: No significant interval change in the appearance of the 3 sites of biopsy proven cancer in the left breast. No evidence of right breast malignancy.      04/02/2017 Imaging    Breast MRI  W WO  Contrast 04/02/17 IMPRESSION: 1. 2 cm area of very mild non masslike enhancement within the RETROAREOLAR LEFT breast with LEFT nipple retraction compatible with known malignancy in the RETROAREOLAR region. It is difficult to determine interval change from prior studies given different modalities. 2. Biopsy clips within the UPPER-OUTER LEFT breast and LOWER LEFT breast without adjacent abnormal enhancement. 3. No MR evidence of RIGHT breast malignancy.      08/10/2017 Mammogram    08/10/2017 MM Diag Breast TOMO Bilateral IMPRESSION: 1. No significant interval change in the mammographic appearance of 3 sites of biopsy proven left breast cancer. 2. No mammographic evidence of malignancy on the right.       HISTORY OF INITIAL PRESENTING ILLNESS (02/08/2014):  Doris Zamora 84 y.o. female is here because of left breast cancer.  She noticed left breast mass and nipple inversion about months ago. No tenderness or nipple discharge. She otherwise feels well. No change of appetite and weight. ROS (+) indigestion but otherwise negative.  She underwent mammogram and [place] which showed 3 masses at the 6:00, retroareola, and 2:00. The masses measures 1-1.7 cm by mammogram, 0.7-0.9 cm by ultrasound. She underwent subsequent biopsy of this 3 lesions which showed invasive mammary carcinoma, 2 of them showed similar morphology and lobular features, one showed ductal features. Both are ER and PR positive.  She tolerated the biopsy well  without complications. She feels well in general, denies any pain or new symptoms. No change in her appetite and weight recently. She was seen by breast surgeon Dr. Marlou Starks yesterday, who recommended mastectomy, and she declined upfront surgery.  CURRENT THERAPY: Anastrozole 1 mg once daily, started on 02/10/2014. Pt held anastrzole in 08/2016 and restarted 01/05/17.   INTERIM HISTORY:  Doris Zamora  Is a 84 y.o. female who returns for follow-up. She presents to the clinic today with  her family member. She feels well. She complains of left knee pain and uses a cane to ambulate. She wants to have a knee replacement in the coming months. She goes to the gym twice a week a the senior center. No other pain.    MEDICAL HISTORY:  Past Medical History:  Diagnosis Date  . ANXIETY   . Endometrial cancer ([place]) 2007  . Hyperglycemia 04/20/2014  . HYPERLIPIDEMIA   . INSOMNIA-SLEEP DISORDER-UNSPEC   . WEIGHT LOSS     SURGICAL HISTORY: Past Surgical History:  Procedure Laterality Date  . ABDOMINAL HYSTERECTOMY  2007  . BREAST BIOPSY Left 01/26/2014   x3, malignant  . OOPHORECTOMY  2007  . THYROIDECTOMY, PARTIAL  1983   GYN HISTORY  Menarchal: 14 LMP: age of 62 Contraceptive: 1 yr  HRT: no  G3P3:   SOCIAL HISTORY: History   Social History  . Marital Status: Widowed    Spouse Name: N/A    Number of Children: 3  . Years of Education: N/A   Occupational History  . Not on file.   Social History Main Topics  . Smoking status: Former Smoker -- 50 years    Types: Cigarettes    Quit date: 02/10/1994  . Smokeless tobacco: Never Used  . Alcohol Use: 4.2 oz/week    7 Glasses of wine per week     Comment: 2 0z. HS to help her sleep  . Drug Use: No     Comment: admiited to poat use of marjuana to help her sleep  . Sexual Activity: Not on file   Other Topics Concern  . 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 Diabetes Mother   . Muscular dystrophy Father   . Cancer Other 40       breast cancer   . Cancer Sister 65       pancreatic cancer     ALLERGIES:  is allergic to synthroid [levothyroxine sodium] and pneumovax [pneumococcal polysaccharide vaccine].  MEDICATIONS:  Allergies as of 08/12/2017      Reactions   Synthroid [levothyroxine Sodium] Swelling   Happened yrs ago   Pneumovax [pneumococcal Polysaccharide Vaccine] Other  (See Comments)   Patient stated,"I don't know if I'm allergic to this or not. I'm 84 years old."      Medication List        Accurate as of 08/12/17 11:33 AM. Always use your most recent med list.          ALPRAZolam 0.5 MG tablet Commonly known as:  XANAX Take 1 tablet (0.5 mg total) by mouth daily as needed.   anastrozole 1 MG tablet Commonly known as:  ARIMIDEX Take 1 tablet (1 mg total) by mouth daily.   cetirizine 10 MG tablet Commonly known as:  ZYRTEC Take 1 tablet (10 mg total) by mouth daily.  cholecalciferol 1000 units tablet Commonly known as:  VITAMIN D Take 1,000 Units by mouth daily.   co-enzyme Q-10 30 MG capsule Take 100 mg by mouth daily.   Fish Oil 1000 MG Cpdr Take 1 capsule by mouth daily.   fluticasone 50 MCG/ACT nasal spray Commonly known as:  FLONASE Place 2 sprays into both nostrils daily.   multivitamin with minerals tablet Take 1 tablet by mouth daily. Forward Gold   OVER THE COUNTER MEDICATION Take 1 tablet by mouth at bedtime as needed. zzquil for sleep   OVER THE COUNTER MEDICATION Take 2 mLs by mouth daily. Tumeric with folic acid 84YO/[id number]      REVIEW OF SYSTEMS:   Constitutional: Denies fevers, chills or abnormal night sweats, denies hot flashes (+) uses cane  Eyes: Denies blurriness of vision, double vision or watery eyes Ears, nose, mouth, throat, and face: Denies mucositis or sore throat Respiratory: Denies cough, dyspnea or wheezes Cardiovascular: Denies palpitation, chest discomfort or lower extremity swelling Gastrointestinal:  Denies nausea, heartburn or change in bowel habits Skin: Denies abnormal skin rashes Lymphatics: Denies new lymphadenopathy or easy bruising MSK: (+) arthritis in left knee and hand joint swelling Neurological:Denies numbness, tingling or new weaknesses Behavioral/Psych: Mood is stable, no new changes  All other systems were reviewed with the patient and are negative.  PHYSICAL EXAMINATION:    ECOG PERFORMANCE STATUS: 1  Vitals:   08/12/17 1118  BP: (!) 133/58  Pulse: 63  Resp: 17  Temp: 98 F (36.7 C)  SpO2: 100%   Filed Weights   08/12/17 1118  Weight: 190 lb 12.8 oz (86.5 kg)    GENERAL:alert, no distress and comfortable(+) uses cane SKIN: skin color, texture, turgor are normal, no rashes or significant lesions EYES: normal, conjunctiva are pink and non-injected, sclera clear OROPHARYNX:no exudate, no erythema and lips, buccal mucosa, and tongue normal  NECK: supple, thyroid normal size, non-tender, without nodularity LYMPH:  no palpable lymphadenopathy in the cervical, axillary or inguinal LUNGS: clear to auscultation and percussion with normal breathing effort HEART: regular rate & rhythm and no murmurs and no lower extremity edema ABDOMEN:abdomen soft, non-tender and normal bowel sounds  Musculoskeletal:no cyanosis of digits and no clubbing  PSYCH: alert & oriented x 3 with fluent speech NEURO: no focal motor/sensory deficits Breasts:  (+) Palpation of the right breast and bilateral  axilla revealed no obvious mass that I could appreciate. In the left breast there is a few small lumps in the inferior 6 o'clock position of the left breast below the nipple, together measuring approximately 1.5 cm (previously 2 x 2 cm) with skin thickening and mild skin retraction , no ulcers or discharge.   LABORATORY DATA:  I have reviewed the data as listed CBC Latest Ref Rng & Units 08/12/2017 06/17/2017 03/06/2017  WBC 3.9 - 10.3 K/uL 4.0 4.0 5.6  Hemoglobin 11.6 - 15.9 g/dL 12.5 13.3 12.9  Hematocrit 34.8 - 46.6 % 37.7 39.8 39.6  Platelets 145 - 400 K/uL 207 203 215    CMP Latest Ref Rng & Units 08/12/2017 06/17/2017 03/06/2017  Glucose 70 - 99 mg/dL 120(H) 104 90  BUN 8 - 23 mg/dL 12 12 12   Creatinine 0.44 - 1.00 mg/dL 0.79 0.81 0.79  Sodium 135 - 145 mmol/L 139 136 138  Potassium 3.5 - 5.1 mmol/L 4.2 4.2 4.3  Chloride 98 - 111 mmol/L 104 105 103  CO2 22 - 32 mmol/L 30 26 29    Calcium 8.9 - 10.3 mg/dL 9.1 9.4  9.0  Total Protein 6.5 - 8.1 g/dL 7.5 8.6(H) 7.9  Total Bilirubin 0.3 - 1.2 mg/dL 0.5 0.7 0.3  Alkaline Phos 38 - 126 U/L 88 99 104  AST 15 - 41 U/L 32 64(H) 55(H)  ALT 0 - 44 U/L 28 65(H) 49     PATHOLOGY REPORT Diagnosis 01/26/2014 1. Breast, left, needle core biopsy, mass, lower inner, 6:30 o'clock - INVASIVE MAMMARY CARCINOMA. - SEE COMMENT. 2. Breast, left, needle core biopsy, mass, retroareolar - INVASIVE MAMMARY CARCINOMA. - MAMMARY CARCINOMA IN SITU. - SEE COMMENT. 3. Breast, left, needle core biopsy, mass, 2:30 o'clock - INVASIVE MAMMARY CARCINOMA. - MAMMARY CARCINOMA IN SITU WITH CALCIFICATIONS. - SEE COMMENT. Microscopic Comment 1. The carcinoma is grade I and has some lobular features. 2. The carcinoma on part 2 is morphologically identical to that in part 1. 3. The carcinoma appears grade II and is likely a ductal phenotype. Breast prognostic profile will be performed on parts 1 and 3 and the results reported separately. The results were called to [facility] on 01/27/14.  Estrogen Receptor: 100%, POSITIVE, STRONG STAINING INTENSITY Progesterone Receptor: 100%, POSITIVE, STRONG STAINING INTENSITY Proliferation Marker Ki67: 29%  Results: HER-2/NEU BY CISH - NO AMPLIFICATION OF HER-2 DETECTED. RESULT RATIO OF HER2: CEP 17 SIGNALS 0.94 AVERAGE HER2 COPY NUMBER PER CELL 1.65   RADIOGRAPHIC STUDIES: I have personally reviewed the radiological images as listed and agreed with the findings in the report.  08/10/2017 MM Diag Breast TOMO Bilateral IMPRESSION: 1. No significant interval change in the mammographic appearance of 3 sites of biopsy proven left breast cancer. 2. No mammographic evidence of malignancy on the right.  Breast MRI  W WO Contrast 04/02/17 IMPRESSION: 1. 2 cm area of very mild non masslike enhancement within the RETROAREOLAR LEFT breast with LEFT nipple retraction compatible with known  malignancy in the RETROAREOLAR region. It is difficult to determine interval change from prior studies given different modalities. 2. Biopsy clips within the UPPER-OUTER LEFT breast and LOWER LEFT breast without adjacent abnormal enhancement. 3. No MR evidence of RIGHT breast malignancy.  Mammogram diagnostic bilateral breast, 02/16/17 IMPRESSION: No significant interval change in the appearance of the 3 sites of biopsy proven cancer in the left breast. No evidence of right breast malignancy.   Bone Density 08/18/16 T score -1.4, indicating patient is osteopenic  Bilateral Diagnostic Mammogram 08/18/16 IMPRESSION: Unchanged appearance of the left breast with 3 biopsy proven cancers. No evidence of malignancy in the right breast.  Bilateral diagnostic mammogram 02/19/2016 IMPRESSION: Unchanged appearance of 3 synchronous biopsy-proven left breast cancers. Follow-up as clinically indicated. No mammographic evidence of right breast malignancy.  ASSESSMENT & PLAN:  84 y.o. female with past medical history of stage I endometrial cancer, status post hysterectomy, anxiety and insomnia, who was recently diagnosed with left breast cancer.  1. Left breast cancer, 3 synchronized lesions, 2 of them with lobular features and one has ductal features. T1b-1cN0M0, stage I, ER+/PR+/HER2- -I previously reviewed her breast mass biopsy findings and image findings with patient and her daughter extensively. She has 3 synchronized early stage breast cancer without clinical lymph nodes involvement. I discussed that surgical resection, likely mastectomy due to the location of this 3 masses, would be the only curative treatment for her breast cancer. She previously declined surgery repeatly.  - She has been tolerating anastrozole very well, we'll continue indefinitely if no surgery.  -Her repeated mammogram and ultrasound showed partial response after 2 year of treatment -I'll continue  diagnostic mammogram and [place] every 6  months for monitoring. -She notes stopping anastrozole in 08/2016 mistakenly and restarted on 01/05/17 and has been complaint since then  -Mammogram on 02/16/17 showed: No significant interval change in the appearance of the 3 sites of biopsy proven cancer in the left breast. No evidence of right breast malignancy. -I previously discussed the option of surgery, and strongly encouraged her to consider surgery. She is overall healthy for her age.  After lengthy discussion, she is open to surgery now.  Her daughter knows Dr. Barry Dienes, who has operated on elderly she knows well.  So daughter and the patient wants switch her surgeon to Dr. Barry Dienes. I sent a referral to Dr. Barry Dienes. -Pt saw Dr. Barry Dienes who offered surgery. Pt has decided to not undergo surgery and would just like to be watched and continue Anastrozole. I again discussed that there is a chance that her cancer grows and spreads outside the breasts. She voiced good understanding.  -I reviewed her recent mammogram from August 10, 2017, which showed a stable left breast mass, no new lesions.  We will continue monitoring every 6 months. -She is clinically doing well, no adenopathy on physical exam, left breast mass is stable and has not increased in size since my last exam. She will continue anastrozole -Labs reviewed today, CBC and CMP are WNL. Will monitor  2. Genetics -She has personal history of endometrial cancer, family history of breast cancer and pancreatic cancer. I previously discussed the genetic screening for inherited breast cancer and lynch syndrome, she declined at this point.   3. Arthritis  -She'll continue follow-up with her primary care physician and orthopedic surgeon -She will continue to follow-up with her primary care physician for other medical issues. -She gets gel shots in her left knee by her PCP and takes tumeric for her arthritis pain.  -She reports new hand swelling and mild pain in her joints. She has been taking Anastrozole  for 3 years so it is likely unrelated. I advised her to use Ibuprofen instead of Tylenol due to her elevated liver enzymes   4. Osteopenia - DEXA scan on 08/18/16 showed T score -1.4, low risk osteopenia - The patient will begin to take calcium and vitamin D daily to support bone health.  5. Transaminitis  -Her lab previously showed elevated liver enzymes. She had mild intermittent transaminitis in the past -will continue monitoring -LFTs normal today, normal liver exam   Plan -Continue Anastrozole  -F/u in 3 months with lab   I spent 20 minutes face to face with the patient and a total of 25 mins spent in counseling and/or coordination of care.  Dierdre Searles Dweik am acting as scribe for Dr. Truitt Merle.  I have reviewed the above documentation for accuracy and completeness, and I agree with the above.   Truitt Merle  08/12/2017

## 2017-08-12 ENCOUNTER — Inpatient Hospital Stay: Payer: Medicare Other | Attending: Hematology | Admitting: Hematology

## 2017-08-12 ENCOUNTER — Telehealth: Payer: Self-pay

## 2017-08-12 ENCOUNTER — Inpatient Hospital Stay: Payer: Medicare Other

## 2017-08-12 VITALS — BP 133/58 | HR 63 | Temp 98.0°F | Resp 17 | Ht 66.0 in | Wt 190.8 lb

## 2017-08-12 DIAGNOSIS — Z170 Estrogen receptor positive status [ER+]: Secondary | ICD-10-CM | POA: Diagnosis not present

## 2017-08-12 DIAGNOSIS — C50912 Malignant neoplasm of unspecified site of left female breast: Secondary | ICD-10-CM | POA: Diagnosis present

## 2017-08-12 DIAGNOSIS — Z79811 Long term (current) use of aromatase inhibitors: Secondary | ICD-10-CM | POA: Diagnosis not present

## 2017-08-12 DIAGNOSIS — M8589 Other specified disorders of bone density and structure, multiple sites: Secondary | ICD-10-CM | POA: Insufficient documentation

## 2017-08-12 LAB — COMPREHENSIVE METABOLIC PANEL
ALBUMIN: 3.3 g/dL — AB (ref 3.5–5.0)
ALT: 28 U/L (ref 0–44)
ANION GAP: 5 (ref 5–15)
AST: 32 U/L (ref 15–41)
Alkaline Phosphatase: 88 U/L (ref 38–126)
BILIRUBIN TOTAL: 0.5 mg/dL (ref 0.3–1.2)
BUN: 12 mg/dL (ref 8–23)
CALCIUM: 9.1 mg/dL (ref 8.9–10.3)
CO2: 30 mmol/L (ref 22–32)
Chloride: 104 mmol/L (ref 98–111)
Creatinine, Ser: 0.79 mg/dL (ref 0.44–1.00)
GFR calc Af Amer: >60 mL/min (ref >60)
GFR calc non Af Amer: >60 mL/min (ref >60)
GLUCOSE: 120 mg/dL — AB (ref 70–99)
Potassium: 4.2 mmol/L (ref 3.5–5.1)
SODIUM: 139 mmol/L (ref 135–145)
TOTAL PROTEIN: 7.5 g/dL (ref 6.5–8.1)

## 2017-08-12 LAB — CBC WITH DIFFERENTIAL/PLATELET
BASOS ABS: 0 10*3/uL (ref 0.0–0.1)
BASOS PCT: 1 %
Eosinophils Absolute: 0 10*3/uL (ref 0.0–0.5)
Eosinophils Relative: 1 %
HEMATOCRIT: 37.7 % (ref 34.8–46.6)
HEMOGLOBIN: 12.5 g/dL (ref 11.6–15.9)
Lymphocytes Relative: 27 %
Lymphs Abs: 1.1 10*3/uL (ref 0.9–3.3)
MCH: 30.9 pg (ref 25.1–34.0)
MCHC: 33.2 g/dL (ref 31.5–36.0)
MCV: 93.2 fL (ref 79.5–101.0)
MONO ABS: 0.4 10*3/uL (ref 0.1–0.9)
Monocytes Relative: 9 %
NEUTROS ABS: 2.5 10*3/uL (ref 1.5–6.5)
NEUTROS PCT: 62 %
Platelets: 207 10*3/uL (ref 145–400)
RBC: 4.04 MIL/uL (ref 3.70–5.45)
RDW: 14.1 % (ref 11.2–14.5)
WBC: 4 10*3/uL (ref 3.9–10.3)

## 2017-08-12 NOTE — Telephone Encounter (Signed)
Printed avs and calender of upcoming appointment. Per 7/3 los

## 2017-08-13 ENCOUNTER — Encounter: Payer: Self-pay | Admitting: Hematology

## 2017-08-17 ENCOUNTER — Other Ambulatory Visit: Payer: Medicare Other

## 2017-10-29 ENCOUNTER — Other Ambulatory Visit: Payer: Self-pay | Admitting: Hematology

## 2017-10-29 DIAGNOSIS — Z170 Estrogen receptor positive status [ER+]: Principal | ICD-10-CM

## 2017-10-29 DIAGNOSIS — C50912 Malignant neoplasm of unspecified site of left female breast: Secondary | ICD-10-CM

## 2017-11-11 ENCOUNTER — Other Ambulatory Visit: Payer: Self-pay | Admitting: Hematology

## 2017-11-11 ENCOUNTER — Inpatient Hospital Stay: Payer: Medicare Other | Attending: Hematology | Admitting: Hematology

## 2017-11-11 ENCOUNTER — Inpatient Hospital Stay: Payer: Medicare Other

## 2017-11-11 DIAGNOSIS — C50912 Malignant neoplasm of unspecified site of left female breast: Secondary | ICD-10-CM

## 2017-11-11 DIAGNOSIS — Z170 Estrogen receptor positive status [ER+]: Principal | ICD-10-CM

## 2017-11-18 ENCOUNTER — Encounter: Payer: Self-pay | Admitting: Internal Medicine

## 2017-11-18 ENCOUNTER — Other Ambulatory Visit (INDEPENDENT_AMBULATORY_CARE_PROVIDER_SITE_OTHER): Payer: Medicare Other

## 2017-11-18 ENCOUNTER — Ambulatory Visit (INDEPENDENT_AMBULATORY_CARE_PROVIDER_SITE_OTHER): Payer: Medicare Other | Admitting: Internal Medicine

## 2017-11-18 VITALS — BP 122/76 | HR 72 | Temp 97.9°F | Ht 66.0 in | Wt 188.0 lb

## 2017-11-18 DIAGNOSIS — R739 Hyperglycemia, unspecified: Secondary | ICD-10-CM

## 2017-11-18 DIAGNOSIS — Z0000 Encounter for general adult medical examination without abnormal findings: Secondary | ICD-10-CM

## 2017-11-18 LAB — HEMOGLOBIN A1C: Hgb A1c MFr Bld: 5.4 % (ref 4.6–6.5)

## 2017-11-18 LAB — LIPID PANEL
CHOL/HDL RATIO: 3
CHOLESTEROL: 195 mg/dL (ref 0–200)
HDL: 74.4 mg/dL (ref >39.00)
LDL Cholesterol: 108 mg/dL — ABNORMAL HIGH (ref 0–99)
NonHDL: 120.2
Triglycerides: 63 mg/dL (ref 0.0–149.0)
VLDL: 12.6 mg/dL (ref 0.0–40.0)

## 2017-11-18 LAB — URINALYSIS, ROUTINE W REFLEX MICROSCOPIC
HGB URINE DIPSTICK: NEGATIVE
Ketones, ur: NEGATIVE
Leukocytes, UA: NEGATIVE
NITRITE: NEGATIVE
PH: 6 (ref 5.0–8.0)
SPECIFIC GRAVITY, URINE: 1.025 (ref 1.000–1.030)
TOTAL PROTEIN, URINE-UPE24: NEGATIVE
UROBILINOGEN UA: 1 (ref 0.0–1.0)
Urine Glucose: NEGATIVE

## 2017-11-18 LAB — TSH: TSH: 1.54 u[IU]/mL (ref 0.35–4.50)

## 2017-11-18 MED ORDER — ALPRAZOLAM 0.5 MG PO TABS: 0.5000 mg | ORAL_TABLET | Freq: Every day | ORAL | 5 refills | Status: DC | PRN

## 2017-11-18 NOTE — Assessment & Plan Note (Signed)

## 2017-11-18 NOTE — Assessment & Plan Note (Signed)
stable overall by history and exam, recent data reviewed with pt, and pt to continue medical treatment as before,  to f/u any worsening symptoms or concerns  

## 2017-11-18 NOTE — Progress Notes (Signed)
Subjective:    Patient ID: Doris Zamora, female    DOB: 15-Aug-1930, 85 y.o.   MRN: [id number]  HPI  Here for wellness and f/u;  Overall doing ok;  Pt denies Chest pain, worsening SOB, DOE, wheezing, orthopnea, PND, worsening LE edema, palpitations, dizziness or syncope.  Pt denies neurological change such as new headache, facial or extremity weakness.  Pt denies polydipsia, polyuria, or low sugar symptoms. Pt states overall good compliance with treatment and medications, good tolerability, and has been trying to follow appropriate diet.  Pt denies worsening depressive symptoms, suicidal ideation or panic. No fever, night sweats, wt loss, loss of appetite, or other constitutional symptoms.  Pt states good ability with ADL's, has low fall risk, home safety reviewed and adequate, no other significant changes in hearing or vision, and only occasionally active with exercise.  Is Smoking rolled hemp lately she calls "medical marijuana" qhs for anxiety.  Due for flu shot.   Also with right lower back pain and plans to try biofreeze. Due for left knee TKR soon.  Had some urinary freq recently but Denies urinary symptoms such as dysuria, frequency, urgency, flank pain, hematuria or n/v, fever, chills. Past Medical History:  Diagnosis Date  . ANXIETY   . Endometrial cancer ([place]) 2007  . Hyperglycemia 04/20/2014  . HYPERLIPIDEMIA   . INSOMNIA-SLEEP DISORDER-UNSPEC   . WEIGHT LOSS    Past Surgical History:  Procedure Laterality Date  . ABDOMINAL HYSTERECTOMY  2007  . BREAST BIOPSY Left 01/26/2014   x3, malignant  . OOPHORECTOMY  2007  . THYROIDECTOMY, PARTIAL  1983    reports that she quit smoking about 23 years ago. Her smoking use included cigarettes. She quit after 50.00 years of use. She has never used smokeless tobacco. She reports that she drinks about 7.0 standard drinks of alcohol per week. She reports that she does not use drugs. family history includes Cancer (age of onset: 45) in her other;  Cancer (age of onset: 42) in her sister; Diabetes in her mother; Muscular dystrophy in her father. Allergies  Allergen Reactions  . Synthroid [Levothyroxine Sodium] Swelling    Happened yrs ago  . Pneumovax [Pneumococcal Polysaccharide Vaccine] Other (See Comments)    Patient stated,"I don't know if I'm allergic to this or not. I'm 84 years old."   Current Outpatient Medications on File Prior to Visit  Medication Sig Dispense Refill  . ALPRAZolam (XANAX) 0.5 MG tablet Take 1 tablet (0.5 mg total) by mouth daily as needed. 30 tablet 5  . anastrozole (ARIMIDEX) 1 MG tablet TAKE 1 TABLET(1 MG) BY MOUTH DAILY 30 tablet 0  . cetirizine (ZYRTEC) 10 MG tablet Take 1 tablet (10 mg total) by mouth daily. 30 tablet 11  . cholecalciferol (VITAMIN D) 1000 UNITS tablet Take 1,000 Units by mouth daily.     Marland Kitchen co-enzyme Q-10 30 MG capsule Take 100 mg by mouth daily.     . fluticasone (FLONASE) 50 MCG/ACT nasal spray Place 2 sprays into both nostrils daily. 16 g 2  . Multiple Vitamins-Minerals (MULTIVITAMIN WITH MINERALS) tablet Take 1 tablet by mouth daily. Forward Gold    . Omega-3 Fatty Acids (FISH OIL) 1000 MG CPDR Take 1 capsule by mouth daily.     Marland Kitchen OVER THE COUNTER MEDICATION Take 1 tablet by mouth at bedtime as needed. zzquil for sleep     . OVER THE COUNTER MEDICATION Take 2 mLs by mouth daily. Tumeric with folic acid 500mg /100mg   No current facility-administered medications on file prior to visit.    Review of Systems  Constitutional: Negative for other unusual diaphoresis or sweats HENT: Negative for ear discharge or swelling Eyes: Negative for other worsening visual disturbances Respiratory: Negative for stridor or other swelling  Gastrointestinal: Negative for worsening distension or other blood Genitourinary: Negative for retention or other urinary change Musculoskeletal: Negative for other MSK pain or swelling Skin: Negative for color change or other new lesions Neurological:  Negative for worsening tremors and other numbness  Psychiatric/Behavioral: Negative for worsening agitation or other fatigue All other system neg per pt    Objective:   Physical Exam BP 122/76   Pulse 72   Temp 97.9 F (36.6 C) (Oral)   Ht 5\' 6"  (1.676 m)   Wt 188 lb (85.3 kg)   SpO2 97%   BMI 30.34 kg/m  VS noted,  Constitutional: Pt appears in NAD HENT: Head: NCAT.  Right Ear: External ear normal.  Left Ear: External ear normal.  Eyes: . Pupils are equal, round, and reactive to light. Conjunctivae and EOM are normal Nose: without d/c or deformity Neck: Neck supple. Gross normal ROM Cardiovascular: Normal rate and regular rhythm.   Pulmonary/Chest: Effort normal and breath sounds without rales or wheezing.  Abd:  Soft, NT, ND, + BS, no organomegaly Neurological: Pt is alert. At baseline orientation, motor grossly intact Skin: Skin is warm. No rashes, other new lesions, no LE edema Psychiatric: Pt behavior is normal without agitation  No other exam findings Lab Results  Component Value Date   WBC 4.0 08/12/2017   HGB 12.5 08/12/2017   HCT 37.7 08/12/2017   PLT 207 08/12/2017   GLUCOSE 120 (H) 08/12/2017   CHOL 184 09/24/2016   TRIG 76.0 09/24/2016   HDL 73.00 09/24/2016   LDLDIRECT 129.4 02/14/2008   LDLCALC 95 09/24/2016   ALT 28 08/12/2017   AST 32 08/12/2017   NA 139 08/12/2017   K 4.2 08/12/2017   CL 104 08/12/2017   CREATININE 0.79 08/12/2017   BUN 12 08/12/2017   CO2 30 08/12/2017   TSH 1.83 09/24/2016   HGBA1C 5.6 09/24/2016        Assessment & Plan:

## 2017-11-18 NOTE — Patient Instructions (Addendum)

## 2017-11-27 ENCOUNTER — Ambulatory Visit: Payer: Medicare Other | Admitting: Internal Medicine

## 2017-12-04 ENCOUNTER — Other Ambulatory Visit: Payer: Self-pay | Admitting: Hematology

## 2017-12-04 DIAGNOSIS — Z170 Estrogen receptor positive status [ER+]: Principal | ICD-10-CM

## 2017-12-04 DIAGNOSIS — C50912 Malignant neoplasm of unspecified site of left female breast: Secondary | ICD-10-CM

## 2018-01-09 ENCOUNTER — Other Ambulatory Visit: Payer: Self-pay | Admitting: Hematology

## 2018-01-09 DIAGNOSIS — C50912 Malignant neoplasm of unspecified site of left female breast: Secondary | ICD-10-CM

## 2018-01-09 DIAGNOSIS — Z170 Estrogen receptor positive status [ER+]: Principal | ICD-10-CM

## 2018-01-25 ENCOUNTER — Ambulatory Visit: Payer: Self-pay

## 2018-01-25 NOTE — Telephone Encounter (Signed)
Pt daughter called to state that her mother has had swelling to her ankles for 2 weeks.. She states that her mother has been elevating the feet when possible but it has not helped.  Mother denies pain.  She has no hx of heart disease.  She had no hx of hypertension. Appointment scheduled per protocol.  Care advice read to patient. Pt verbalized understanding of all instructions.  Reason for Disposition . [1] MODERATE pain (e.g., interferes with normal activities, limping) AND [2] present > 3 days  Answer Assessment - Initial Assessment Questions 1. LOCATION: "Which joint is swollen?"     Both ankles 2. ONSET: "When did the swelling start?"     2 weeks ago 3. SIZE: "How large is the swelling?"     mild 4. PAIN: "Is there any pain?" If so, ask: "How bad is it?" (Scale 1-10; or mild, moderate, severe)     no 5. CAUSE: "What do you think caused the swollen joint?"     no 6. OTHER SYMPTOMS: "Do you have any other symptoms?" (e.g., fever, chest pain, difficulty breathing, calf pain)     no 7. PREGNANCY: "Is there any chance you are pregnant?" "When was your last menstrual period?"     N/A  Protocols used: ANKLE SWELLING-A-AH

## 2018-01-26 ENCOUNTER — Encounter: Payer: Self-pay | Admitting: Family

## 2018-01-26 ENCOUNTER — Ambulatory Visit (INDEPENDENT_AMBULATORY_CARE_PROVIDER_SITE_OTHER): Payer: Medicare Other | Admitting: Family

## 2018-01-26 ENCOUNTER — Ambulatory Visit (INDEPENDENT_AMBULATORY_CARE_PROVIDER_SITE_OTHER)
Admission: RE | Admit: 2018-01-26 | Discharge: 2018-01-26 | Disposition: A | Discharge status: Home / Self Care | Payer: Medicare Other | Source: Ambulatory Visit | Attending: Family | Admitting: Family

## 2018-01-26 ENCOUNTER — Other Ambulatory Visit (INDEPENDENT_AMBULATORY_CARE_PROVIDER_SITE_OTHER): Payer: Medicare Other

## 2018-01-26 VITALS — BP 126/60 | HR 62 | Temp 97.5°F | Ht 66.0 in | Wt 195.8 lb

## 2018-01-26 DIAGNOSIS — R600 Localized edema: Secondary | ICD-10-CM | POA: Diagnosis not present

## 2018-01-26 LAB — COMPREHENSIVE METABOLIC PANEL
ALBUMIN: 3.6 g/dL (ref 3.5–5.2)
ALT: 26 U/L (ref 0–35)
AST: 34 U/L (ref 0–37)
Alkaline Phosphatase: 84 U/L (ref 39–117)
BILIRUBIN TOTAL: 0.5 mg/dL (ref 0.2–1.2)
BUN: 13 mg/dL (ref 6–23)
CALCIUM: 9.5 mg/dL (ref 8.4–10.5)
CO2: 27 mEq/L (ref 19–32)
Chloride: 103 mEq/L (ref 96–112)
Creatinine, Ser: 0.61 mg/dL (ref 0.40–1.20)
GFR: 119.7 mL/min (ref >60.00)
Glucose, Bld: 88 mg/dL (ref 70–99)
Potassium: 3.9 mEq/L (ref 3.5–5.1)
Sodium: 137 mEq/L (ref 135–145)
Total Protein: 8 g/dL (ref 6.0–8.3)

## 2018-01-26 LAB — CBC WITH DIFFERENTIAL/PLATELET
BASOS ABS: 0.1 10*3/uL (ref 0.0–0.1)
Basophils Relative: 1.2 % (ref 0.0–3.0)
EOS ABS: 0 10*3/uL (ref 0.0–0.7)
Eosinophils Relative: 0.9 % (ref 0.0–5.0)
HEMATOCRIT: 40.3 % (ref 36.0–46.0)
HEMOGLOBIN: 13.2 g/dL (ref 12.0–15.0)
LYMPHS PCT: 29.2 % (ref 12.0–46.0)
Lymphs Abs: 1.4 10*3/uL (ref 0.7–4.0)
MCHC: 32.8 g/dL (ref 30.0–36.0)
MCV: 93.4 fl (ref 78.0–100.0)
Monocytes Absolute: 0.5 10*3/uL (ref 0.1–1.0)
Monocytes Relative: 10.1 % (ref 3.0–12.0)
Neutro Abs: 2.7 10*3/uL (ref 1.4–7.7)
Neutrophils Relative %: 58.6 % (ref 43.0–77.0)
Platelets: 232 10*3/uL (ref 150.0–400.0)
RBC: 4.32 Mil/uL (ref 3.87–5.11)
RDW: 13.7 % (ref 11.5–15.5)
WBC: 4.6 10*3/uL (ref 4.0–10.5)

## 2018-01-26 LAB — D-DIMER, QUANTITATIVE: D-Dimer, Quant: 1.22 mcg/mL FEU — ABNORMAL HIGH (ref <0.50)

## 2018-01-26 LAB — BRAIN NATRIURETIC PEPTIDE: Pro B Natriuretic peptide (BNP): 103 pg/mL — ABNORMAL HIGH (ref 0.0–100.0)

## 2018-01-26 MED ORDER — FUROSEMIDE 20 MG PO TABS: ORAL_TABLET | ORAL | 0 refills | Status: DC

## 2018-01-26 MED ORDER — POTASSIUM CHLORIDE ER 10 MEQ PO TBCR: 10.0000 meq | EXTENDED_RELEASE_TABLET | Freq: Every day | ORAL | 0 refills | Status: DC

## 2018-01-26 NOTE — Progress Notes (Signed)
Doris Zamora is a 84 y.o. female with the following history as recorded in [employer]:  Patient Active Problem List   Diagnosis Date Noted  . Eustachian tube dysfunction, bilateral 06/06/2017  . Bilateral knee pain 09/27/2016  . Right shoulder pain 09/27/2016  . Leg cramps 02/15/2016  . Gait disorder 05/03/2015  . Allergic rhinitis 04/20/2014  . Hyperglycemia 04/20/2014  . Breast cancer, left breast ([place]) 02/08/2014  . Lower back pain 09/21/2013  . Primary localized osteoarthrosis, lower leg 06/14/2013  . Endometrial cancer ([place])   . Abnormal CT scan 06/17/2012  . Preventative health care 01/11/2012  . Obesity (BMI 30.0-34.9) 02/14/2008  . Hyperlipidemia 08/11/2007  . Anxiety state 08/11/2007  . INSOMNIA-SLEEP DISORDER-UNSPEC 08/11/2007    Current Outpatient Medications  Medication Sig Dispense Refill  . ALPRAZolam (XANAX) 0.5 MG tablet Take 1 tablet (0.5 mg total) by mouth daily as needed. 30 tablet 5  . anastrozole (ARIMIDEX) 1 MG tablet TAKE 1 TABLET(1 MG) BY MOUTH DAILY 30 tablet 0  . cetirizine (ZYRTEC) 10 MG tablet Take 1 tablet (10 mg total) by mouth daily. 30 tablet 11  . cholecalciferol (VITAMIN D) 1000 UNITS tablet Take 1,000 Units by mouth daily.     Marland Kitchen co-enzyme Q-10 30 MG capsule Take 100 mg by mouth daily.     . fluticasone (FLONASE) 50 MCG/ACT nasal spray Place 2 sprays into both nostrils daily. 16 g 2  . Multiple Vitamins-Minerals (MULTIVITAMIN WITH MINERALS) tablet Take 1 tablet by mouth daily. Forward Gold    . Omega-3 Fatty Acids (FISH OIL) 1000 MG CPDR Take 1 capsule by mouth daily.     Marland Kitchen OVER THE COUNTER MEDICATION Take 1 tablet by mouth at bedtime as needed. zzquil for sleep     . OVER THE COUNTER MEDICATION Take 2 mLs by mouth daily. Tumeric with folic acid 423NT/614ER    . furosemide (LASIX) 20 MG tablet 1/2- 1 tablet daily as needed for swelling 30 tablet 0  . potassium chloride (K-DUR) 10 MEQ tablet Take 1 tablet (10 mEq total) by mouth daily. As needed  with use of Lasix 30 tablet 0   No current facility-administered medications for this visit.     Allergies: Synthroid [levothyroxine sodium] and Pneumovax [pneumococcal polysaccharide vaccine]  Past Medical History:  Diagnosis Date  . ANXIETY   . Endometrial cancer ([place]) 2007  . Hyperglycemia 04/20/2014  . HYPERLIPIDEMIA   . INSOMNIA-SLEEP DISORDER-UNSPEC   . WEIGHT LOSS     Past Surgical History:  Procedure Laterality Date  . ABDOMINAL HYSTERECTOMY  2007  . BREAST BIOPSY Left 01/26/2014   x3, malignant  . OOPHORECTOMY  2007  . THYROIDECTOMY, PARTIAL  1983    Family History  Problem Relation Age of Onset  . Diabetes Mother   . Muscular dystrophy Father   . Cancer Other 40       breast cancer   . Cancer Sister 16       pancreatic cancer     Social History   Tobacco Use  . Smoking status: Former Smoker    Years: 50.00    Types: Cigarettes    Last attempt to quit: 02/10/1994    Years since quitting: 23.9  . Smokeless tobacco: Never Used  Substance Use Topics  . Alcohol use: Yes    Alcohol/week: 7.0 standard drinks    Types: 7 Glasses of wine per week    Comment: 2 0z. HS to help her sleep    Subjective:  2 week history  of bilateral swelling in her ankles; accompanied by her daughter today; no chest pain, no shortness of breath; no changes in bowel movements or abdominal pain; no recent travel; no increased walking recently; no pain in either leg; no prior history of heart failure or blood clots; has been on stable dose of Arimidex "for a while." Used alcohol and Advil yesterday with good relief; legs do look better in the morning and worsen as day progresses.    Objective:  Vitals:   01/26/18 1007  BP: 126/60  Pulse: 62  Temp: (!) 97.5 F (36.4 C)  TempSrc: Oral  SpO2: 98%  Weight: 195 lb 12.8 oz (88.8 kg)  Height: 5' 6"  (1.676 m)    General: Well developed, well nourished, in no acute distress  Skin : Warm and dry.  Head: Normocephalic and atraumatic   Eyes: Sclera and conjunctiva clear; pupils round and reactive to light; extraocular movements intact  Ears: External normal; canals clear; tympanic membranes normal  Oropharynx: Pink, supple. No suspicious lesions  Neck: Supple without thyromegaly, adenopathy  Lungs: Respirations unlabored; clear to auscultation bilaterally without wheeze, rales, rhonchi  CVS exam: normal rate and regular rhythm.  Abdomen: Soft; nontender; nondistended; normoactive bowel sounds; no masses or hepatosplenomegaly  Musculoskeletal: No deformities; no active joint inflammation  Extremities: No edema, cyanosis, clubbing  Vessels: Symmetric bilaterally  Neurologic: Alert and oriented; speech intact; face symmetrical; moves all extremities well; CNII-XII intact without focal deficit   Assessment:  1. Pedal edema     Plan:  ? Source; encouraged to try and elevate feet as much as possible/walk some everyday as tolerated; will update CBC, CMP, D-dimer, BNP and CXR today; trial of Lasix 20 mg 1/2- 1 tablet daily as needed until swelling resolves; follow-up to be determined.   No follow-ups on file.  Orders Placed This Encounter  Procedures  . DG Chest 2 View    Standing Status:   Future    Number of Occurrences:   1    Standing Expiration Date:   03/30/2019    Order Specific Question:   Reason for Exam (SYMPTOM  OR DIAGNOSIS REQUIRED)    Answer:   pedal edema    Order Specific Question:   Preferred imaging location?    Answer:   [facility]    Order Specific Question:   Radiology Contrast Protocol - do NOT remove file path    Answer:   [email or web address]  . B Nat Peptide    Standing Status:   Future    Number of Occurrences:   1    Standing Expiration Date:   01/26/2019  . CBC w/Diff    Standing Status:   Future    Number of Occurrences:   1    Standing Expiration Date:   01/26/2019  . Comp Met (CMET)    Standing Status:   Future    Number of Occurrences:   1     Standing Expiration Date:   01/26/2019  . D-Dimer, Quantitative    Standing Status:   Future    Number of Occurrences:   1    Standing Expiration Date:   01/26/2019    Requested Prescriptions   Signed Prescriptions Disp Refills  . furosemide (LASIX) 20 MG tablet 30 tablet 0    Sig: 1/2- 1 tablet daily as needed for swelling  . potassium chloride (K-DUR) 10 MEQ tablet 30 tablet 0    Sig: Take 1 tablet (10 mEq total) by mouth daily. As needed with  use of Lasix

## 2018-01-27 ENCOUNTER — Other Ambulatory Visit: Payer: Self-pay | Admitting: Family

## 2018-01-27 DIAGNOSIS — R600 Localized edema: Secondary | ICD-10-CM

## 2018-01-27 DIAGNOSIS — R7989 Other specified abnormal findings of blood chemistry: Secondary | ICD-10-CM

## 2018-01-28 ENCOUNTER — Ambulatory Visit (HOSPITAL_COMMUNITY)
Admission: RE | Admit: 2018-01-28 | Discharge: 2018-01-28 | Disposition: A | Discharge status: Home / Self Care | Payer: Medicare Other | Source: Ambulatory Visit | Attending: Cardiology | Admitting: Cardiology

## 2018-01-28 DIAGNOSIS — R7989 Other specified abnormal findings of blood chemistry: Secondary | ICD-10-CM | POA: Diagnosis present

## 2018-01-28 DIAGNOSIS — R600 Localized edema: Secondary | ICD-10-CM

## 2018-02-09 ENCOUNTER — Other Ambulatory Visit: Payer: Self-pay | Admitting: Hematology

## 2018-02-09 DIAGNOSIS — C50912 Malignant neoplasm of unspecified site of left female breast: Secondary | ICD-10-CM

## 2018-02-09 DIAGNOSIS — Z170 Estrogen receptor positive status [ER+]: Principal | ICD-10-CM

## 2018-03-14 ENCOUNTER — Other Ambulatory Visit: Payer: Self-pay | Admitting: Hematology

## 2018-03-14 DIAGNOSIS — C50912 Malignant neoplasm of unspecified site of left female breast: Secondary | ICD-10-CM

## 2018-03-14 DIAGNOSIS — Z170 Estrogen receptor positive status [ER+]: Principal | ICD-10-CM

## 2018-03-17 ENCOUNTER — Encounter: Payer: Self-pay | Admitting: Internal Medicine

## 2018-03-17 ENCOUNTER — Ambulatory Visit (INDEPENDENT_AMBULATORY_CARE_PROVIDER_SITE_OTHER): Payer: Medicare Other | Admitting: Internal Medicine

## 2018-03-17 VITALS — BP 122/68 | HR 74 | Temp 97.6°F | Ht 66.0 in | Wt 192.0 lb

## 2018-03-17 DIAGNOSIS — R739 Hyperglycemia, unspecified: Secondary | ICD-10-CM | POA: Diagnosis not present

## 2018-03-17 DIAGNOSIS — I872 Venous insufficiency (chronic) (peripheral): Secondary | ICD-10-CM

## 2018-03-17 DIAGNOSIS — E785 Hyperlipidemia, unspecified: Secondary | ICD-10-CM | POA: Diagnosis not present

## 2018-03-17 MED ORDER — FUROSEMIDE 20 MG PO TABS: ORAL_TABLET | ORAL | 5 refills | Status: DC

## 2018-03-17 MED ORDER — POTASSIUM CHLORIDE ER 10 MEQ PO TBCR: 10.0000 meq | EXTENDED_RELEASE_TABLET | Freq: Every day | ORAL | 5 refills | Status: DC

## 2018-03-17 NOTE — Assessment & Plan Note (Signed)
stable overall by history and exam, recent data reviewed with pt, and pt to continue medical treatment as before,  to f/u any worsening symptoms or concerns  

## 2018-03-17 NOTE — Assessment & Plan Note (Signed)
D/w pt and duaghter CNA who lives with her; ok for compression stockings, leg elevation, low salt diet, and wt conrol, as well as cont prn lasix/K for persistent swelling

## 2018-03-17 NOTE — Patient Instructions (Signed)
Please continue all other medications as before, and refills have been done if requested - the lasix and potassium  Please have the pharmacy call with any other refills you may need.  Please continue your efforts at being more active, low cholesterol diet, and weight control.  Please keep your appointments with your specialists as you may have planned

## 2018-03-17 NOTE — Assessment & Plan Note (Signed)
Lab Results  Component Value Date   LDLCALC 108 (H) 11/18/2017  stable overall by history and exam, recent data reviewed with pt, and pt to continue medical treatment as before,  to f/u any worsening symptoms or concerns, for lower chol diet

## 2018-03-17 NOTE — Progress Notes (Signed)
Subjective:    Patient ID: Doris Zamora, female    DOB: 1930-12-25, 85 y.o.   MRN: [id number]  HPI  Here with daughter to f/u persistent mild bilat LE edema, and Pt denies chest pain, increased sob or doe, wheezing, orthopnea, PND, palpitations, dizziness or syncope. Was seen recently and tx with low dose lasix and K with some improvement.  BNP and labs essentially normal.  Pt denies new neurological symptoms such as new headache, or facial or extremity weakness or numbness   Pt denies polydipsia, polyuria. Past Medical History:  Diagnosis Date  . ANXIETY   . Endometrial cancer ([place]) 2007  . Hyperglycemia 04/20/2014  . HYPERLIPIDEMIA   . INSOMNIA-SLEEP DISORDER-UNSPEC   . WEIGHT LOSS    Past Surgical History:  Procedure Laterality Date  . ABDOMINAL HYSTERECTOMY  2007  . BREAST BIOPSY Left 01/26/2014   x3, malignant  . OOPHORECTOMY  2007  . THYROIDECTOMY, PARTIAL  1983    reports that she quit smoking about 24 years ago. Her smoking use included cigarettes. She quit after 50.00 years of use. She has never used smokeless tobacco. She reports current alcohol use of about 7.0 standard drinks of alcohol per week. She reports that she does not use drugs. family history includes Cancer (age of onset: 3) in an other family member; Cancer (age of onset: 59) in her sister; Diabetes in her mother; Muscular dystrophy in her father. Allergies  Allergen Reactions  . Synthroid [Levothyroxine Sodium] Swelling    Happened yrs ago  . Pneumovax [Pneumococcal Polysaccharide Vaccine] Other (See Comments)    Patient stated,"I don't know if I'm allergic to this or not. I'm 85 years old."   Current Outpatient Medications on File Prior to Visit  Medication Sig Dispense Refill  . ALPRAZolam (XANAX) 0.5 MG tablet Take 1 tablet (0.5 mg total) by mouth daily as needed. 30 tablet 5  . anastrozole (ARIMIDEX) 1 MG tablet TAKE 1 TABLET(1 MG) BY MOUTH DAILY 30 tablet 0  . cetirizine (ZYRTEC) 10 MG tablet Take 1  tablet (10 mg total) by mouth daily. 30 tablet 11  . cholecalciferol (VITAMIN D) 1000 UNITS tablet Take 1,000 Units by mouth daily.     Marland Kitchen co-enzyme Q-10 30 MG capsule Take 100 mg by mouth daily.     . fluticasone (FLONASE) 50 MCG/ACT nasal spray Place 2 sprays into both nostrils daily. 16 g 2  . Multiple Vitamins-Minerals (MULTIVITAMIN WITH MINERALS) tablet Take 1 tablet by mouth daily. Forward Gold    . Omega-3 Fatty Acids (FISH OIL) 1000 MG CPDR Take 1 capsule by mouth daily.     Marland Kitchen OVER THE COUNTER MEDICATION Take 1 tablet by mouth at bedtime as needed. zzquil for sleep     . OVER THE COUNTER MEDICATION Take 2 mLs by mouth daily. Tumeric with folic acid 500mg /100mg      No current facility-administered medications on file prior to visit.    Review of Systems  Constitutional: Negative for other unusual diaphoresis or sweats HENT: Negative for ear discharge or swelling Eyes: Negative for other worsening visual disturbances Respiratory: Negative for stridor or other swelling  Gastrointestinal: Negative for worsening distension or other blood Genitourinary: Negative for retention or other urinary change Musculoskeletal: Negative for other MSK pain or swelling Skin: Negative for color change or other new lesions Neurological: Negative for worsening tremors and other numbness  Psychiatric/Behavioral: Negative for worsening agitation or other fatigue All other system neg per pt    Objective:  Physical Exam BP 122/68   Pulse 74   Temp 97.6 F (36.4 C)   Ht 5\' 6"  (1.676 m)   Wt 192 lb (87.1 kg)   SpO2 96%   BMI 30.99 kg/m  VS noted,  Constitutional: Pt appears in NAD HENT: Head: NCAT.  Right Ear: External ear normal.  Left Ear: External ear normal.  Eyes: . Pupils are equal, round, and reactive to light. Conjunctivae and EOM are normal Nose: without d/c or deformity Neck: Neck supple. Gross normal ROM Cardiovascular: Normal rate and regular rhythm.   Pulmonary/Chest: Effort  normal and breath sounds without rales or wheezing.  Abd:  Soft, NT, ND, + BS, no organomegaly Neurological: Pt is alert. At baseline orientation, motor grossly intact Skin: Skin is warm. No rashes, other new lesions, trace bilat LE edema Psychiatric: Pt behavior is normal without agitation  No other exam findings Lab Results  Component Value Date   WBC 4.6 01/26/2018   HGB 13.2 01/26/2018   HCT 40.3 01/26/2018   PLT 232.0 01/26/2018   GLUCOSE 88 01/26/2018   CHOL 195 11/18/2017   TRIG 63.0 11/18/2017   HDL 74.40 11/18/2017   LDLDIRECT 129.4 02/14/2008   LDLCALC 108 (H) 11/18/2017   ALT 26 01/26/2018   AST 34 01/26/2018   NA 137 01/26/2018   K 3.9 01/26/2018   CL 103 01/26/2018   CREATININE 0.61 01/26/2018   BUN 13 01/26/2018   CO2 27 01/26/2018   TSH 1.54 11/18/2017   HGBA1C 5.4 11/18/2017       Assessment & Plan:

## 2018-04-09 ENCOUNTER — Other Ambulatory Visit: Payer: Self-pay | Admitting: Hematology

## 2018-04-09 DIAGNOSIS — Z170 Estrogen receptor positive status [ER+]: Principal | ICD-10-CM

## 2018-04-09 DIAGNOSIS — C50912 Malignant neoplasm of unspecified site of left female breast: Secondary | ICD-10-CM

## 2018-04-16 ENCOUNTER — Encounter: Payer: Self-pay | Admitting: Internal Medicine

## 2018-04-16 ENCOUNTER — Ambulatory Visit (INDEPENDENT_AMBULATORY_CARE_PROVIDER_SITE_OTHER): Payer: Medicare Other | Admitting: Internal Medicine

## 2018-04-16 DIAGNOSIS — R739 Hyperglycemia, unspecified: Secondary | ICD-10-CM | POA: Diagnosis not present

## 2018-04-16 DIAGNOSIS — H9193 Unspecified hearing loss, bilateral: Secondary | ICD-10-CM | POA: Diagnosis not present

## 2018-04-16 DIAGNOSIS — H9190 Unspecified hearing loss, unspecified ear: Secondary | ICD-10-CM | POA: Insufficient documentation

## 2018-04-16 DIAGNOSIS — J309 Allergic rhinitis, unspecified: Secondary | ICD-10-CM | POA: Diagnosis not present

## 2018-04-16 NOTE — Assessment & Plan Note (Signed)
stable overall by history and exam, recent data reviewed with pt, and pt to continue medical treatment as before,  to f/u any worsening symptoms or concerns  

## 2018-04-16 NOTE — Progress Notes (Signed)
Subjective:    Patient ID: Doris Zamora, female    DOB: [birth year], 85 y.o.   MRN: [id number]  HPI  Here to f/u with worsening 6 mo hearing loss, mostly higher frequencies it seems, now having to turn up the TV so loud the family is complaining.,  No recurrent wax issues, Denies HA, fever, worsening allergy or URI symptoms, ST, cough and Pt denies chest pain, increased sob or doe, wheezing, orthopnea, PND, increased LE swelling, palpitations, dizziness or syncope  Has baseline right> left small swelling to legs no change.   Pt denies fever, wt loss, night sweats, loss of appetite, or other constitutional symptoms  Pt denies new neurological symptoms such as new headache, or facial or extremity weakness or numbness   Pt denies polydipsia, polyuria, Past Medical History:  Diagnosis Date  . ANXIETY   . Endometrial cancer ([place]) 2007  . Hyperglycemia 04/20/2014  . HYPERLIPIDEMIA   . INSOMNIA-SLEEP DISORDER-UNSPEC   . WEIGHT LOSS    Past Surgical History:  Procedure Laterality Date  . ABDOMINAL HYSTERECTOMY  2007  . BREAST BIOPSY Left 01/26/2014   x3, malignant  . OOPHORECTOMY  2007  . THYROIDECTOMY, PARTIAL  1983    reports that she quit smoking about 24 years ago. Her smoking use included cigarettes. She quit after 50.00 years of use. She has never used smokeless tobacco. She reports current alcohol use of about 7.0 standard drinks of alcohol per week. She reports that she does not use drugs. family history includes Cancer (age of onset: 18) in an other family member; Cancer (age of onset: 28) in her sister; Diabetes in her mother; Muscular dystrophy in her father. Allergies  Allergen Reactions  . Synthroid [Levothyroxine Sodium] Swelling    Happened yrs ago  . Pneumovax [Pneumococcal Polysaccharide Vaccine] Other (See Comments)    Patient stated,"I don't know if I'm allergic to this or not. I'm 85 years old."   Current Outpatient Medications on File Prior to Visit  Medication Sig  Dispense Refill  . ALPRAZolam (XANAX) 0.5 MG tablet Take 1 tablet (0.5 mg total) by mouth daily as needed. 30 tablet 5  . anastrozole (ARIMIDEX) 1 MG tablet TAKE 1 TABLET(1 MG) BY MOUTH DAILY 30 tablet 3  . cetirizine (ZYRTEC) 10 MG tablet Take 1 tablet (10 mg total) by mouth daily. 30 tablet 11  . cholecalciferol (VITAMIN D) 1000 UNITS tablet Take 1,000 Units by mouth daily.     Marland Kitchen co-enzyme Q-10 30 MG capsule Take 100 mg by mouth daily.     . fluticasone (FLONASE) 50 MCG/ACT nasal spray Place 2 sprays into both nostrils daily. 16 g 2  . furosemide (LASIX) 20 MG tablet 1/2- 1 tablet daily as needed for swelling 30 tablet 5  . Multiple Vitamins-Minerals (MULTIVITAMIN WITH MINERALS) tablet Take 1 tablet by mouth daily. Forward Gold    . Omega-3 Fatty Acids (FISH OIL) 1000 MG CPDR Take 1 capsule by mouth daily.     Marland Kitchen OVER THE COUNTER MEDICATION Take 1 tablet by mouth at bedtime as needed. zzquil for sleep     . OVER THE COUNTER MEDICATION Take 2 mLs by mouth daily. Tumeric with folic acid 500mg /100mg     . potassium chloride (K-DUR) 10 MEQ tablet Take 1 tablet (10 mEq total) by mouth daily. As needed with use of Lasix 30 tablet 5   No current facility-administered medications on file prior to visit.    Review of Systems   Constitutional: Negative for other unusual  diaphoresis or sweats HENT: Negative for ear discharge or swelling Eyes: Negative for other worsening visual disturbances Respiratory: Negative for stridor or other swelling  Gastrointestinal: Negative for worsening distension or other blood Genitourinary: Negative for retention or other urinary change Musculoskeletal: Negative for other MSK pain or swelling Skin: Negative for color change or other new lesions Neurological: Negative for worsening tremors and other numbness  Psychiatric/Behavioral: Negative for worsening agitation or other fatigue All otherwise neg per pt     Objective:   Physical Exam BP 114/74   Pulse 78    Temp 97.9 F (36.6 C) (Oral)   Ht 5\' 6"  (1.676 m)   Wt 197 lb (89.4 kg)   SpO2 93%   BMI 31.80 kg/m  VS noted,  Constitutional: Pt appears in NAD HENT: Head: NCAT.  Right Ear: External ear normal.  Left Ear: External ear normal.  Eyes: . Pupils are equal, round, and reactive to light. Conjunctivae and EOM are normal Nose: without d/c or deformity Neck: Neck supple. Gross normal ROM Cardiovascular: Normal rate and regular rhythm.   Pulmonary/Chest: Effort normal and breath sounds without rales or wheezing.  Neurological: Pt is alert. At baseline orientation, motor grossly intact Skin: Skin is warm. No rashes, other new lesions, trace bilat LE edema Psychiatric: Pt behavior is normal without agitation  No  Other exam findings Lab Results  Component Value Date   WBC 4.6 01/26/2018   HGB 13.2 01/26/2018   HCT 40.3 01/26/2018   PLT 232.0 01/26/2018   GLUCOSE 88 01/26/2018   CHOL 195 11/18/2017   TRIG 63.0 11/18/2017   HDL 74.40 11/18/2017   LDLDIRECT 129.4 02/14/2008   LDLCALC 108 (H) 11/18/2017   ALT 26 01/26/2018   AST 34 01/26/2018   NA 137 01/26/2018   K 3.9 01/26/2018   CL 103 01/26/2018   CREATININE 0.61 01/26/2018   BUN 13 01/26/2018   CO2 27 01/26/2018   TSH 1.54 11/18/2017   HGBA1C 5.4 11/18/2017       Assessment & Plan:

## 2018-04-16 NOTE — Assessment & Plan Note (Signed)
No wax impaction or other overt cause, liekly age related sensorineural hearing loss, for audiology referral as will need hearing aids

## 2018-04-16 NOTE — Patient Instructions (Signed)
You will be contacted regarding the referral for: Audiology for hearing testing and probable hearing aids  Please continue all other medications as before, and refills have been done if requested.  Please have the pharmacy call with any other refills you may need.  Please continue your efforts at being more active, low cholesterol diet, and weight control.  Please keep your appointments with your specialists as you may have planned

## 2018-05-21 ENCOUNTER — Other Ambulatory Visit: Payer: Self-pay | Admitting: Internal Medicine

## 2018-05-24 NOTE — Telephone Encounter (Signed)
Done erx 

## 2018-06-01 ENCOUNTER — Other Ambulatory Visit: Payer: Self-pay | Admitting: Internal Medicine

## 2018-06-02 NOTE — Telephone Encounter (Signed)
Too soon for xanax as was just done apr 13

## 2018-06-22 ENCOUNTER — Other Ambulatory Visit: Payer: Self-pay | Admitting: Family

## 2018-08-02 IMAGING — DX DG KNEE AP/LAT W/ SUNRISE*L*
3 series · 3 of 3 positions shown · non-contrast
Comparison: Bilateral knees dated 06/14/2013.

CLINICAL DATA: Left knee pain for the past 3-4 months. No known
injury.

EXAM:
LEFT KNEE 3 VIEWS

[dg knee ap/lat w/ sunrise left (1 of 3)]
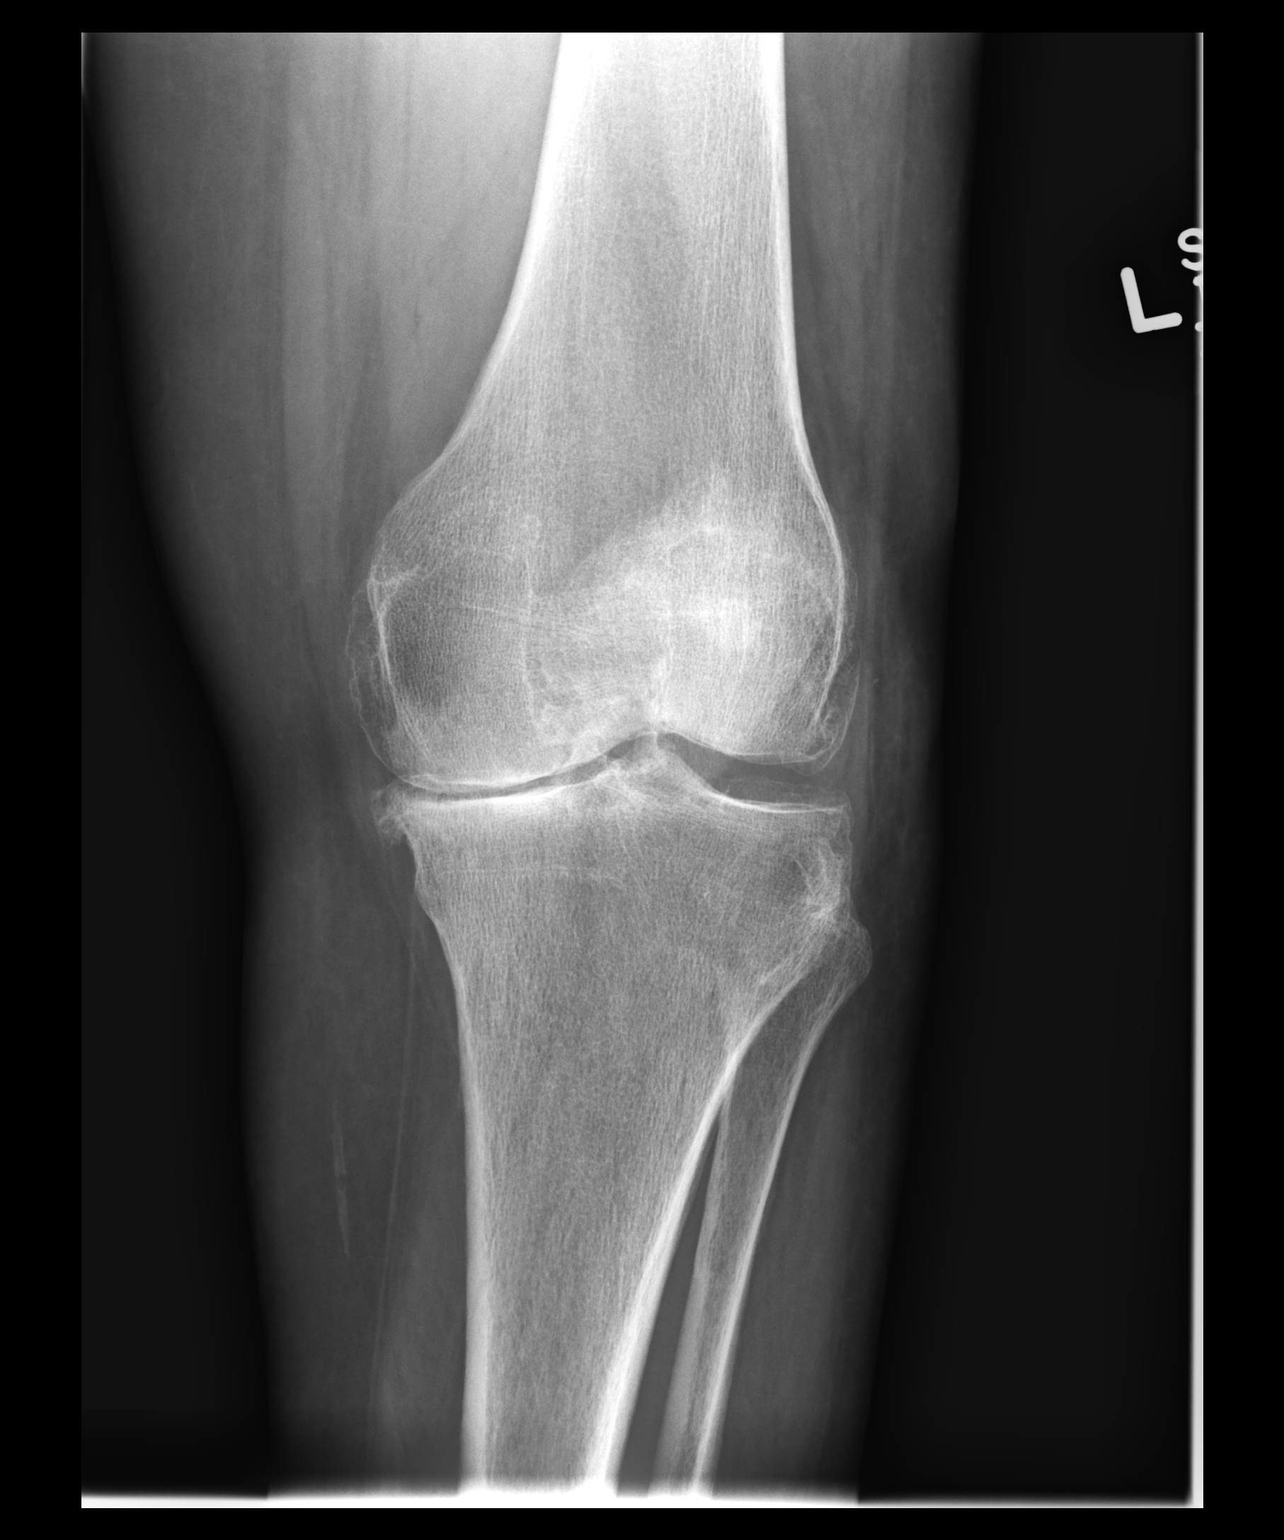

[dg knee ap/lat w/ sunrise left (2 of 3)]
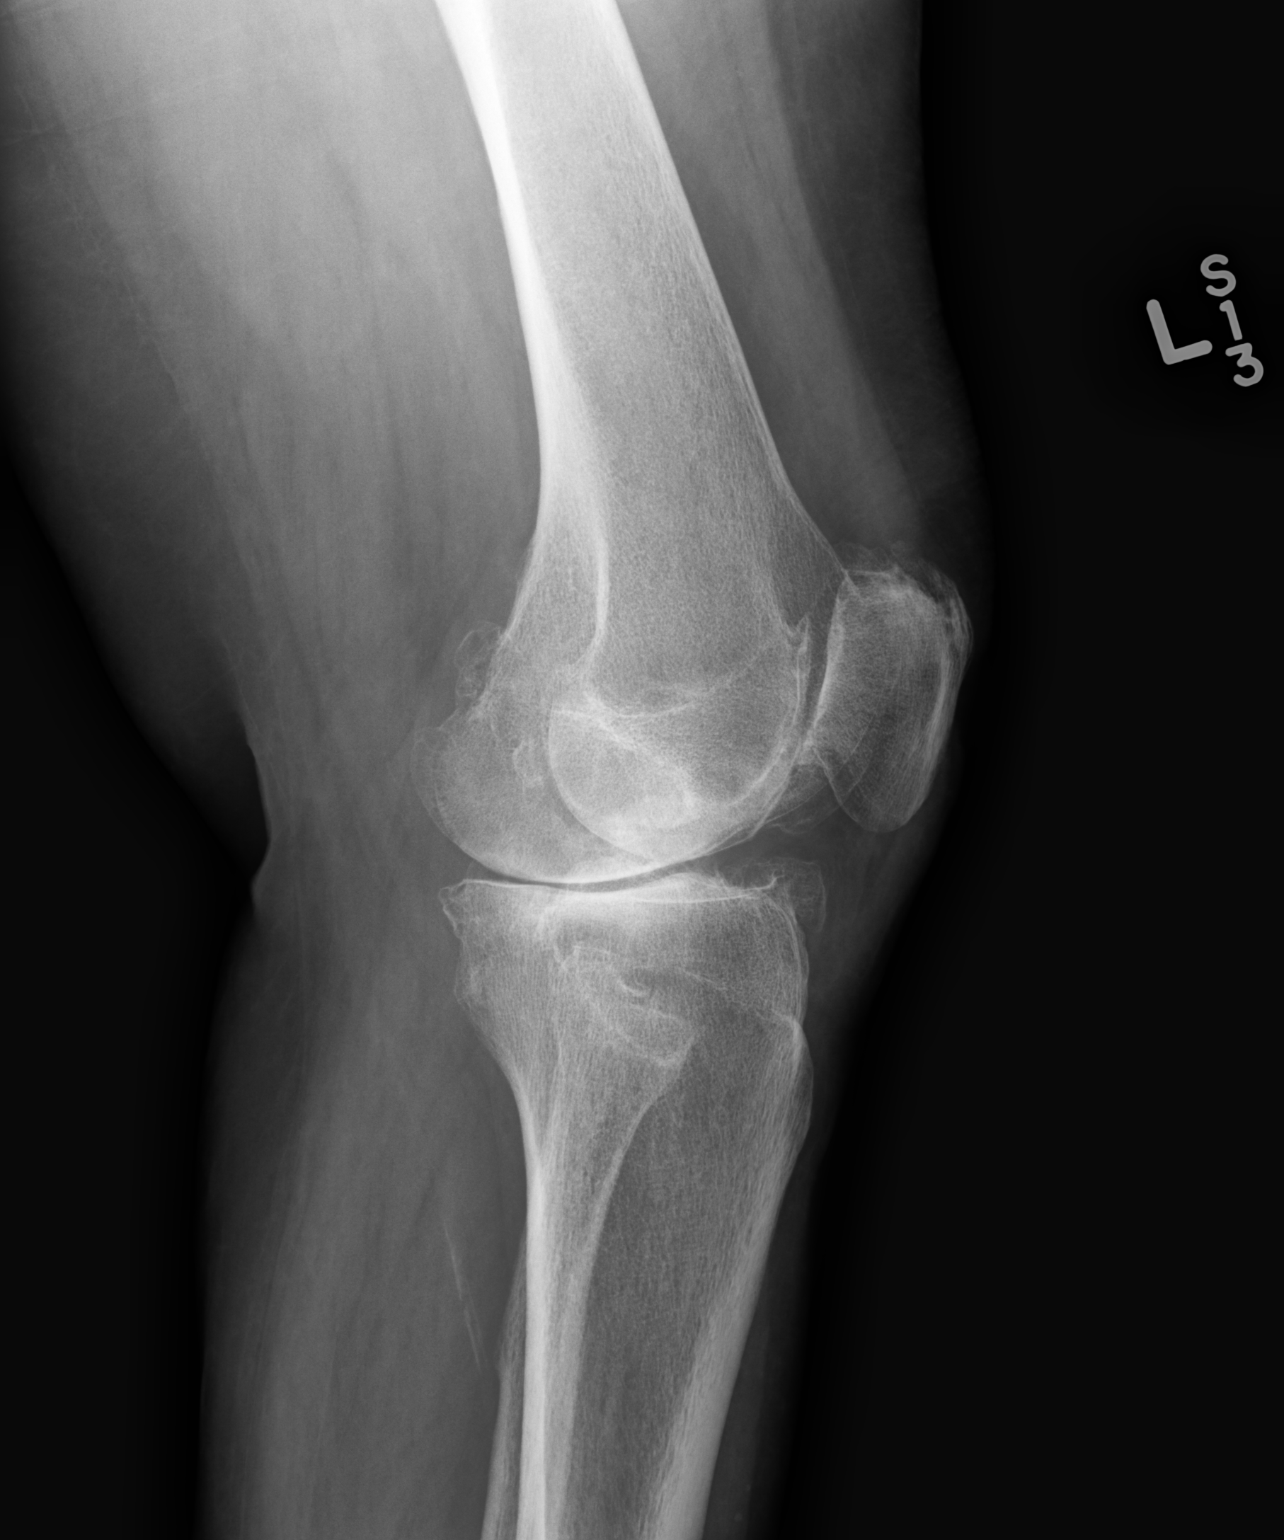

[dg knee ap/lat w/ sunrise left (3 of 3)]
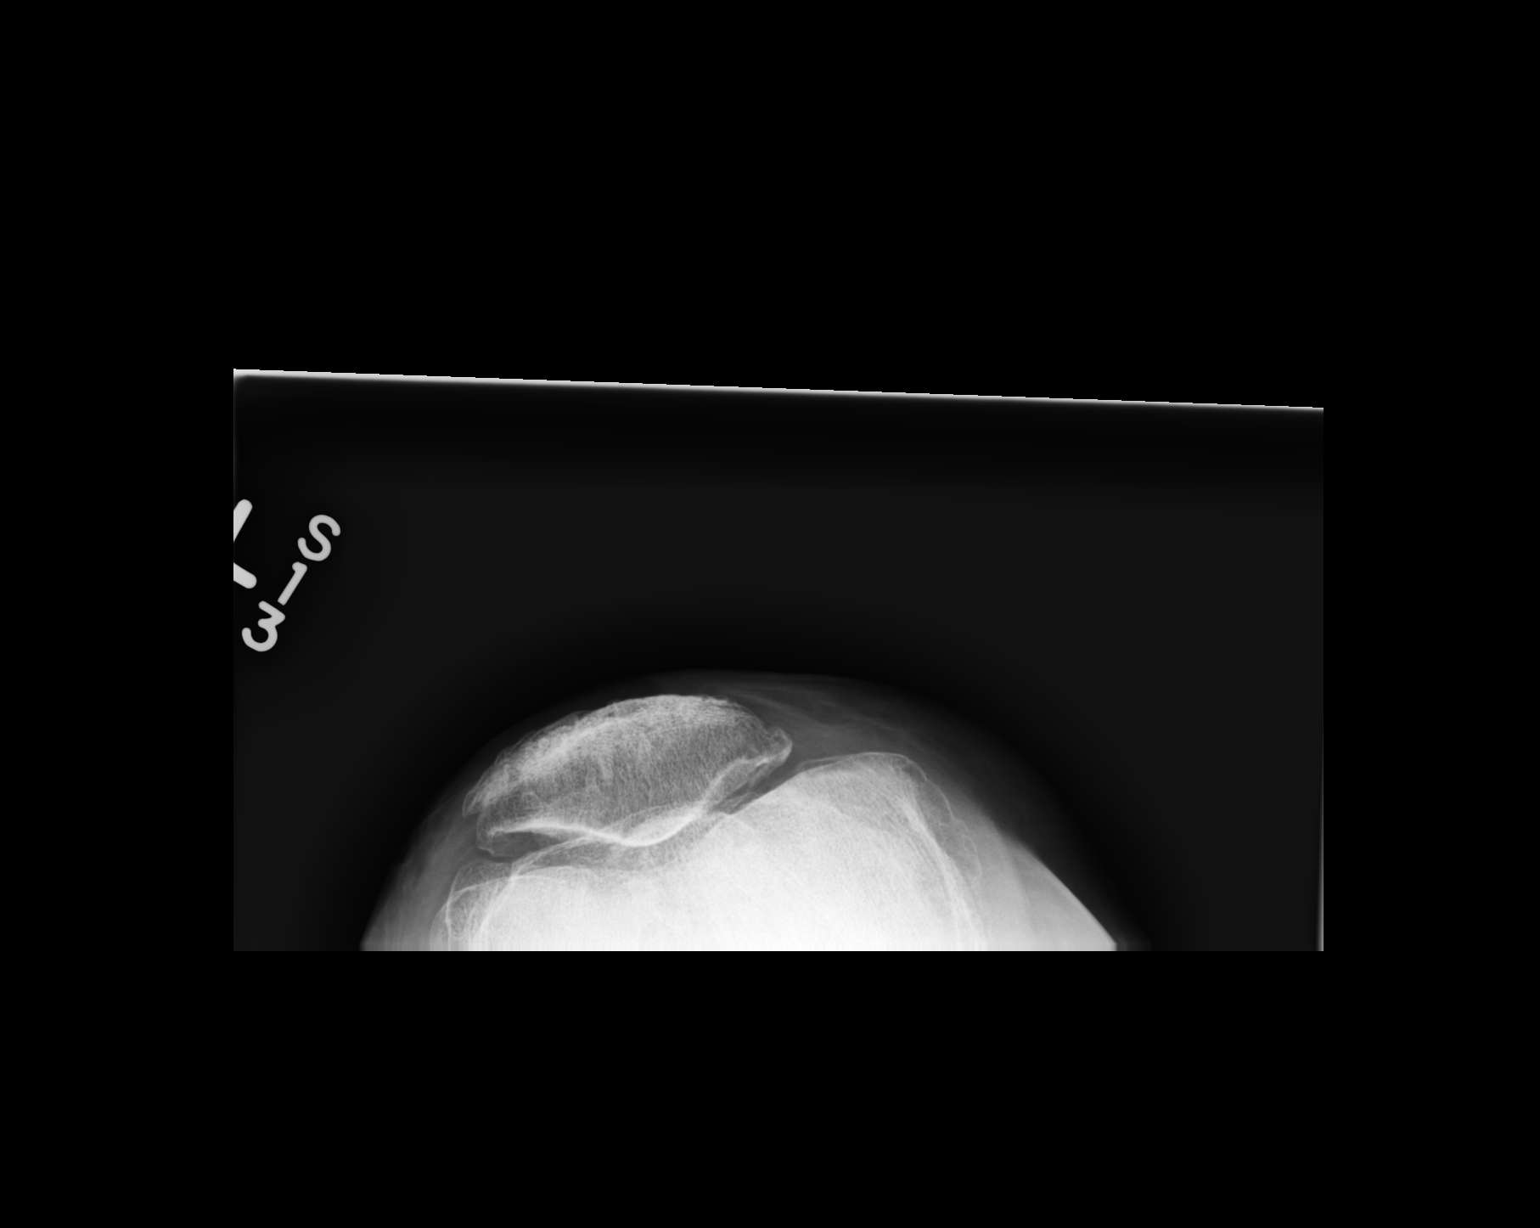

[3 of 3 positions shown; findings below may reference images not displayed]

FINDINGS: Marked medial joint space narrowing with progressive sclerosis and
spur formation. Mild lateral spur formation moderate patellofemoral
spur formation. No effusion.
IMPRESSION: Tricompartmental degenerative changes, most pronounced involving the
medial compartment, followed by the patellofemoral compartment.

## 2018-08-15 ENCOUNTER — Other Ambulatory Visit: Payer: Self-pay | Admitting: Hematology

## 2018-08-15 ENCOUNTER — Other Ambulatory Visit: Payer: Self-pay | Admitting: Internal Medicine

## 2018-08-15 DIAGNOSIS — C50912 Malignant neoplasm of unspecified site of left female breast: Secondary | ICD-10-CM

## 2018-09-02 ENCOUNTER — Telehealth: Payer: Self-pay | Admitting: Internal Medicine

## 2018-09-02 MED ORDER — POTASSIUM CHLORIDE ER 10 MEQ PO TBCR: EXTENDED_RELEASE_TABLET | ORAL | 5 refills | Status: DC

## 2018-09-02 NOTE — Telephone Encounter (Signed)
Medication Refill - Medication: potassium chloride (K-DUR) 10 MEQ tablet   Has the patient contacted their pharmacy? Yes.  Pts daughter called stating they called pharmacy and could not get this refilled. She states pt is out of this medication. Please advise.  (Agent: If no, request that the patient contact the pharmacy for the refill.) (Agent: If yes, when and what did the pharmacy advise?)  Preferred Pharmacy (with phone number or street name):  [facility] [phone] - [address], [address]  [place] [address]  Phone: [phone] Fax: [phone]  Not a 24 hour pharmacy; exact hours not known.     Agent: Please be advised that RX refills may take up to 3 business days. We ask that you follow-up with your pharmacy.

## 2018-09-17 ENCOUNTER — Ambulatory Visit (INDEPENDENT_AMBULATORY_CARE_PROVIDER_SITE_OTHER): Payer: Medicare Other | Admitting: Podiatry

## 2018-09-17 ENCOUNTER — Other Ambulatory Visit: Payer: Self-pay

## 2018-09-17 VITALS — Temp 97.6°F

## 2018-09-17 DIAGNOSIS — M7751 Other enthesopathy of right foot: Secondary | ICD-10-CM

## 2018-09-17 NOTE — Progress Notes (Signed)
Subjective:  Patient ID: Doris Zamora, female    DOB: 06-10-1930,  MRN: [id number]  Chief Complaint  Patient presents with  . Wound Check    Right sub 5th scrape, 2 week duration, currently no drainage, pt states "healing but sore"    85 y.o. female presents with the above complaint. Hx as above,   Review of Systems: Negative except as noted in the HPI. Denies N/V/F/Ch.  Past Medical History:  Diagnosis Date  . ANXIETY   . Endometrial cancer ([place]) 2007  . Hyperglycemia 04/20/2014  . HYPERLIPIDEMIA   . INSOMNIA-SLEEP DISORDER-UNSPEC   . WEIGHT LOSS     Current Outpatient Medications:  .  ALPRAZolam (XANAX) 0.5 MG tablet, TAKE 1 TABLET(0.5 MG) BY MOUTH DAILY AS NEEDED, Disp: 30 tablet, Rfl: 5 .  anastrozole (ARIMIDEX) 1 MG tablet, TAKE 1 TABLET(1 MG) BY MOUTH DAILY, Disp: 30 tablet, Rfl: 3 .  cetirizine (ZYRTEC) 10 MG tablet, TAKE 1 TABLET(10 MG) BY MOUTH DAILY, Disp: 30 tablet, Rfl: 5 .  cholecalciferol (VITAMIN D) 1000 UNITS tablet, Take 1,000 Units by mouth daily. , Disp: , Rfl:  .  co-enzyme Q-10 30 MG capsule, Take 100 mg by mouth daily. , Disp: , Rfl:  .  fluticasone (FLONASE) 50 MCG/ACT nasal spray, Place 2 sprays into both nostrils daily., Disp: 16 g, Rfl: 2 .  furosemide (LASIX) 20 MG tablet, TAKE 1/2 TO 1 TABLET BY MOUTH DAILY AS NEEDED FOR SWELLING, Disp: 30 tablet, Rfl: 5 .  Multiple Vitamins-Minerals (MULTIVITAMIN WITH MINERALS) tablet, Take 1 tablet by mouth daily. Forward Gold, Disp: , Rfl:  .  Omega-3 Fatty Acids (FISH OIL) 1000 MG CPDR, Take 1 capsule by mouth daily. , Disp: , Rfl:  .  OVER THE COUNTER MEDICATION, Take 1 tablet by mouth at bedtime as needed. zzquil for sleep , Disp: , Rfl:  .  OVER THE COUNTER MEDICATION, Take 2 mLs by mouth daily. Tumeric with folic acid 500mg /100mg , Disp: , Rfl:  .  potassium chloride (K-DUR) 10 MEQ tablet, TAKE 1 TABLET BY MOUTH EVERY DAY AS NEEDED WITH USE OF LASIX, Disp: 30 tablet, Rfl: 5  Social History   Tobacco Use   Smoking Status Former Smoker  . Years: 50.00  . Types: Cigarettes  . Quit date: 02/10/1994  . Years since quitting: 24.6  Smokeless Tobacco Never Used    Allergies  Allergen Reactions  . Synthroid [Levothyroxine Sodium] Swelling    Happened yrs ago  . Pneumovax [Pneumococcal Polysaccharide Vaccine] Other (See Comments)    Patient stated,"I don't know if I'm allergic to this or not. I'm 85 years old."   Objective:   Vitals:   09/17/18 1139  Temp: 97.6 F (36.4 C)   There is no height or weight on file to calculate BMI. Constitutional Well developed. Well nourished.  Vascular Dorsalis pedis pulses palpable bilaterally. Posterior tibial pulses palpable bilaterally. Capillary refill normal to all digits.  No cyanosis or clubbing noted. Pedal hair growth normal.  Neurologic Normal speech. Oriented to person, place, and time. Epicritic sensation to light touch grossly present bilaterally.  Dermatologic Nails well groomed and normal in appearance. Hyperkeratosis Right 5th MPJ  Orthopedic: POP R 5th MPJ   Radiographs: None Assessment:   1. Capsulitis of metatarsophalangeal (MTP) joint of right foot    Plan:  Patient was evaluated and treated and all questions answered.  Capsulitis R 5th MPJ with Hyperkeratosis -Courtesy debridement for patient -Offloaded with pad. -F/u PRN  No follow-ups on file.

## 2018-09-28 ENCOUNTER — Ambulatory Visit (INDEPENDENT_AMBULATORY_CARE_PROVIDER_SITE_OTHER): Payer: Medicare Other | Admitting: Sports Medicine

## 2018-09-28 ENCOUNTER — Other Ambulatory Visit: Payer: Self-pay

## 2018-09-28 VITALS — BP 141/59 | Ht 66.0 in | Wt 190.0 lb

## 2018-09-28 DIAGNOSIS — M1712 Unilateral primary osteoarthritis, left knee: Secondary | ICD-10-CM | POA: Diagnosis present

## 2018-09-28 NOTE — Progress Notes (Signed)
PCP: Biagio Borg, MD  Subjective:   HPI: Patient is a 85 y.o. female here for hip pain. She had left knee injection about 1 month ago and the pain was alleviated for only about 3-4 days. She is interested in being referred for left knee replacement surgery. The patient has previously had Visco supplementation, PT and CSI, which have failed to alleviate patient's pain for any significant amount of time. She denies shooting pains into her foot. To help the pain she uses warm compresses and heating pads. She also bought CBD topical which has been minimally helpful.  Otherwise, she denies fevers, headaches, chest pain, shortness of breath, nausea, vomiting, abdominal pain, and myalgias.   Past Medical History:  Diagnosis Date  . ANXIETY   . Endometrial cancer ([place]) 2007  . Hyperglycemia 04/20/2014  . HYPERLIPIDEMIA   . INSOMNIA-SLEEP DISORDER-UNSPEC   . WEIGHT LOSS     Current Outpatient Medications on File Prior to Visit  Medication Sig Dispense Refill  . ALPRAZolam (XANAX) 0.5 MG tablet TAKE 1 TABLET(0.5 MG) BY MOUTH DAILY AS NEEDED 30 tablet 5  . anastrozole (ARIMIDEX) 1 MG tablet TAKE 1 TABLET(1 MG) BY MOUTH DAILY 30 tablet 3  . cetirizine (ZYRTEC) 10 MG tablet TAKE 1 TABLET(10 MG) BY MOUTH DAILY 30 tablet 5  . cholecalciferol (VITAMIN D) 1000 UNITS tablet Take 1,000 Units by mouth daily.     Marland Kitchen co-enzyme Q-10 30 MG capsule Take 100 mg by mouth daily.     . fluticasone (FLONASE) 50 MCG/ACT nasal spray Place 2 sprays into both nostrils daily. 16 g 2  . furosemide (LASIX) 20 MG tablet TAKE 1/2 TO 1 TABLET BY MOUTH DAILY AS NEEDED FOR SWELLING 30 tablet 5  . Multiple Vitamins-Minerals (MULTIVITAMIN WITH MINERALS) tablet Take 1 tablet by mouth daily. Forward Gold    . Omega-3 Fatty Acids (FISH OIL) 1000 MG CPDR Take 1 capsule by mouth daily.     Marland Kitchen OVER THE COUNTER MEDICATION Take 1 tablet by mouth at bedtime as needed. zzquil for sleep     . OVER THE COUNTER MEDICATION Take 2 mLs by mouth  daily. Tumeric with folic acid 500mg /100mg     . potassium chloride (K-DUR) 10 MEQ tablet TAKE 1 TABLET BY MOUTH EVERY DAY AS NEEDED WITH USE OF LASIX 30 tablet 5   No current facility-administered medications on file prior to visit.     Past Surgical History:  Procedure Laterality Date  . ABDOMINAL HYSTERECTOMY  2007  . BREAST BIOPSY Left 01/26/2014   x3, malignant  . OOPHORECTOMY  2007  . THYROIDECTOMY, PARTIAL  1983    Allergies  Allergen Reactions  . Synthroid [Levothyroxine Sodium] Swelling    Happened yrs ago  . Pneumovax [Pneumococcal Polysaccharide Vaccine] Other (See Comments)    Patient stated,"I don't know if I'm allergic to this or not. I'm 85 years old."    Social History   Socioeconomic History  . Marital status: Widowed    Spouse name: Not on file  . Number of children: Not on file  . Years of education: Not on file  . Highest education level: Not on file  Occupational History  . Not on file  Social Needs  . Financial resource strain: Not on file  . Food insecurity    Worry: Not on file    Inability: Not on file  . Transportation needs    Medical: Not on file    Non-medical: Not on file  Tobacco Use  . Smoking  status: Former Smoker    Years: 50.00    Types: Cigarettes    Quit date: 02/10/1994    Years since quitting: 24.6  . Smokeless tobacco: Never Used  Substance and Sexual Activity  . Alcohol use: Yes    Alcohol/week: 7.0 standard drinks    Types: 7 Glasses of wine per week    Comment: 2 0z. HS to help her sleep  . Drug use: No    Comment: admiited to poat use of marjuana to help her sleep  . Sexual activity: Not on file  Lifestyle  . Physical activity    Days per week: Not on file    Minutes per session: Not on file  . Stress: Not on file  Relationships  . Social Herbalist on phone: Not on file    Gets together: Not on file    Attends religious service: Not on file    Active member of club or organization: Not on file     Attends meetings of clubs or organizations: Not on file    Relationship status: Not on file  . Intimate partner violence    Fear of current or ex partner: Not on file    Emotionally abused: Not on file    Physically abused: Not on file    Forced sexual activity: Not on file  Other Topics Concern  . 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    Family History  Problem Relation Age of Onset  . Diabetes Mother   . Muscular dystrophy Father   . Cancer Other 40       breast cancer   . Cancer Sister 77       pancreatic cancer     BP (!) 141/59   Ht 5\' 6"  (1.676 m)   Wt 190 lb (86.2 kg)   BMI 30.67 kg/m   Review of Systems: See HPI above.     Objective:  Physical Exam:  Gen: NAD, comfortable in exam room Right Knee: - Inspection: no gross deformity. No swelling/effusion, erythema or bruising. Skin intact - Palpation: no TTP - ROM: full active ROM with flexion and extension in knee and hip - Strength: 5/5 strength - Neuro/vasc: NV intact - Special Tests: - LIGAMENTS: negative anterior and posterior drawer, no MCL or LCL laxity  -- MENISCUS: negative McMurray's -- PF JOINT: nml patellar mobility bilaterally.  negative patellar grind, negative patellar apprehension  Left Knee: last XRays were April 2018 - Inspection: no gross deformity. Minor swelling to posterior aspect of knee, erythema or bruising. Skin intact - Palpation: minor TTP medial joint line - ROM: full active ROM with flexion and extension in knee and hip - Strength: 5/5 strength - Neuro/vasc: NV intact - Special Tests: - LIGAMENTS: negative anterior and posterior drawer, no MCL or LCL laxity  -- MENISCUS: negative McMurray's -- PF JOINT: nml patellar mobility bilaterally.  negative patellar grind, negative patellar apprehension  Hips: normal ROM, negative FABER and FADIR bilaterally   Left Knee XRAY Findings (April 2018): Findings: consistent with end  stage OA, including medial compartment bone-on-bone, sclerotic changes with osteophytes and subchondral cyst formation identified. Impression: end stage OA of left knee Assessment & Plan:  1. Left knee pain secondary to end-stage OA -Referral placed to Orthopedic surgeon Dr. Mayer Camel -Continue current pain management and home exercises  Milus Banister, [place], PGY-2 09/28/2018  11:50 AM  Patient seen and evaluated with the resident.  I agree with the above plan of care.  This is a very healthy 85 year old female that has a longstanding end-stage left knee DJD.  She is interested in discussing total knee arthroplasty.  Therefore, I will refer her to Dr. Mayer Camel to discuss this further.  I will defer further work-up and treatment to his discretion.  Patient will follow-up with me as needed.

## 2018-09-28 NOTE — Patient Instructions (Addendum)
Dr. Mayer Camel at [facility] [phone]  Appt: 10/01/2018 3 pm arrival for a 3:30 pm appointment. Bring your insurance cards with you. You are required to wear a mask and cannot bring anyone with you to the appointment.

## 2018-09-29 ENCOUNTER — Encounter: Payer: Self-pay | Admitting: Sports Medicine

## 2018-10-05 ENCOUNTER — Other Ambulatory Visit: Payer: Self-pay | Admitting: Orthopedic Surgery

## 2018-11-02 ENCOUNTER — Encounter (HOSPITAL_COMMUNITY): Payer: Self-pay

## 2018-11-02 NOTE — Patient Instructions (Addendum)
DUE TO COVID-19 ONLY ONE VISITOR IS ALLOWED TO COME WITH YOU AND STAY IN THE WAITING ROOM ONLY DURING PRE OP AND PROCEDURE. THE ONE VISITOR MAY VISIT WITH YOU IN YOUR PRIVATE ROOM DURING VISITING HOURS ONLY!!   COVID SWAB TESTING MUST BE COMPLETED ON: Thursday, Sept. 24, 2020 at 2:50 PM.    [address], [place] -[facility] enter pre surgical testing line (Must self quarantine after testing. Follow instructions on handout.)             Your procedure is scheduled on: Monday, Sept. 28, 2020   Report to [facility] Main  Entrance    Report to admitting at 12:30 PM   Call this number if you have problems the morning of surgery [phone]   Do not eat food:After Midnight.   May have liquids until 12:00 Noon day of surgery   CLEAR LIQUID DIET  Foods Allowed                                                                     Foods Excluded  Water, Black Coffee and tea, regular and decaf                             liquids that you cannot  Plain Jell-O in any flavor  (No red)                                           see through such as: Fruit ices (not with fruit pulp)                                     milk, soups, orange juice  Iced Popsicles (No red)                                    All solid food Carbonated beverages, regular and diet                                    Apple juices Sports drinks like Gatorade (No red) Lightly seasoned clear broth or consume(fat free) Sugar, honey syrup  Sample Menu Breakfast                                Lunch                                     Supper Cranberry juice                    Beef broth                            Chicken broth Jell-O  Grape juice                           Apple juice Coffee or tea                        Jell-O                                      Popsicle                                                Coffee or tea                        Coffee  or tea   Complete one Ensure drink the morning of surgery at  12:00 Noon the day of surgery.   Brush your teeth the morning of surgery.   Do NOT smoke after Midnight   Take these medicines the morning of surgery with A SIP OF WATER: Anastrozole, Cetirizine, Alprazolam if needed                               You may not have any metal on your body including hair pins, jewelry, and body piercings             Do not wear make-up, lotions, powders, perfumes/cologne, or deodorant             Do not wear nail polish.  Do not shave  48 hours prior to surgery.              Do not bring valuables to the hospital. [place].   Contacts, dentures or bridgework may not be worn into surgery.   Bring small overnight bag day of surgery.   Special Instructions: Bring a copy of your healthcare power of attorney and living will documents         the day of surgery if you haven't scanned them in before.              Please read over the following fact sheets you were given:  [facility] - Preparing for Surgery Before surgery, you can play an important role.  Because skin is not sterile, your skin needs to be as free of germs as possible.  You can reduce the number of germs on your skin by washing with CHG (chlorahexidine gluconate) soap before surgery.  CHG is an antiseptic cleaner which kills germs and bonds with the skin to continue killing germs even after washing. Please DO NOT use if you have an allergy to CHG or antibacterial soaps.  If your skin becomes reddened/irritated stop using the CHG and inform your nurse when you arrive at Short Stay. Do not shave (including legs and underarms) for at least 48 hours prior to the first CHG shower.  You may shave your face/neck.  Please follow these instructions carefully:  1.  Shower with CHG Soap the night before surgery and the  morning of surgery.  2.  If you choose  to wash your hair, wash your hair first  as usual with your normal  shampoo.  3.  After you shampoo, rinse your hair and body thoroughly to remove the shampoo.                             4.  Use CHG as you would any other liquid soap.  You can apply chg directly to the skin and wash.  Gently with a scrungie or clean washcloth.  5.  Apply the CHG Soap to your body ONLY FROM THE NECK DOWN.   Do   not use on face/ open                           Wound or open sores. Avoid contact with eyes, ears mouth and   genitals (private parts).                       Wash face,  Genitals (private parts) with your normal soap.             6.  Wash thoroughly, paying special attention to the area where your    surgery  will be performed.  7.  Thoroughly rinse your body with warm water from the neck down.  8.  DO NOT shower/wash with your normal soap after using and rinsing off the CHG Soap.                9.  Pat yourself dry with a clean towel.            10.  Wear clean pajamas.            11.  Place clean sheets on your bed the night of your first shower and do not  sleep with pets. Day of Surgery : Do not apply any lotions/deodorants the morning of surgery.  Please wear clean clothes to the hospital/surgery center.  FAILURE TO FOLLOW THESE INSTRUCTIONS MAY RESULT IN THE CANCELLATION OF YOUR SURGERY  PATIENT SIGNATURE_________________________________  NURSE SIGNATURE__________________________________  ________________________________________________________________________   Adam Phenix  An incentive spirometer is a tool that can help keep your lungs clear and active. This tool measures how well you are filling your lungs with each breath. Taking long deep breaths may help reverse or decrease the chance of developing breathing (pulmonary) problems (especially infection) following:  A long period of time when you are unable to move or be active. BEFORE THE PROCEDURE   If the spirometer includes an indicator to show your best effort,  your nurse or respiratory therapist will set it to a desired goal.  If possible, sit up straight or lean slightly forward. Try not to slouch.  Hold the incentive spirometer in an upright position. INSTRUCTIONS FOR USE  1. Sit on the edge of your bed if possible, or sit up as far as you can in bed or on a chair. 2. Hold the incentive spirometer in an upright position. 3. Breathe out normally. 4. Place the mouthpiece in your mouth and seal your lips tightly around it. 5. Breathe in slowly and as deeply as possible, raising the piston or the ball toward the top of the column. 6. Hold your breath for 3-5 seconds or for as long as possible. Allow the piston or ball to fall to the bottom of the column. 7. Remove the mouthpiece from your mouth and breathe out  normally. 8. Rest for a few seconds and repeat Steps 1 through 7 at least 10 times every 1-2 hours when you are awake. Take your time and take a few normal breaths between deep breaths. 9. The spirometer may include an indicator to show your best effort. Use the indicator as a goal to work toward during each repetition. 10. After each set of 10 deep breaths, practice coughing to be sure your lungs are clear. If you have an incision (the cut made at the time of surgery), support your incision when coughing by placing a pillow or rolled up towels firmly against it. Once you are able to get out of bed, walk around indoors and cough well. You may stop using the incentive spirometer when instructed by your caregiver.  RISKS AND COMPLICATIONS  Take your time so you do not get dizzy or light-headed.  If you are in pain, you may need to take or ask for pain medication before doing incentive spirometry. It is harder to take a deep breath if you are having pain. AFTER USE  Rest and breathe slowly and easily.  It can be helpful to keep track of a log of your progress. Your caregiver can provide you with a simple table to help with this. If you are  using the spirometer at home, follow these instructions: [employer] IF:   You are having difficultly using the spirometer.  You have trouble using the spirometer as often as instructed.  Your pain medication is not giving enough relief while using the spirometer.  You develop fever of 100.5 F (38.1 C) or higher. SEEK IMMEDIATE MEDICAL CARE IF:   You cough up bloody sputum that had not been present before.  You develop fever of 102 F (38.9 C) or greater.  You develop worsening pain at or near the incision site. MAKE SURE YOU:   Understand these instructions.  Will watch your condition.  Will get help right away if you are not doing well or get worse. Document Released: 06/09/2006 Document Revised: 04/21/2011 Document Reviewed: 08/10/2006 ExitCare Patient Information 2014 ExitCare, [place].   ________________________________________________________________________  WHAT IS A BLOOD TRANSFUSION? Blood Transfusion Information  A transfusion is the replacement of blood or some of its parts. Blood is made up of multiple cells which provide different functions.  Red blood cells carry oxygen and are used for blood loss replacement.  White blood cells fight against infection.  Platelets control bleeding.  Plasma helps clot blood.  Other blood products are available for specialized needs, such as hemophilia or other clotting disorders. BEFORE THE TRANSFUSION  Who gives blood for transfusions?   Healthy volunteers who are fully evaluated to make sure their blood is safe. This is blood bank blood. Transfusion therapy is the safest it has ever been in the practice of medicine. Before blood is taken from a donor, a complete history is taken to make sure that person has no history of diseases nor engages in risky social behavior (examples are intravenous drug use or sexual activity with multiple partners). The donor's travel history is screened to minimize risk of transmitting  infections, such as malaria. The donated blood is tested for signs of infectious diseases, such as HIV and hepatitis. The blood is then tested to be sure it is compatible with you in order to minimize the chance of a transfusion reaction. If you or a relative donates blood, this is often done in anticipation of surgery and is not appropriate for emergency situations.  It takes many days to process the donated blood. RISKS AND COMPLICATIONS Although transfusion therapy is very safe and saves many lives, the main dangers of transfusion include:   Getting an infectious disease.  Developing a transfusion reaction. This is an allergic reaction to something in the blood you were given. Every precaution is taken to prevent this. The decision to have a blood transfusion has been considered carefully by your caregiver before blood is given. Blood is not given unless the benefits outweigh the risks. AFTER THE TRANSFUSION  Right after receiving a blood transfusion, you will usually feel much better and more energetic. This is especially true if your red blood cells have gotten low (anemic). The transfusion raises the level of the red blood cells which carry oxygen, and this usually causes an energy increase.  The nurse administering the transfusion will monitor you carefully for complications. HOME CARE INSTRUCTIONS  No special instructions are needed after a transfusion. You may find your energy is better. Speak with your caregiver about any limitations on activity for underlying diseases you may have. SEEK MEDICAL CARE IF:   Your condition is not improving after your transfusion.  You develop redness or irritation at the intravenous (IV) site. SEEK IMMEDIATE MEDICAL CARE IF:  Any of the following symptoms occur over the next 12 hours:  Shaking chills.  You have a temperature by mouth above 102 F (38.9 C), not controlled by medicine.  Chest, back, or muscle pain.  People around you feel you are  not acting correctly or are confused.  Shortness of breath or difficulty breathing.  Dizziness and fainting.  You get a rash or develop hives.  You have a decrease in urine output.  Your urine turns a dark color or changes to pink, red, or brown. Any of the following symptoms occur over the next 10 days:  You have a temperature by mouth above 102 F (38.9 C), not controlled by medicine.  Shortness of breath.  Weakness after normal activity.  The white part of the eye turns yellow (jaundice).  You have a decrease in the amount of urine or are urinating less often.  Your urine turns a dark color or changes to pink, red, or brown. Document Released: 01/25/2000 Document Revised: 04/21/2011 Document Reviewed: 09/13/2007 [facility] Patient Information 2014 [place], [place].  _______________________________________________________________________

## 2018-11-03 ENCOUNTER — Encounter (HOSPITAL_COMMUNITY)
Admission: RE | Admit: 2018-11-03 | Discharge: 2018-11-03 | Disposition: A | Discharge status: Home / Self Care | Payer: Medicare Other | Source: Ambulatory Visit | Attending: Orthopedic Surgery | Admitting: Orthopedic Surgery

## 2018-11-03 ENCOUNTER — Encounter (HOSPITAL_COMMUNITY): Payer: Self-pay

## 2018-11-03 ENCOUNTER — Ambulatory Visit (HOSPITAL_COMMUNITY)
Admission: RE | Admit: 2018-11-03 | Discharge: 2018-11-03 | Disposition: A | Discharge status: Home / Self Care | Payer: Medicare Other | Source: Ambulatory Visit | Attending: Orthopedic Surgery | Admitting: Orthopedic Surgery

## 2018-11-03 ENCOUNTER — Other Ambulatory Visit: Payer: Self-pay

## 2018-11-03 DIAGNOSIS — M1712 Unilateral primary osteoarthritis, left knee: Secondary | ICD-10-CM | POA: Insufficient documentation

## 2018-11-03 DIAGNOSIS — E785 Hyperlipidemia, unspecified: Secondary | ICD-10-CM | POA: Insufficient documentation

## 2018-11-03 DIAGNOSIS — Z79899 Other long term (current) drug therapy: Secondary | ICD-10-CM | POA: Insufficient documentation

## 2018-11-03 DIAGNOSIS — Z87891 Personal history of nicotine dependence: Secondary | ICD-10-CM | POA: Diagnosis not present

## 2018-11-03 DIAGNOSIS — Z01818 Encounter for other preprocedural examination: Secondary | ICD-10-CM | POA: Diagnosis present

## 2018-11-03 HISTORY — DX: Hypothyroidism, unspecified: E03.9

## 2018-11-03 HISTORY — DX: Heartburn: R12

## 2018-11-03 HISTORY — DX: Edema, unspecified: R60.9

## 2018-11-03 HISTORY — DX: Venous insufficiency (chronic) (peripheral): I87.2

## 2018-11-03 HISTORY — DX: Localized edema: R60.0

## 2018-11-03 HISTORY — DX: Unspecified osteoarthritis, unspecified site: M19.90

## 2018-11-03 HISTORY — DX: Other seasonal allergic rhinitis: J30.2

## 2018-11-03 HISTORY — DX: Unspecified hearing loss, unspecified ear: H91.90

## 2018-11-03 LAB — CBC WITH DIFFERENTIAL/PLATELET
Abs Immature Granulocytes: 0.01 10*3/uL (ref 0.00–0.07)
Basophils Absolute: 0 10*3/uL (ref 0.0–0.1)
Basophils Relative: 1 %
Eosinophils Absolute: 0.1 10*3/uL (ref 0.0–0.5)
Eosinophils Relative: 1 %
HCT: 41 % (ref 36.0–46.0)
Hemoglobin: 12.9 g/dL (ref 12.0–15.0)
Immature Granulocytes: 0 %
Lymphocytes Relative: 36 %
Lymphs Abs: 2.2 10*3/uL (ref 0.7–4.0)
MCH: 30.6 pg (ref 26.0–34.0)
MCHC: 31.5 g/dL (ref 30.0–36.0)
MCV: 97.2 fL (ref 80.0–100.0)
Monocytes Absolute: 0.8 10*3/uL (ref 0.1–1.0)
Monocytes Relative: 13 %
Neutro Abs: 2.9 10*3/uL (ref 1.7–7.7)
Neutrophils Relative %: 49 %
Platelets: 222 10*3/uL (ref 150–400)
RBC: 4.22 MIL/uL (ref 3.87–5.11)
RDW: 14 % (ref 11.5–15.5)
WBC: 6 10*3/uL (ref 4.0–10.5)
nRBC: 0 % (ref 0.0–0.2)

## 2018-11-03 LAB — SURGICAL PCR SCREEN
MRSA, PCR: NEGATIVE
Staphylococcus aureus: POSITIVE — AB

## 2018-11-03 LAB — URINALYSIS, ROUTINE W REFLEX MICROSCOPIC
Bilirubin Urine: NEGATIVE
Glucose, UA: NEGATIVE mg/dL
Hgb urine dipstick: NEGATIVE
Ketones, ur: NEGATIVE mg/dL
Leukocytes,Ua: NEGATIVE
Nitrite: NEGATIVE
Protein, ur: NEGATIVE mg/dL
Specific Gravity, Urine: 1.011 (ref 1.005–1.030)
pH: 5 (ref 5.0–8.0)

## 2018-11-03 LAB — BASIC METABOLIC PANEL
Anion gap: 7 (ref 5–15)
BUN: 19 mg/dL (ref 8–23)
CO2: 30 mmol/L (ref 22–32)
Calcium: 9.3 mg/dL (ref 8.9–10.3)
Chloride: 102 mmol/L (ref 98–111)
Creatinine, Ser: 0.7 mg/dL (ref 0.44–1.00)
GFR calc Af Amer: >60 mL/min (ref >60)
GFR calc non Af Amer: >60 mL/min (ref >60)
Glucose, Bld: 89 mg/dL (ref 70–99)
Potassium: 4.3 mmol/L (ref 3.5–5.1)
Sodium: 139 mmol/L (ref 135–145)

## 2018-11-03 LAB — APTT: aPTT: 29 seconds (ref 24–36)

## 2018-11-03 LAB — PROTIME-INR
INR: 1 (ref 0.8–1.2)
Prothrombin Time: 13.3 seconds (ref 11.4–15.2)

## 2018-11-03 NOTE — Progress Notes (Signed)
PCP - Dr. Cathlean Cower Cardiologist - N/A  Chest x-ray - 11/03/2018 in epic EKG - 11/03/2018 in epic Stress Test - N/A ECHO - N/A Cardiac Cath - N/A  Sleep Study - N/A CPAP - N/A  Fasting Blood Sugar - N/A Checks Blood Sugar _N/A____ times a day  Blood Thinner Instructions:N/A Aspirin Instructions:N/A Last Dose:N/A  Anesthesia review: N/A  Patient denies shortness of breath, fever, cough and chest pain at PAT appointment   Patient verbalized understanding of instructions that were given to them at the PAT appointment. Patient was also instructed that they will need to review over the PAT instructions again at home before surgery.

## 2018-11-03 NOTE — Progress Notes (Signed)
SPOKE W/  Shirlee Limerick     SCREENING SYMPTOMS OF COVID 19:   COUGH--NO  RUNNY NOSE---NO   SORE THROAT---NO  NASAL CONGESTION----NO  SNEEZING----NO  SHORTNESS OF BREATH---NO  DIFFICULTY BREATHING---NO  TEMP >100.0 -----NO  UNEXPLAINED BODY ACHES------NO  CHILLS -------- NO  HEADACHES ---------NO  LOSS OF SMELL/ TASTE --------NO    HAVE YOU OR ANY FAMILY MEMBER TRAVELLED PAST 14 DAYS OUT OF THE   COUNTY---NO STATE----NO COUNTRY----NO  HAVE YOU OR ANY FAMILY MEMBER BEEN EXPOSED TO ANYONE WITH COVID 19? NO

## 2018-11-04 ENCOUNTER — Other Ambulatory Visit (HOSPITAL_COMMUNITY)
Admission: RE | Admit: 2018-11-04 | Discharge: 2018-11-04 | Disposition: A | Discharge status: Home / Self Care | Payer: Medicare Other | Source: Ambulatory Visit | Attending: Orthopedic Surgery | Admitting: Orthopedic Surgery

## 2018-11-04 DIAGNOSIS — Z20828 Contact with and (suspected) exposure to other viral communicable diseases: Secondary | ICD-10-CM | POA: Diagnosis not present

## 2018-11-04 DIAGNOSIS — M1712 Unilateral primary osteoarthritis, left knee: Secondary | ICD-10-CM | POA: Insufficient documentation

## 2018-11-04 DIAGNOSIS — Z01812 Encounter for preprocedural laboratory examination: Secondary | ICD-10-CM | POA: Insufficient documentation

## 2018-11-05 DIAGNOSIS — M1712 Unilateral primary osteoarthritis, left knee: Secondary | ICD-10-CM | POA: Diagnosis present

## 2018-11-05 LAB — NOVEL CORONAVIRUS, NAA (HOSP ORDER, SEND-OUT TO REF LAB; TAT 18-24 HRS): SARS-CoV-2, NAA: NOT DETECTED

## 2018-11-05 NOTE — H&P (Signed)
TOTAL KNEE ADMISSION H&P  Patient is being admitted for left total knee arthroplasty.  Subjective:  Chief Complaint:left knee pain.  HPI: Doris Zamora, 85 y.o. female, has a history of pain and functional disability in the left knee due to arthritis and has failed non-surgical conservative treatments for greater than 12 weeks to includeNSAID's and/or analgesics, corticosteriod injections, supervised PT with diminished ADL's post treatment, use of assistive devices, weight reduction as appropriate and activity modification.  Onset of symptoms was gradual, starting several years ago with gradually worsening course since that time. The patient noted no past surgery on the left knee(s).  Patient currently rates pain in the left knee(s) at 10 out of 10 with activity. Patient has night pain, worsening of pain with activity and weight bearing, pain that interferes with activities of daily living, pain with passive range of motion, crepitus and joint swelling.  Patient has evidence of joint subluxation and joint space narrowing by imaging studies.  There is no active infection.  Patient Active Problem List   Diagnosis Date Noted  . Hearing loss 04/16/2018  . Venous insufficiency 03/17/2018  . Eustachian tube dysfunction, bilateral 06/06/2017  . Bilateral knee pain 09/27/2016  . Right shoulder pain 09/27/2016  . Leg cramps 02/15/2016  . Gait disorder 05/03/2015  . Allergic rhinitis 04/20/2014  . Hyperglycemia 04/20/2014  . Breast cancer, left breast ([place]) 02/08/2014  . Lower back pain 09/21/2013  . Primary localized osteoarthrosis, lower leg 06/14/2013  . Endometrial cancer ([name])   . Abnormal CT scan 06/17/2012  . Preventative health care 01/11/2012  . Obesity (BMI 30.0-34.9) 02/14/2008  . Hyperlipidemia 08/11/2007  . Anxiety state 08/11/2007  . INSOMNIA-SLEEP DISORDER-UNSPEC 08/11/2007   Past Medical History:  Diagnosis Date  . ANXIETY   . Arthritis   . Endometrial cancer ([place]) 2007  .  Hearing loss   . Heartburn    occ  . Hyperglycemia 04/20/2014  . HYPERLIPIDEMIA   . Hypothyroidism   . INSOMNIA-SLEEP DISORDER-UNSPEC   . Peripheral edema   . Seasonal allergies   . Venous insufficiency   . WEIGHT LOSS     Past Surgical History:  Procedure Laterality Date  . ABDOMINAL HYSTERECTOMY  2007  . BREAST BIOPSY Left 01/26/2014   x3, malignant  . CATARACT EXTRACTION, BILATERAL    . OOPHORECTOMY  2007  . THYROIDECTOMY, PARTIAL  1983    No current facility-administered medications for this encounter.    Current Outpatient Medications  Medication Sig Dispense Refill Last Dose  . ALPRAZolam (XANAX) 0.5 MG tablet TAKE 1 TABLET(0.5 MG) BY MOUTH DAILY AS NEEDED (Patient taking differently: Take 0.5 mg by mouth daily as needed for anxiety. ) 30 tablet 5   . anastrozole (ARIMIDEX) 1 MG tablet TAKE 1 TABLET(1 MG) BY MOUTH DAILY (Patient taking differently: Take 1 mg by mouth daily. ) 30 tablet 3   . BLACK CURRANT SEED OIL PO Take 1 Dose by mouth daily.     . cetirizine (ZYRTEC) 10 MG tablet TAKE 1 TABLET(10 MG) BY MOUTH DAILY (Patient taking differently: Take 10 mg by mouth daily. ) 30 tablet 5   . cholecalciferol (VITAMIN D) 1000 UNITS tablet Take 1,000 Units by mouth daily.      . Coenzyme Q10 (COQ10) 100 MG CAPS Take 100 mg by mouth daily.     . fluticasone (FLONASE) 50 MCG/ACT nasal spray Place 2 sprays into both nostrils daily. 16 g 2   . furosemide (LASIX) 20 MG tablet TAKE 1/2 TO 1  TABLET BY MOUTH DAILY AS NEEDED FOR SWELLING (Patient taking differently: Take 10-20 mg by mouth daily as needed for edema. ) 30 tablet 5   . Krill Oil 350 MG CAPS Take 350 mg by mouth daily.     . Menthol, Topical Analgesic, (BIOFREEZE EX) Apply 1 patch topically daily as needed (pain).     . Naphazoline HCl (CLEAR EYES OP) Place 1 drop into both eyes daily as needed (dry eyes).     Marland Kitchen OVER THE COUNTER MEDICATION Take 1 tablet by mouth daily. Instaflex otc supplement     . potassium chloride  (K-DUR) 10 MEQ tablet TAKE 1 TABLET BY MOUTH EVERY DAY AS NEEDED WITH USE OF LASIX (Patient taking differently: Take 10 mEq by mouth daily as needed (when taking lasix). ) 30 tablet 5   . Selenium 200 MCG CAPS Take 200 mcg by mouth daily.     . Thiamine HCl (VITAMIN B-1) 250 MG tablet Take 250 mg by mouth daily.     . vitamin B-12 (CYANOCOBALAMIN) 1000 MCG tablet Take 1,000 mcg by mouth daily.     . vitamin E 400 UNIT capsule Take 400 Units by mouth daily.      Allergies  Allergen Reactions  . Synthroid [Levothyroxine Sodium] Swelling    Happened yrs ago  . Pneumovax [Pneumococcal Polysaccharide Vaccine] Swelling    Social History   Tobacco Use  . Smoking status: Former Smoker    Years: 50.00    Types: Cigarettes    Quit date: 02/10/1994    Years since quitting: 24.7  . Smokeless tobacco: Never Used  Substance Use Topics  . Alcohol use: Yes    Alcohol/week: 7.0 standard drinks    Types: 7 Glasses of wine per week    Comment: 4 0z. HS to help her sleep    Family History  Problem Relation Age of Onset  . Diabetes Mother   . Muscular dystrophy Father   . Cancer Other 40       breast cancer   . Cancer Sister 51       pancreatic cancer      Review of Systems  Constitutional: Negative.   HENT: Negative.   Eyes: Negative.   Respiratory: Negative.   Cardiovascular:       Htn  Gastrointestinal: Negative.   Genitourinary: Negative.   Musculoskeletal: Positive for joint pain.  Skin: Negative.   Neurological: Negative.   Endo/Heme/Allergies: Negative.   Psychiatric/Behavioral: Negative.     Objective:  Physical Exam  Constitutional: She is oriented to person, place, and time. She appears well-developed and well-nourished.  HENT:  Head: Normocephalic and atraumatic.  Eyes: Pupils are equal, round, and reactive to light.  Neck: Normal range of motion. Neck supple.  Cardiovascular: Intact distal pulses.  Respiratory: Effort normal.  Musculoskeletal:        General:  Tenderness present.     Comments: Left knee has obvious varus deformity.  Range of motion 5/120 with pain on flexion tender along the medial joint line varus stress exacerbates her pain collateral ligaments are stable neurovascular intact distally the skin is intact toes are pink and well perfused.  Neurological: She is alert and oriented to person, place, and time.  Psychiatric: She has a normal mood and affect. Her behavior is normal. Judgment and thought content normal.    Vital signs in last 24 hours:    Labs:   Estimated body mass index is 31.6 kg/m as calculated from the following:  Height as of 11/03/18: 5\' 6"  (1.676 m).   Weight as of 11/03/18: 88.8 kg.   Imaging Review Plain radiographs demonstrate   AP Rosenberg lateral and sunrise x-rays show end-stage arthritis of the left knee medial compartment bone-on-bone lateral subluxation of tibia beneath femur 5 mm.   Assessment/Plan:  End stage arthritis, left knee   The patient history, physical examination, clinical judgment of the provider and imaging studies are consistent with end stage degenerative joint disease of the left knee(s) and total knee arthroplasty is deemed medically necessary. The treatment options including medical management, injection therapy arthroscopy and arthroplasty were discussed at length. The risks and benefits of total knee arthroplasty were presented and reviewed. The risks due to aseptic loosening, infection, stiffness, patella tracking problems, thromboembolic complications and other imponderables were discussed. The patient acknowledged the explanation, agreed to proceed with the plan and consent was signed. Patient is being admitted for inpatient treatment for surgery, pain control, PT, OT, prophylactic antibiotics, VTE prophylaxis, progressive ambulation and ADL's and discharge planning. The patient is planning to be discharged home with home health services     Patient's anticipated LOS is less  than 2 midnights, meeting these requirements: - Younger than 64 - Lives within 1 hour of care - Has a competent adult at home to recover with post-op recover - NO history of  - Chronic pain requiring opiods  - Diabetes  - Coronary Artery Disease  - Heart failure  - Heart attack  - Stroke  - DVT/VTE  - Cardiac arrhythmia  - Respiratory Failure/COPD  - Renal failure  - Anemia  - Advanced Liver disease

## 2018-11-05 NOTE — Progress Notes (Signed)
Called patient and informed her of time change for surgery on 11/08/2018. Patient to arrive at 0915 for 1145 AM surgery on 11/08/2018. Ensure drink at 0615 day of surgery. Take AM medications on your pre op instructions with your ensure drink. Patient and daughter verbalize understanding.

## 2018-11-07 MED ORDER — TRANEXAMIC ACID 1000 MG/10ML IV SOLN
2000.0000 mg | INTRAVENOUS | Status: DC
  Filled 2018-11-07: qty 20

## 2018-11-07 MED ORDER — BUPIVACAINE LIPOSOME 1.3 % IJ SUSP
20.0000 mL | Freq: Once | INTRAMUSCULAR | Status: DC
  Filled 2018-11-07: qty 20

## 2018-11-08 ENCOUNTER — Telehealth (HOSPITAL_COMMUNITY): Payer: Self-pay | Admitting: *Deleted

## 2018-11-08 ENCOUNTER — Encounter (HOSPITAL_COMMUNITY)
Admission: RE | Disposition: A | Discharge status: Home Health | Payer: Self-pay | Source: Home / Self Care | Attending: Orthopedic Surgery

## 2018-11-08 ENCOUNTER — Observation Stay (HOSPITAL_COMMUNITY)
Admission: RE | Admit: 2018-11-08 | Discharge: 2018-11-09 | Disposition: A | Discharge status: Home Health | Payer: Medicare Other | Attending: Orthopedic Surgery | Admitting: Orthopedic Surgery

## 2018-11-08 ENCOUNTER — Ambulatory Visit (HOSPITAL_COMMUNITY): Payer: Medicare Other | Admitting: Physician Assistant

## 2018-11-08 ENCOUNTER — Ambulatory Visit (HOSPITAL_COMMUNITY): Payer: Medicare Other | Admitting: Certified Registered"

## 2018-11-08 ENCOUNTER — Other Ambulatory Visit: Payer: Self-pay

## 2018-11-08 ENCOUNTER — Encounter (HOSPITAL_COMMUNITY): Payer: Self-pay

## 2018-11-08 DIAGNOSIS — F419 Anxiety disorder, unspecified: Secondary | ICD-10-CM | POA: Insufficient documentation

## 2018-11-08 DIAGNOSIS — Z853 Personal history of malignant neoplasm of breast: Secondary | ICD-10-CM | POA: Diagnosis not present

## 2018-11-08 DIAGNOSIS — M1712 Unilateral primary osteoarthritis, left knee: Principal | ICD-10-CM | POA: Diagnosis present

## 2018-11-08 DIAGNOSIS — Z87891 Personal history of nicotine dependence: Secondary | ICD-10-CM | POA: Insufficient documentation

## 2018-11-08 DIAGNOSIS — Z888 Allergy status to other drugs, medicaments and biological substances status: Secondary | ICD-10-CM | POA: Insufficient documentation

## 2018-11-08 DIAGNOSIS — Z79811 Long term (current) use of aromatase inhibitors: Secondary | ICD-10-CM | POA: Insufficient documentation

## 2018-11-08 DIAGNOSIS — Z79899 Other long term (current) drug therapy: Secondary | ICD-10-CM | POA: Insufficient documentation

## 2018-11-08 DIAGNOSIS — E669 Obesity, unspecified: Secondary | ICD-10-CM | POA: Diagnosis not present

## 2018-11-08 DIAGNOSIS — Z8542 Personal history of malignant neoplasm of other parts of uterus: Secondary | ICD-10-CM | POA: Insufficient documentation

## 2018-11-08 DIAGNOSIS — E785 Hyperlipidemia, unspecified: Secondary | ICD-10-CM | POA: Insufficient documentation

## 2018-11-08 DIAGNOSIS — Z96652 Presence of left artificial knee joint: Secondary | ICD-10-CM

## 2018-11-08 DIAGNOSIS — G4700 Insomnia, unspecified: Secondary | ICD-10-CM | POA: Diagnosis not present

## 2018-11-08 DIAGNOSIS — D62 Acute posthemorrhagic anemia: Secondary | ICD-10-CM | POA: Insufficient documentation

## 2018-11-08 DIAGNOSIS — Z6831 Body mass index (BMI) 31.0-31.9, adult: Secondary | ICD-10-CM | POA: Diagnosis not present

## 2018-11-08 HISTORY — PX: TOTAL KNEE ARTHROPLASTY: SHX125

## 2018-11-08 LAB — TYPE AND SCREEN
ABO/RH(D): B POS
Antibody Screen: NEGATIVE

## 2018-11-08 SURGERY — ARTHROPLASTY, KNEE, TOTAL
Anesthesia: Monitor Anesthesia Care | Site: Knee | Laterality: Left

## 2018-11-08 MED ORDER — SODIUM CHLORIDE (PF) 0.9 % IJ SOLN
INTRAMUSCULAR | Status: AC
  Filled 2018-11-08: qty 20

## 2018-11-08 MED ORDER — TRANEXAMIC ACID-NACL 1000-0.7 MG/100ML-% IV SOLN
1000.0000 mg | INTRAVENOUS | Status: AC
  Administered 2018-11-08: 1000 mg via INTRAVENOUS
  Filled 2018-11-08: qty 100

## 2018-11-08 MED ORDER — OXYCODONE HCL 5 MG/5ML PO SOLN: 5.0000 mg | Freq: Once | ORAL | Status: DC | PRN

## 2018-11-08 MED ORDER — ALPRAZOLAM 0.5 MG PO TABS: 0.5000 mg | ORAL_TABLET | Freq: Every day | ORAL | Status: DC | PRN

## 2018-11-08 MED ORDER — FENTANYL CITRATE (PF) 100 MCG/2ML IJ SOLN: 25.0000 ug | INTRAMUSCULAR | Status: DC | PRN

## 2018-11-08 MED ORDER — LORATADINE 10 MG PO TABS
10.0000 mg | ORAL_TABLET | Freq: Every day | ORAL | Status: DC
  Administered 2018-11-09: 09:00:00 10 mg via ORAL
  Filled 2018-11-08: qty 1

## 2018-11-08 MED ORDER — FENTANYL CITRATE (PF) 100 MCG/2ML IJ SOLN
50.0000 ug | INTRAMUSCULAR | Status: DC
  Administered 2018-11-08: 50 ug via INTRAVENOUS
  Filled 2018-11-08: qty 2

## 2018-11-08 MED ORDER — HYDRALAZINE HCL 20 MG/ML IJ SOLN
INTRAMUSCULAR | Status: AC
  Filled 2018-11-08: qty 1

## 2018-11-08 MED ORDER — WATER FOR IRRIGATION, STERILE IR SOLN
Status: DC | PRN
  Administered 2018-11-08: 2000 mL

## 2018-11-08 MED ORDER — VITAMIN D 25 MCG (1000 UNIT) PO TABS
1000.0000 [IU] | ORAL_TABLET | Freq: Every day | ORAL | Status: DC
  Administered 2018-11-09: 1000 [IU] via ORAL
  Filled 2018-11-08: qty 1

## 2018-11-08 MED ORDER — TRANEXAMIC ACID-NACL 1000-0.7 MG/100ML-% IV SOLN
1000.0000 mg | Freq: Once | INTRAVENOUS | Status: AC
  Administered 2018-11-08: 1000 mg via INTRAVENOUS
  Filled 2018-11-08: qty 100

## 2018-11-08 MED ORDER — DIPHENHYDRAMINE HCL 12.5 MG/5ML PO ELIX: 12.5000 mg | ORAL_SOLUTION | ORAL | Status: DC | PRN

## 2018-11-08 MED ORDER — METHOCARBAMOL 500 MG PO TABS
500.0000 mg | ORAL_TABLET | Freq: Four times a day (QID) | ORAL | Status: DC | PRN
  Administered 2018-11-08 – 2018-11-09 (×2): 500 mg via ORAL
  Filled 2018-11-08 (×2): qty 1

## 2018-11-08 MED ORDER — ASPIRIN EC 81 MG PO TBEC: 81.0000 mg | DELAYED_RELEASE_TABLET | Freq: Two times a day (BID) | ORAL | 0 refills | Status: DC

## 2018-11-08 MED ORDER — ONDANSETRON HCL 4 MG PO TABS: 4.0000 mg | ORAL_TABLET | Freq: Four times a day (QID) | ORAL | Status: DC | PRN

## 2018-11-08 MED ORDER — POLYETHYLENE GLYCOL 3350 17 G PO PACK: 17.0000 g | PACK | Freq: Every day | ORAL | Status: DC | PRN

## 2018-11-08 MED ORDER — PHENYLEPHRINE HCL (PRESSORS) 10 MG/ML IV SOLN
INTRAVENOUS | Status: AC
  Filled 2018-11-08: qty 1

## 2018-11-08 MED ORDER — GABAPENTIN 300 MG PO CAPS
300.0000 mg | ORAL_CAPSULE | Freq: Three times a day (TID) | ORAL | Status: DC
  Administered 2018-11-08 – 2018-11-09 (×3): 300 mg via ORAL
  Filled 2018-11-08 (×3): qty 1

## 2018-11-08 MED ORDER — ANASTROZOLE 1 MG PO TABS
1.0000 mg | ORAL_TABLET | Freq: Every day | ORAL | Status: DC
  Administered 2018-11-09: 09:00:00 1 mg via ORAL
  Filled 2018-11-08: qty 1

## 2018-11-08 MED ORDER — BISACODYL 5 MG PO TBEC: 5.0000 mg | DELAYED_RELEASE_TABLET | Freq: Every day | ORAL | Status: DC | PRN

## 2018-11-08 MED ORDER — HYDRALAZINE HCL 20 MG/ML IJ SOLN
5.0000 mg | INTRAMUSCULAR | Status: DC | PRN
  Administered 2018-11-08 (×3): 5 mg via INTRAVENOUS

## 2018-11-08 MED ORDER — MENTHOL (TOPICAL ANALGESIC) 4 % EX GEL: Freq: Every day | CUTANEOUS | Status: DC | PRN

## 2018-11-08 MED ORDER — VITAMIN B-12 1000 MCG PO TABS
1000.0000 ug | ORAL_TABLET | Freq: Every day | ORAL | Status: DC
  Administered 2018-11-09: 09:00:00 1000 ug via ORAL
  Filled 2018-11-08: qty 1

## 2018-11-08 MED ORDER — ACETAMINOPHEN 325 MG PO TABS: 325.0000 mg | ORAL_TABLET | Freq: Four times a day (QID) | ORAL | Status: DC | PRN

## 2018-11-08 MED ORDER — DOCUSATE SODIUM 100 MG PO CAPS
100.0000 mg | ORAL_CAPSULE | Freq: Two times a day (BID) | ORAL | Status: DC
  Administered 2018-11-08 – 2018-11-09 (×2): 100 mg via ORAL
  Filled 2018-11-08 (×2): qty 1

## 2018-11-08 MED ORDER — TIZANIDINE HCL 2 MG PO TABS: 2.0000 mg | ORAL_TABLET | Freq: Four times a day (QID) | ORAL | 0 refills | Status: DC | PRN

## 2018-11-08 MED ORDER — ALUM & MAG HYDROXIDE-SIMETH 200-200-20 MG/5ML PO SUSP: 30.0000 mL | ORAL | Status: DC | PRN

## 2018-11-08 MED ORDER — SODIUM CHLORIDE 0.9 % IV SOLN
INTRAVENOUS | Status: DC | PRN
  Administered 2018-11-08: 25 ug/min via INTRAVENOUS

## 2018-11-08 MED ORDER — ONDANSETRON HCL 4 MG/2ML IJ SOLN: 4.0000 mg | Freq: Once | INTRAMUSCULAR | Status: DC | PRN

## 2018-11-08 MED ORDER — POVIDONE-IODINE 10 % EX SWAB
2.0000 "application " | Freq: Once | CUTANEOUS | Status: AC
  Administered 2018-11-08: 2 via TOPICAL

## 2018-11-08 MED ORDER — BUPIVACAINE HCL 0.25 % IJ SOLN
INTRAMUSCULAR | Status: DC | PRN
  Administered 2018-11-08: 30 mL

## 2018-11-08 MED ORDER — BUPIVACAINE LIPOSOME 1.3 % IJ SUSP
INTRAMUSCULAR | Status: DC | PRN
  Administered 2018-11-08: 20 mL

## 2018-11-08 MED ORDER — DEXAMETHASONE SODIUM PHOSPHATE 10 MG/ML IJ SOLN
10.0000 mg | Freq: Once | INTRAMUSCULAR | Status: AC
  Administered 2018-11-09: 10 mg via INTRAVENOUS
  Filled 2018-11-08: qty 1

## 2018-11-08 MED ORDER — 0.9 % SODIUM CHLORIDE (POUR BTL) OPTIME
TOPICAL | Status: DC | PRN
  Administered 2018-11-08: 1000 mL

## 2018-11-08 MED ORDER — ASPIRIN 81 MG PO CHEW
81.0000 mg | CHEWABLE_TABLET | Freq: Two times a day (BID) | ORAL | Status: DC
  Administered 2018-11-09: 09:00:00 81 mg via ORAL
  Filled 2018-11-08: qty 1

## 2018-11-08 MED ORDER — LACTATED RINGERS IV SOLN
INTRAVENOUS | Status: DC
  Administered 2018-11-08 (×2): via INTRAVENOUS

## 2018-11-08 MED ORDER — HYDROMORPHONE HCL 1 MG/ML IJ SOLN: 0.5000 mg | INTRAMUSCULAR | Status: DC | PRN

## 2018-11-08 MED ORDER — METHOCARBAMOL 500 MG IVPB - SIMPLE MED
500.0000 mg | Freq: Four times a day (QID) | INTRAVENOUS | Status: DC | PRN
  Filled 2018-11-08: qty 50

## 2018-11-08 MED ORDER — MENTHOL 3 MG MT LOZG: 1.0000 | LOZENGE | OROMUCOSAL | Status: DC | PRN

## 2018-11-08 MED ORDER — MIDAZOLAM HCL 2 MG/2ML IJ SOLN
1.0000 mg | INTRAMUSCULAR | Status: DC
  Filled 2018-11-08: qty 2

## 2018-11-08 MED ORDER — PROPOFOL 10 MG/ML IV BOLUS
INTRAVENOUS | Status: AC
  Filled 2018-11-08: qty 60

## 2018-11-08 MED ORDER — FLEET ENEMA 7-19 GM/118ML RE ENEM: 1.0000 | ENEMA | Freq: Once | RECTAL | Status: DC | PRN

## 2018-11-08 MED ORDER — OXYCODONE HCL 5 MG PO TABS: 5.0000 mg | ORAL_TABLET | Freq: Once | ORAL | Status: DC | PRN

## 2018-11-08 MED ORDER — VITAMIN B-1 100 MG PO TABS
250.0000 mg | ORAL_TABLET | Freq: Every day | ORAL | Status: DC
  Administered 2018-11-09: 250 mg via ORAL

## 2018-11-08 MED ORDER — TRANEXAMIC ACID 1000 MG/10ML IV SOLN
INTRAVENOUS | Status: DC | PRN
  Administered 2018-11-08: 2000 mg via TOPICAL

## 2018-11-08 MED ORDER — SELENIUM 200 MCG PO CAPS: 200.0000 ug | ORAL_CAPSULE | Freq: Every day | ORAL | Status: DC

## 2018-11-08 MED ORDER — PROPOFOL 500 MG/50ML IV EMUL
INTRAVENOUS | Status: DC | PRN
  Administered 2018-11-08: 75 ug/kg/min via INTRAVENOUS

## 2018-11-08 MED ORDER — SODIUM CHLORIDE (PF) 0.9 % IJ SOLN
INTRAMUSCULAR | Status: AC
  Filled 2018-11-08: qty 50

## 2018-11-08 MED ORDER — SODIUM CHLORIDE 0.9 % IR SOLN
Status: DC | PRN
  Administered 2018-11-08: 1000 mL

## 2018-11-08 MED ORDER — OXYCODONE HCL 5 MG PO TABS
5.0000 mg | ORAL_TABLET | ORAL | Status: DC | PRN
  Administered 2018-11-08 – 2018-11-09 (×3): 5 mg via ORAL
  Filled 2018-11-08 (×3): qty 1

## 2018-11-08 MED ORDER — PROPOFOL 10 MG/ML IV BOLUS
INTRAVENOUS | Status: AC
  Filled 2018-11-08: qty 20

## 2018-11-08 MED ORDER — FUROSEMIDE 20 MG PO TABS: 10.0000 mg | ORAL_TABLET | Freq: Every day | ORAL | Status: DC | PRN

## 2018-11-08 MED ORDER — SODIUM CHLORIDE (PF) 0.9 % IJ SOLN
INTRAMUSCULAR | Status: DC | PRN
  Administered 2018-11-08: 70 mL

## 2018-11-08 MED ORDER — KCL IN DEXTROSE-NACL 20-5-0.45 MEQ/L-%-% IV SOLN
INTRAVENOUS | Status: DC
  Administered 2018-11-08 – 2018-11-09 (×2): via INTRAVENOUS
  Filled 2018-11-08 (×3): qty 1000

## 2018-11-08 MED ORDER — LIDOCAINE HCL (CARDIAC) PF 100 MG/5ML IV SOSY
PREFILLED_SYRINGE | INTRAVENOUS | Status: DC | PRN
  Administered 2018-11-08: 80 mg via INTRAVENOUS

## 2018-11-08 MED ORDER — METOCLOPRAMIDE HCL 5 MG/ML IJ SOLN: 5.0000 mg | Freq: Three times a day (TID) | INTRAMUSCULAR | Status: DC | PRN

## 2018-11-08 MED ORDER — DEXAMETHASONE SODIUM PHOSPHATE 10 MG/ML IJ SOLN
INTRAMUSCULAR | Status: DC | PRN
  Administered 2018-11-08: 4 mg via INTRAVENOUS

## 2018-11-08 MED ORDER — FLUTICASONE PROPIONATE 50 MCG/ACT NA SUSP
2.0000 | Freq: Every day | NASAL | Status: DC
  Filled 2018-11-08: qty 16

## 2018-11-08 MED ORDER — VITAMIN E 180 MG (400 UNIT) PO CAPS
400.0000 [IU] | ORAL_CAPSULE | Freq: Every day | ORAL | Status: DC
  Administered 2018-11-09: 09:00:00 400 [IU] via ORAL
  Filled 2018-11-08: qty 1

## 2018-11-08 MED ORDER — METOCLOPRAMIDE HCL 5 MG PO TABS: 5.0000 mg | ORAL_TABLET | Freq: Three times a day (TID) | ORAL | Status: DC | PRN

## 2018-11-08 MED ORDER — ONDANSETRON HCL 4 MG/2ML IJ SOLN: 4.0000 mg | Freq: Four times a day (QID) | INTRAMUSCULAR | Status: DC | PRN

## 2018-11-08 MED ORDER — PHENOL 1.4 % MT LIQD: 1.0000 | OROMUCOSAL | Status: DC | PRN

## 2018-11-08 MED ORDER — PROPOFOL 10 MG/ML IV BOLUS
INTRAVENOUS | Status: DC | PRN
  Administered 2018-11-08: 20 mg via INTRAVENOUS
  Administered 2018-11-08: 10 mg via INTRAVENOUS
  Administered 2018-11-08: 20 mg via INTRAVENOUS

## 2018-11-08 MED ORDER — CHLORHEXIDINE GLUCONATE 4 % EX LIQD: 60.0000 mL | Freq: Once | CUTANEOUS | Status: DC

## 2018-11-08 MED ORDER — COQ10 100 MG PO CAPS: 100.0000 mg | ORAL_CAPSULE | Freq: Every day | ORAL | Status: DC

## 2018-11-08 MED ORDER — ONDANSETRON HCL 4 MG/2ML IJ SOLN
INTRAMUSCULAR | Status: DC | PRN
  Administered 2018-11-08: 4 mg via INTRAVENOUS

## 2018-11-08 MED ORDER — BUPIVACAINE HCL (PF) 0.25 % IJ SOLN
INTRAMUSCULAR | Status: AC
  Filled 2018-11-08: qty 30

## 2018-11-08 MED ORDER — POTASSIUM CHLORIDE CRYS ER 10 MEQ PO TBCR: 10.0000 meq | EXTENDED_RELEASE_TABLET | Freq: Every day | ORAL | Status: DC | PRN

## 2018-11-08 MED ORDER — CEFAZOLIN SODIUM-DEXTROSE 2-4 GM/100ML-% IV SOLN
2.0000 g | INTRAVENOUS | Status: AC
  Administered 2018-11-08: 2 g via INTRAVENOUS
  Filled 2018-11-08: qty 100

## 2018-11-08 MED ORDER — OXYCODONE-ACETAMINOPHEN 5-325 MG PO TABS: 1.0000 | ORAL_TABLET | ORAL | 0 refills | Status: DC | PRN

## 2018-11-08 MED ORDER — PANTOPRAZOLE SODIUM 40 MG PO TBEC
40.0000 mg | DELAYED_RELEASE_TABLET | Freq: Every day | ORAL | Status: DC
  Administered 2018-11-09: 09:00:00 40 mg via ORAL
  Filled 2018-11-08: qty 1

## 2018-11-08 SURGICAL SUPPLY — 55 items
ATTUNE MED DOME PAT 38 KNEE (Knees) ×1 IMPLANT
ATTUNE MED DOME PAT 38MM KNEE (Knees) ×1 IMPLANT
ATTUNE PS FEM LT SZ 7 CEM KNEE (Femur) ×2 IMPLANT
ATTUNE PSRP INSR SZ7 5 KNEE (Insert) ×1 IMPLANT
ATTUNE PSRP INSR SZ7 5MM KNEE (Insert) ×1 IMPLANT
BAG DECANTER FOR FLEXI CONT (MISCELLANEOUS) ×3 IMPLANT
BAG SPEC THK2 15X12 ZIP CLS (MISCELLANEOUS) ×1
BAG ZIPLOCK 12X15 (MISCELLANEOUS) ×3 IMPLANT
BASE TIBIA ATTUNE KNEE SYS SZ6 (Knees) IMPLANT
BLADE SAG 18X100X1.27 (BLADE) ×3 IMPLANT
BLADE SAW SGTL 11.0X1.19X90.0M (BLADE) ×3 IMPLANT
BLADE SURG SZ10 CARB STEEL (BLADE) ×6 IMPLANT
BNDG CMPR MED 10X6 ELC LF (GAUZE/BANDAGES/DRESSINGS) ×1
BNDG ELASTIC 6X10 VLCR STRL LF (GAUZE/BANDAGES/DRESSINGS) ×3 IMPLANT
BOWL SMART MIX CTS (DISPOSABLE) ×3 IMPLANT
BSPLAT TIB 6 CMNT ROT PLAT STR (Knees) ×1 IMPLANT
CEMENT HV SMART SET (Cement) ×6 IMPLANT
COVER SURGICAL LIGHT HANDLE (MISCELLANEOUS) ×3 IMPLANT
COVER WAND RF STERILE (DRAPES) IMPLANT
CUFF TOURN SGL QUICK 34 (TOURNIQUET CUFF) ×3
CUFF TRNQT CYL 34X4.125X (TOURNIQUET CUFF) ×1 IMPLANT
DECANTER SPIKE VIAL GLASS SM (MISCELLANEOUS) ×9 IMPLANT
DRAPE U-SHAPE 47X51 STRL (DRAPES) ×3 IMPLANT
DRSG AQUACEL AG ADV 3.5X10 (GAUZE/BANDAGES/DRESSINGS) ×3 IMPLANT
DURAPREP 26ML APPLICATOR (WOUND CARE) ×3 IMPLANT
ELECT REM PT RETURN 15FT ADLT (MISCELLANEOUS) ×3 IMPLANT
GLOVE BIO SURGEON STRL SZ7.5 (GLOVE) ×3 IMPLANT
GLOVE BIO SURGEON STRL SZ8.5 (GLOVE) ×3 IMPLANT
GLOVE BIOGEL PI IND STRL 8 (GLOVE) ×1 IMPLANT
GLOVE BIOGEL PI IND STRL 9 (GLOVE) ×1 IMPLANT
GLOVE BIOGEL PI INDICATOR 8 (GLOVE) ×2
GLOVE BIOGEL PI INDICATOR 9 (GLOVE) ×2
GOWN STRL REUS W/TWL XL LVL3 (GOWN DISPOSABLE) ×6 IMPLANT
HANDPIECE INTERPULSE COAX TIP (DISPOSABLE) ×3
HOOD PEEL AWAY FLYTE STAYCOOL (MISCELLANEOUS) ×9 IMPLANT
KIT TURNOVER KIT A (KITS) IMPLANT
NDL HYPO 21X1.5 SAFETY (NEEDLE) ×2 IMPLANT
NEEDLE HYPO 21X1.5 SAFETY (NEEDLE) ×6 IMPLANT
NS IRRIG 1000ML POUR BTL (IV SOLUTION) ×3 IMPLANT
PACK ICE MAXI GEL EZY WRAP (MISCELLANEOUS) ×3 IMPLANT
PACK TOTAL KNEE CUSTOM (KITS) ×3 IMPLANT
PIN DRILL FIX HALF THREAD (BIT) ×2 IMPLANT
PIN STEINMAN FIXATION KNEE (PIN) ×2 IMPLANT
PROTECTOR NERVE ULNAR (MISCELLANEOUS) ×3 IMPLANT
SET HNDPC FAN SPRY TIP SCT (DISPOSABLE) ×1 IMPLANT
SUT VIC AB 1 CTX 36 (SUTURE) ×3
SUT VIC AB 1 CTX36XBRD ANBCTR (SUTURE) ×1 IMPLANT
SUT VIC AB 3-0 CT1 27 (SUTURE) ×9
SUT VIC AB 3-0 CT1 TAPERPNT 27 (SUTURE) ×3 IMPLANT
SYR CONTROL 10ML LL (SYRINGE) ×6 IMPLANT
TIBIA ATTUNE KNEE SYS BASE SZ6 (Knees) ×3 IMPLANT
TRAY FOLEY MTR SLVR 16FR STAT (SET/KITS/TRAYS/PACK) ×3 IMPLANT
WATER STERILE IRR 1000ML POUR (IV SOLUTION) ×6 IMPLANT
WRAP KNEE MAXI GEL POST OP (GAUZE/BANDAGES/DRESSINGS) ×2 IMPLANT
YANKAUER SUCT BULB TIP 10FT TU (MISCELLANEOUS) ×3 IMPLANT

## 2018-11-08 NOTE — Transfer of Care (Signed)
Immediate Anesthesia Transfer of Care Note  Patient: Doris Zamora  Procedure(s) Performed: LEFT TOTAL KNEE ARTHROPLASTY (Left Knee)  Patient Location: PACU  Anesthesia Type:MAC and Spinal  Level of Consciousness: awake, alert , oriented and patient cooperative  Airway & Oxygen Therapy: Patient Spontanous Breathing and Patient connected to face mask oxygen  Post-op Assessment: Report given to RN, Post -op Vital signs reviewed and stable and Patient moving all extremities  Post vital signs: Reviewed and stable  Last Vitals:  Vitals Value Taken Time  BP 121/48 11/08/18 1148  Temp    Pulse 56 11/08/18 1148  Resp 12 11/08/18 1148  SpO2 100 % 11/08/18 1148  Vitals shown include unvalidated device data.  Last Pain:  Vitals:   11/08/18 0918  TempSrc:   PainSc: 0-No pain         Complications: No apparent anesthesia complications

## 2018-11-08 NOTE — Anesthesia Procedure Notes (Signed)
Anesthesia Regional Block: Adductor canal block   Pre-Anesthetic Checklist: ,, timeout performed, Correct Patient, Correct Site, Correct Laterality, Correct Procedure, Correct Position, site marked, Risks and benefits discussed, pre-op evaluation,  At surgeon's request and post-op pain management  Laterality: Left  Prep: Maximum Sterile Barrier Precautions used, chloraprep       Needles:  Injection technique: Single-shot  Needle Type: Echogenic Stimulator Needle     Needle Length: 9cm  Needle Gauge: 21     Additional Needles:   Procedures:,,,, ultrasound used (permanent image in chart),,,,  Narrative:  Start time: 11/08/2018 9:10 AM End time: 11/08/2018 9:20 AM Injection made incrementally with aspirations every 5 mL.  Performed by: Personally  Anesthesiologist: Roberts Gaudy, MD  Additional Notes: 20 cc 0.75% Naropin injected easily

## 2018-11-08 NOTE — Evaluation (Signed)
Physical Therapy Evaluation Patient Details Name: Doris Zamora MRN: [id number] DOB: 08-12-1930 Today's Date: 11/08/2018   History of Present Illness  s/p L TKA  Clinical Impression  Pt is s/p TKA resulting in the deficits listed below (see PT Problem List).  Pt amb ~ 73' with RW and min assist. Anticipate steady progress, pt is hopeful to d/c tomorrow. Has flight of stairs up and down.  * Pt will benefit from skilled PT to increase their independence and safety with mobility to allow discharge to the venue listed below.      Follow Up Recommendations Follow surgeon's recommendation for DC plan and follow-up therapies    Equipment Recommendations  Rolling walker with 5" wheels    Recommendations for Other Services       Precautions / Restrictions Precautions Precautions: Knee;Fall Restrictions Weight Bearing Restrictions: No Other Position/Activity Restrictions: WBAT      Mobility  Bed Mobility Overal bed mobility: Needs Assistance Bed Mobility: Supine to Sit     Supine to sit: Min guard     General bed mobility comments: for safety  Transfers Overall transfer level: Needs assistance Equipment used: Rolling walker (2 wheeled) Transfers: Sit to/from Stand Sit to Stand: Min assist         General transfer comment: cues for hand placement and RLE position  Ambulation/Gait Ambulation/Gait assistance: Min assist;Min guard Gait Distance (Feet): 50 Feet Assistive device: Rolling walker (2 wheeled) Gait Pattern/deviations: Step-to pattern;Decreased weight shift to left     General Gait Details: cues for sequence adn RW position  Stairs            Wheelchair Mobility    Modified Rankin (Stroke Patients Only)       Balance                                             Pertinent Vitals/Pain Pain Assessment: 0-10 Pain Score: 3  Pain Location: left knee Pain Descriptors / Indicators: Grimacing;Sore;Tightness Pain  Intervention(s): Limited activity within patient's tolerance;Monitored during session;Premedicated before session;Repositioned;Ice applied    Home Living Family/patient expects to be discharged to:: Private residence Living Arrangements: Children Available Help at Discharge: Family;Available 24 hours/day Type of Home: House Home Access: Stairs to enter Entrance Stairs-Rails: Right Entrance Stairs-Number of Steps: 2 Home Layout: Multi-level Home Equipment: Cane - single point;Shower seat Additional Comments: needs RW per pt    Prior Function Level of Independence: Independent               Hand Dominance        Extremity/Trunk Assessment   Upper Extremity Assessment Upper Extremity Assessment: Overall WFL for tasks assessed    Lower Extremity Assessment Lower Extremity Assessment: LLE deficits/detail LLE Deficits / Details: ankle WFL, knee extension and hip flexion 3/5; AAROM grossly 5 to 65 degrees knee flexion       Communication   Communication: No difficulties  Cognition Arousal/Alertness: Awake/alert Behavior During Therapy: WFL for tasks assessed/performed Overall Cognitive Status: Within Functional Limits for tasks assessed                                        General Comments      Exercises Total Joint Exercises Ankle Circles/Pumps: AROM;Both;10 reps Quad Sets: AROM;Both;10 reps   Assessment/Plan  PT Assessment Patient needs continued PT services  PT Problem List Decreased strength;Decreased range of motion;Decreased mobility;Decreased activity tolerance;Decreased balance;Pain;Decreased knowledge of use of DME       PT Treatment Interventions DME instruction;Gait training;Functional mobility training;Therapeutic activities;Therapeutic exercise;Patient/family education;Stair training    PT Goals (Current goals can be found in the Care Plan section)  Acute Rehab PT Goals PT Goal Formulation: With patient Time For Goal  Achievement: 11/15/18 Potential to Achieve Goals: Good    Frequency 7X/week   Barriers to discharge        Co-evaluation               AM-PAC PT "6 Clicks" Mobility  Outcome Measure Help needed turning from your back to your side while in a flat bed without using bedrails?: A Little Help needed moving from lying on your back to sitting on the side of a flat bed without using bedrails?: A Little Help needed moving to and from a bed to a chair (including a wheelchair)?: A Little Help needed standing up from a chair using your arms (e.g., wheelchair or bedside chair)?: A Little Help needed to walk in hospital room?: A Little Help needed climbing 3-5 steps with a railing? : A Lot 6 Click Score: 17    End of Session Equipment Utilized During Treatment: Gait belt Activity Tolerance: Patient tolerated treatment well Patient left: in chair;with call bell/phone within reach;with chair alarm set;with family/visitor present   PT Visit Diagnosis: Difficulty in walking, not elsewhere classified (R26.2)    Time: 2355-7322 PT Time Calculation (min) (ACUTE ONLY): 24 min   Charges:   PT Evaluation $PT Eval Low Complexity: 1 Low PT Treatments $Gait Training: 8-22 mins        Kenyon Ana, PT  Pager: [phone] Acute Rehab Dept [facility]): [phone]   11/08/2018   [facility] 11/08/2018, 5:57 PM

## 2018-11-08 NOTE — Anesthesia Preprocedure Evaluation (Addendum)
Anesthesia Evaluation  Patient identified by MRN, date of birth, ID band Patient awake    Reviewed: Allergy & Precautions, NPO status , Patient's Chart, lab work & pertinent test results  Airway Mallampati: II  TM Distance: >3 FB Neck ROM: Full    Dental  (+) Edentulous Upper, Edentulous Lower   Pulmonary former smoker,    breath sounds clear to auscultation       Cardiovascular  Rhythm:Regular Rate:Normal     Neuro/Psych    GI/Hepatic   Endo/Other    Renal/GU      Musculoskeletal   Abdominal   Peds  Hematology   Anesthesia Other Findings   Reproductive/Obstetrics                            Anesthesia Physical Anesthesia Plan  ASA: III  Anesthesia Plan: Spinal and MAC   Post-op Pain Management:  Regional for Post-op pain   Induction:   PONV Risk Score and Plan: Ondansetron and Dexamethasone  Airway Management Planned: Natural Airway and Simple Face Mask  Additional Equipment:   Intra-op Plan:   Post-operative Plan:   Informed Consent: I have reviewed the patients History and Physical, chart, labs and discussed the procedure including the risks, benefits and alternatives for the proposed anesthesia with the patient or authorized representative who has indicated his/her understanding and acceptance.       Plan Discussed with: CRNA and Anesthesiologist  Anesthesia Plan Comments:         Anesthesia Quick Evaluation

## 2018-11-08 NOTE — Interval H&P Note (Signed)
History and Physical Interval Note:  11/08/2018 9:38 AM  Doris Zamora  has presented today for surgery, with the diagnosis of LEFT KNEE OSTEOARTHRITIS.  The various methods of treatment have been discussed with the patient and family. After consideration of risks, benefits and other options for treatment, the patient has consented to  Procedure(s): LEFT TOTAL KNEE ARTHROPLASTY (Left) as a surgical intervention.  The patient's history has been reviewed, patient examined, no change in status, stable for surgery.  I have reviewed the patient's chart and labs.  Questions were answered to the patient's satisfaction.     [name]

## 2018-11-08 NOTE — Discharge Instructions (Signed)

## 2018-11-08 NOTE — Progress Notes (Signed)
AssistedDr. Joslin with left, ultrasound guided, adductor canal block. Side rails up, monitors on throughout procedure. See vital signs in flow sheet. Tolerated Procedure well.  

## 2018-11-08 NOTE — Op Note (Signed)
PATIENT ID:      Doris Zamora  MRN:     [id number] DOB/AGE:    83-Oct-1932 / 83 y.o.       OPERATIVE REPORT   DATE OF PROCEDURE:  11/08/2018      PREOPERATIVE DIAGNOSIS:   LEFT KNEE OSTEOARTHRITIS      Estimated body mass index is 31.6 kg/m as calculated from the following:   Height as of 11/03/18: 5\' 6"  (1.676 m).   Weight as of 11/03/18: 88.8 kg.                                                       POSTOPERATIVE DIAGNOSIS:  L Knee OA                                                                  PROCEDURE:  Procedure(s): LEFT TOTAL KNEE ARTHROPLASTY Using DepuyAttune RP implants #7L Femur, #6Tibia, 5 mm Attune RP bearing, 38 Patella    SURGEON: [name]  ASSISTANT:   Kerry Hough. [employer]   (Present and scrubbed throughout the case, critical for assistance with exposure, retraction, instrumentation, and closure.)        ANESTHESIA: Spinal, 20cc Exparel, 50cc 0.25% Marcaine EBL: 300 cc FLUID REPLACEMENT: 1600 cc crystaloid TOURNIQUET: DRAINS: None TRANEXAMIC ACID: 1gm IV, 2gm topical COMPLICATIONS:  None         INDICATIONS FOR PROCEDURE: The patient has  LEFT KNEE OSTEOARTHRITIS, Var deformities, XR shows bone on bone arthritis, lateral subluxation of tibia. Patient has failed all conservative measures including anti-inflammatory medicines, narcotics, attempts at exercise and weight loss, cortisone injections and viscosupplementation.  Risks and benefits of surgery have been discussed, questions answered.   DESCRIPTION OF PROCEDURE: The patient identified by armband, received  IV antibiotics, in the holding area at [facility]. Patient taken to the operating room, appropriate anesthetic monitors were attached, and Spinal anesthesia was  induced. IV Tranexamic acid was given.Tourniquet applied high to the operative thigh. Lateral post and foot positioner applied to the table, the lower extremity was then prepped and draped in usual sterile fashion from the toes to the  tourniquet. Time-out procedure was performed. The skin and subcutaneous tissue along the incision was injected with 20 cc of a mixture of Exparel and Marcaine solution, using a 20-gauge by 1-1/2 inch needle. We began the operation, with the knee flexed 130 degrees, by making the anterior midline incision starting at handbreadth above the patella going over the patella 1 cm medial to and 4 cm distal to the tibial tubercle. Small bleeders in the skin and the subcutaneous tissue identified and cauterized. Transverse retinaculum was incised and reflected medially and a medial parapatellar arthrotomy was accomplished. the patella was everted and theprepatellar fat pad resected. The superficial medial collateral ligament was then elevated from anterior to posterior along the proximal flare of the tibia and anterior half of the menisci resected. The knee was hyperflexed exposing bone on bone arthritis. Peripheral and notch osteophytes as well as the cruciate ligaments were then resected. We continued to work our way around posteriorly along  the proximal tibia, and externally rotated the tibia subluxing it out from underneath the femur. A McHale PCL retractor was placed through the notch and a lateral Hohmann retractor placed, and we then entered the proximal tibia in line with the Depuy starter drill in line with the axis of the tibia followed by an intramedullary guide rod and 0-degree posterior slope cutting guide. The tibial cutting guide, 4 degree posterior sloped, was pinned into place allowing resection of 4 mm of bone medially and 12 mm of bone laterally. Satisfied with the tibial resection, we then entered the distal femur 2 mm anterior to the PCL origin with the intramedullary guide rod and applied the distal femoral cutting guide set at 9 mm, with 5 degrees of valgus. This was pinned along the epicondylar axis. At this point, the distal femoral cut was accomplished without difficulty. We then sized for a #7L  femoral component and pinned the guide in 3 degrees of external rotation. The chamfer cutting guide was pinned into place. The anterior, posterior, and chamfer cuts were accomplished without difficulty followed by the Attune RP box cutting guide and the box cut. We also removed posterior osteophytes from the posterior femoral condyles. The posterior capsule was injected with Exparel solution. The knee was brought into full extension. We checked our extension gap and fit a 5 mm bearing. Distracting in extension with a lamina spreader,  bleeders in the posterior capsule, Posterior medial and posterior lateral gutter were cauterized.  The transexamic acid-soaked sponge was then placed in the gap of the knee in extension. The knee was flexed 30. The posterior patella cut was accomplished with the 9.5 mm Attune cutting guide, sized for a 57mm dome, and the fixation pegs drilled.The knee was then once again hyperflexed exposing the proximal tibia. We sized for a # 6 tibial base plate, applied the smokestack and the conical reamer followed by the the Delta fin keel punch. We then hammered into place the Attune RP trial femoral component, drilled the lugs, inserted a  5 mm trial bearing, trial patellar button, and took the knee through range of motion from 0-130 degrees. Medial and lateral ligamentous stability was checked. No thumb pressure was required for patellar Tracking. The tourniquet was not used. All trial components were removed, mating surfaces irrigated with pulse lavage, and dried with suction and sponges. 10 cc of the Exparel solution was applied to the cancellus bone of the patella distal femur and proximal tibia.  After waiting 30 seconds, the bony surfaces were again, dried with sponges. A double batch of DePuy HV cement was mixed and applied to all bony metallic mating surfaces except for the posterior condyles of the femur itself. In order, we hammered into place the tibial tray and removed excess  cement, the femoral component and removed excess cement. The final Attune RP bearing was inserted, and the knee brought to full extension with compression. The patellar button was clamped into place, and excess cement removed. The knee was held at 30 flexion with compression, while the cement cured. The wound was irrigated out with normal saline solution pulse lavage. The rest of the Exparel was injected into the parapatellar arthrotomy, subcutaneous tissues, and periosteal tissues. The parapatellar arthrotomy was closed with running #1 Vicryl suture. The subcutaneous tissue with 0 and 2-0 undyed Vicryl suture, and the skin with running 3-0 SQ vicryl. An Aquacil and Ace wrap were applied. The patient was taken to recovery room without difficulty.   [name] 11/08/2018,  6:39 AM

## 2018-11-08 NOTE — Anesthesia Postprocedure Evaluation (Signed)
Anesthesia Post Note  Patient: Doris Zamora  Procedure(s) Performed: LEFT TOTAL KNEE ARTHROPLASTY (Left Knee)     Patient location during evaluation: PACU Anesthesia Type: MAC Level of consciousness: oriented and awake and alert Pain management: pain level controlled Vital Signs Assessment: post-procedure vital signs reviewed and stable Respiratory status: spontaneous breathing, respiratory function stable and patient connected to nasal cannula oxygen Cardiovascular status: blood pressure returned to baseline and stable Postop Assessment: no headache, no backache and no apparent nausea or vomiting Anesthetic complications: no    Last Vitals:  Vitals:   11/08/18 1200 11/08/18 1215  BP: (!) 141/65 (!) 164/69  Pulse: (!) 57 (!) 57  Resp: 13 13  Temp:    SpO2: 100% 100%    Last Pain:  Vitals:   11/08/18 1215  TempSrc:   PainSc: 0-No pain                 Mahli Glahn COKER

## 2018-11-08 NOTE — Anesthesia Procedure Notes (Signed)
Spinal  Patient location during procedure: OR Start time: 11/08/2018 9:50 AM End time: 11/08/2018 10:00 AM Staffing Anesthesiologist: Roberts Gaudy, MD Performed: anesthesiologist  Preanesthetic Checklist Completed: patient identified, site marked, surgical consent, pre-op evaluation, timeout performed, IV checked, risks and benefits discussed and monitors and equipment checked Spinal Block Patient position: sitting Prep: DuraPrep Patient monitoring: heart rate, cardiac monitor, continuous pulse ox and blood pressure Approach: midline Location: L3-4 Injection technique: single-shot Needle Needle type: Sprotte  Needle gauge: 24 G Needle length: 9 cm Assessment Sensory level: T4 Additional Notes 22 g L3-4 R paramedian free CSF flow 1.7 0.75% Bupivacaine injected easily

## 2018-11-09 ENCOUNTER — Encounter (HOSPITAL_COMMUNITY): Payer: Self-pay | Admitting: Orthopedic Surgery

## 2018-11-09 DIAGNOSIS — M1712 Unilateral primary osteoarthritis, left knee: Secondary | ICD-10-CM | POA: Diagnosis not present

## 2018-11-09 LAB — CBC
HCT: 34.6 % — ABNORMAL LOW (ref 36.0–46.0)
Hemoglobin: 10.8 g/dL — ABNORMAL LOW (ref 12.0–15.0)
MCH: 30.6 pg (ref 26.0–34.0)
MCHC: 31.2 g/dL (ref 30.0–36.0)
MCV: 98 fL (ref 80.0–100.0)
Platelets: 197 10*3/uL (ref 150–400)
RBC: 3.53 MIL/uL — ABNORMAL LOW (ref 3.87–5.11)
RDW: 13.7 % (ref 11.5–15.5)
WBC: 9 10*3/uL (ref 4.0–10.5)
nRBC: 0 % (ref 0.0–0.2)

## 2018-11-09 LAB — BASIC METABOLIC PANEL
Anion gap: 7 (ref 5–15)
BUN: 13 mg/dL (ref 8–23)
CO2: 23 mmol/L (ref 22–32)
Calcium: 8.3 mg/dL — ABNORMAL LOW (ref 8.9–10.3)
Chloride: 103 mmol/L (ref 98–111)
Creatinine, Ser: 0.59 mg/dL (ref 0.44–1.00)
GFR calc Af Amer: >60 mL/min (ref >60)
GFR calc non Af Amer: >60 mL/min (ref >60)
Glucose, Bld: 130 mg/dL — ABNORMAL HIGH (ref 70–99)
Potassium: 4.2 mmol/L (ref 3.5–5.1)
Sodium: 133 mmol/L — ABNORMAL LOW (ref 135–145)

## 2018-11-09 NOTE — Discharge Summary (Signed)
Patient ID: Doris Zamora MRN: [id number] DOB/AGE: [birth year] 83 y.o.  Admit date: 11/08/2018 Discharge date: 11/09/2018  Admission Diagnoses:  Principal Problem:   Degenerative arthritis of left knee Active Problems:   Status post total left knee replacement   Discharge Diagnoses:  Same  Past Medical History:  Diagnosis Date  . ANXIETY   . Arthritis   . Endometrial cancer ([place]) 2007  . Hearing loss   . Heartburn    occ  . Hyperglycemia 04/20/2014  . HYPERLIPIDEMIA   . Hypothyroidism   . INSOMNIA-SLEEP DISORDER-UNSPEC   . Peripheral edema   . Seasonal allergies   . Venous insufficiency   . WEIGHT LOSS     Surgeries: Procedure(s): LEFT TOTAL KNEE ARTHROPLASTY on 11/08/2018   Consultants:   Discharged Condition: Improved  Hospital Course: CHARITI HAVEL is an 85 y.o. female who was admitted 11/08/2018 for operative treatment ofDegenerative arthritis of left knee. Patient has severe unremitting pain that affects sleep, daily activities, and work/hobbies. After pre-op clearance the patient was taken to the operating room on 11/08/2018 and underwent  Procedure(s): LEFT TOTAL KNEE ARTHROPLASTY.    Patient was given perioperative antibiotics:  Anti-infectives (From admission, onward)   Start     Dose/Rate Route Frequency Ordered Stop   11/08/18 0830  ceFAZolin (ANCEF) IVPB 2g/100 mL premix     2 g 200 mL/hr over 30 Minutes Intravenous On call to O.R. 11/08/18 0824 11/08/18 1001       Patient was given sequential compression devices, early ambulation, and chemoprophylaxis to prevent DVT.  Patient benefited maximally from hospital stay and there were no complications.    Recent vital signs:  Patient Vitals for the past 24 hrs:  BP Temp Temp src Pulse Resp SpO2 Height Weight  11/09/18 0435 (Abnormal) 139/59 99.1 F (37.3 C) Oral 78 16 96 % no documentation no documentation  11/09/18 0058 (Abnormal) 142/63 98.7 F (37.1 C) Oral 75 16 98 % no documentation no  documentation  11/08/18 2113 (Abnormal) 150/56 97.9 F (36.6 C) Oral 74 16 99 % no documentation no documentation  11/08/18 1806 (Abnormal) 172/61 (Abnormal) 97.3 F (36.3 C) Oral 84 15 100 % no documentation no documentation  11/08/18 1650 (Abnormal) 142/58 97.7 F (36.5 C) Oral 88 15 100 % no documentation no documentation  11/08/18 1604 (Abnormal) 145/59 97.7 F (36.5 C) Oral 86 16 100 % no documentation no documentation  11/08/18 1456 (Abnormal) 152/56 (Abnormal) 97.4 F (36.3 C) Oral 68 15 100 % no documentation no documentation  11/08/18 1445 no documentation (Abnormal) 97.5 F (36.4 C) no documentation (Abnormal) 58 11 100 % no documentation no documentation  11/08/18 1415 (Abnormal) 176/63 no documentation no documentation 65 15 100 % no documentation no documentation  11/08/18 1400 (Abnormal) 174/63 no documentation no documentation 62 15 100 % no documentation no documentation  11/08/18 1345 (Abnormal) 174/63 no documentation no documentation 66 13 100 % no documentation no documentation  11/08/18 1330 (Abnormal) 172/60 no documentation no documentation (Abnormal) 54 13 100 % no documentation no documentation  11/08/18 1315 (Abnormal) 194/75 no documentation no documentation (Abnormal) 53 13 100 % no documentation no documentation  11/08/18 1300 (Abnormal) 189/74 no documentation no documentation (Abnormal) 57 13 100 % no documentation no documentation  11/08/18 1254 (Abnormal) 192/74 no documentation no documentation no documentation no documentation no documentation no documentation no documentation  11/08/18 1245 (Abnormal) 192/74 no documentation no documentation (Abnormal) 57 14 100 % no documentation no documentation  11/08/18 1230 (Abnormal) 177/70  no documentation no documentation (Abnormal) 57 11 100 % no documentation no documentation  11/08/18 1215 (Abnormal) 164/69 no documentation no documentation (Abnormal) 57 13 100 % no documentation no documentation  11/08/18 1200  (Abnormal) 141/65 no documentation no documentation (Abnormal) 57 13 100 % no documentation no documentation  11/08/18 1148 (Abnormal) 121/48 (Abnormal) 96.8 F (36 C) no documentation (Abnormal) 59 (Abnormal) 9 100 % no documentation no documentation  11/08/18 0918 no documentation no documentation no documentation 65 13 100 % no documentation no documentation  11/08/18 0917 no documentation no documentation no documentation 65 14 100 % no documentation no documentation  11/08/18 0916 (Abnormal) 149/58 no documentation no documentation 66 13 100 % no documentation no documentation  11/08/18 0915 no documentation no documentation no documentation 65 13 100 % no documentation no documentation  11/08/18 0914 (Abnormal) 155/61 no documentation no documentation 65 14 100 % no documentation no documentation  11/08/18 0913 no documentation no documentation no documentation 67 18 100 % no documentation no documentation  11/08/18 0912 no documentation no documentation no documentation 65 14 100 % no documentation no documentation  11/08/18 0911 no documentation no documentation no documentation 65 14 100 % no documentation no documentation  11/08/18 0910 (Abnormal) 156/59 no documentation no documentation 64 14 100 % no documentation no documentation  11/08/18 0909 no documentation no documentation no documentation 66 16 100 % no documentation no documentation  11/08/18 0908 no documentation no documentation no documentation 66 18 100 % no documentation no documentation  11/08/18 0907 no documentation no documentation no documentation 70 18 100 % no documentation no documentation  11/08/18 0906 (Abnormal) 154/66 no documentation no documentation 63 18 100 % no documentation no documentation  11/08/18 0905 no documentation no documentation no documentation 66 17 100 % no documentation no documentation  11/08/18 0904 no documentation no documentation no documentation 63 17 100 % no documentation no  documentation  11/08/18 0903 no documentation no documentation no documentation 64 20 100 % no documentation no documentation  11/08/18 0902 no documentation no documentation no documentation 66 16 100 % no documentation no documentation  11/08/18 0901 no documentation no documentation no documentation 67 (Abnormal) 22 100 % no documentation no documentation  11/08/18 0843 (Abnormal) 158/69 97.9 F (36.6 C) Oral 71 18 100 % no documentation no documentation  11/08/18 0824 no documentation no documentation no documentation no documentation no documentation no documentation 5\' 6"  (1.676 m) 88.8 kg     Recent laboratory studies:  Recent Labs    11/09/18 0251  WBC 9.0  HGB 10.8*  HCT 34.6*  PLT 197  NA 133*  K 4.2  CL 103  CO2 23  BUN 13  CREATININE 0.59  GLUCOSE 130*  CALCIUM 8.3*     Discharge Medications:   Allergies as of 11/09/2018    Allergen Reactions Comment   Synthroid [levothyroxine Sodium] Swelling Happened yrs ago   Pneumovax [pneumococcal Polysaccharide Vaccine] Swelling       Medication List    Take these medications   ALPRAZolam 0.5 MG tablet Commonly known as: XANAX TAKE 1 TABLET(0.5 MG) BY MOUTH DAILY AS NEEDED What changed: See the new instructions.   anastrozole 1 MG tablet Commonly known as: ARIMIDEX TAKE 1 TABLET(1 MG) BY MOUTH DAILY What changed: See the new instructions.   aspirin EC 81 MG tablet Take 1 tablet (81 mg total) by mouth 2 (two) times daily.   BIOFREEZE EX Apply 1 patch topically daily as needed (pain).   BLACK  CURRANT SEED OIL PO Take 1 Dose by mouth daily.   cetirizine 10 MG tablet Commonly known as: ZYRTEC TAKE 1 TABLET(10 MG) BY MOUTH DAILY What changed: See the new instructions.   cholecalciferol 1000 units tablet Commonly known as: VITAMIN D Take 1,000 Units by mouth daily.   CLEAR EYES OP Place 1 drop into both eyes daily as needed (dry eyes).   CoQ10 100 MG Caps Take 100 mg by mouth daily.   fluticasone 50  MCG/ACT nasal spray Commonly known as: Flonase Place 2 sprays into both nostrils daily.   furosemide 20 MG tablet Commonly known as: LASIX TAKE 1/2 TO 1 TABLET BY MOUTH DAILY AS NEEDED FOR SWELLING What changed:   how much to take  how to take this  when to take this  reasons to take this  additional instructions   Krill Oil 350 MG Caps Take 350 mg by mouth daily.   OVER THE COUNTER MEDICATION Take 1 tablet by mouth daily. Instaflex otc supplement   oxyCODONE-acetaminophen 5-325 MG tablet Commonly known as: PERCOCET/ROXICET Take 1 tablet by mouth every 4 (four) hours as needed for severe pain.   potassium chloride 10 MEQ tablet Commonly known as: K-DUR TAKE 1 TABLET BY MOUTH EVERY DAY AS NEEDED WITH USE OF LASIX What changed:   how much to take  how to take this  when to take this  reasons to take this  additional instructions   Selenium 200 MCG Caps Take 200 mcg by mouth daily.   tiZANidine 2 MG tablet Commonly known as: ZANAFLEX Take 1 tablet (2 mg total) by mouth every 6 (six) hours as needed.   vitamin B-1 250 MG tablet Take 250 mg by mouth daily.   vitamin B-12 1000 MCG tablet Commonly known as: CYANOCOBALAMIN Take 1,000 mcg by mouth daily.   vitamin E 400 UNIT capsule Take 400 Units by mouth daily.        Durable Medical Equipment  (From admission, onward)         Start     Ordered   11/08/18 1454  DME Walker rolling  Once    Question:  Patient needs a walker to treat with the following condition  Answer:  Status post total left knee replacement   11/08/18 1454   11/08/18 1454  DME 3 n 1  Once     11/08/18 1454           Discharge Care Instructions  (From admission, onward)         Start     Ordered   11/09/18 0000  Change dressing    Comments: Change dressing Only if drainage exceeds 40% of window on dressing   11/09/18 0623          Diagnostic Studies: Dg Chest 2 View  Result Date: 11/04/2018 CLINICAL DATA:   Preoperative for knee arthroplasty. EXAM: CHEST - 2 VIEW COMPARISON:  01/26/2018 chest radiograph. FINDINGS: Surgical clips overlie the medial lower right neck. Stable cardiomediastinal silhouette with normal heart size. No pneumothorax. No pleural effusion. Lungs appear clear, with no acute consolidative airspace disease and no pulmonary edema. IMPRESSION: No active cardiopulmonary disease. Electronically Signed   By: Ilona Sorrel M.D.   On: 11/04/2018 08:07    Disposition: Discharge disposition: 01-Home or Self Care       Discharge Instructions    Call MD / Call 911   Complete by: As directed    If you experience chest pain or shortness of breath,  CALL 911 and be transported to the hospital emergency room.  If you develope a fever above 101 F, pus (white drainage) or increased drainage or redness at the wound, or calf pain, call your surgeon's office.   Change dressing   Complete by: As directed    Change dressing Only if drainage exceeds 40% of window on dressing   Constipation Prevention   Complete by: As directed    Drink plenty of fluids.  Prune juice may be helpful.  You may use a stool softener, such as Colace (over the counter) 100 mg twice a day.  Use MiraLax (over the counter) for constipation as needed.   Diet - low sodium heart healthy   Complete by: As directed    Increase activity slowly as tolerated   Complete by: As directed       Follow-up Information    Frederik Pear, MD In 2 weeks.   Specialty: Orthopedic Surgery Contact information: [place] [id number] [phone]            Signed: [name] 11/09/2018, 6:24 AM

## 2018-11-09 NOTE — Progress Notes (Signed)
Physical Therapy Treatment Patient Details Name: Doris Zamora MRN: [id number] DOB: May 15, 1930 Today's Date: 11/09/2018    History of Present Illness s/p L TKA    PT Comments    Pt progressing. Will need a second session to reinforce gait safety and stairs.   Follow Up Recommendations  Follow surgeon's recommendation for DC plan and follow-up therapies     Equipment Recommendations  Rolling walker with 5" wheels    Recommendations for Other Services       Precautions / Restrictions Precautions Precautions: Knee;Fall Restrictions Weight Bearing Restrictions: No Other Position/Activity Restrictions: WBAT    Mobility  Bed Mobility Overal bed mobility: Needs Assistance Bed Mobility: Supine to Sit     Supine to sit: Min guard     General bed mobility comments: for safety  Transfers Overall transfer level: Needs assistance Equipment used: Rolling walker (2 wheeled) Transfers: Sit to/from Stand Sit to Stand: Min assist         General transfer comment: cues for hand placement and RLE position  Ambulation/Gait Ambulation/Gait assistance: Min assist;Min guard Gait Distance (Feet): 100 Feet Assistive device: Rolling walker (2 wheeled) Gait Pattern/deviations: Step-to pattern;Decreased weight shift to left     General Gait Details:  repetitious cues for sequence, RW position from self and step length   Stairs             Wheelchair Mobility    Modified Rankin (Stroke Patients Only)       Balance                                            Cognition Arousal/Alertness: Awake/alert Behavior During Therapy: WFL for tasks assessed/performed Overall Cognitive Status: Within Functional Limits for tasks assessed                                        Exercises Total Joint Exercises Ankle Circles/Pumps: AROM;Both;10 reps Quad Sets: AROM;Both;10 reps Heel Slides: AAROM;Left;10 reps Hip ABduction/ADduction:  AROM;AAROM;Left;10 reps Straight Leg Raises: AROM;AAROM;Left;10 reps Goniometric ROM: grossly 5 to 65 degrees left knee flexion    General Comments        Pertinent Vitals/Pain Pain Assessment: 0-10 Pain Score: 4  Pain Location: left knee Pain Descriptors / Indicators: Grimacing;Sore;Tightness Pain Intervention(s): Limited activity within patient's tolerance;Monitored during session;Premedicated before session;Repositioned    Home Living                      Prior Function            PT Goals (current goals can now be found in the care plan section) Acute Rehab PT Goals PT Goal Formulation: With patient Time For Goal Achievement: 11/15/18 Potential to Achieve Goals: Good Progress towards PT goals: Progressing toward goals    Frequency    7X/week      PT Plan Current plan remains appropriate    Co-evaluation              AM-PAC PT "6 Clicks" Mobility   Outcome Measure  Help needed turning from your back to your side while in a flat bed without using bedrails?: A Little Help needed moving from lying on your back to sitting on the side of a flat bed without using bedrails?: A Little Help needed  moving to and from a bed to a chair (including a wheelchair)?: A Little Help needed standing up from a chair using your arms (e.g., wheelchair or bedside chair)?: A Little Help needed to walk in hospital room?: A Little Help needed climbing 3-5 steps with a railing? : A Lot 6 Click Score: 17    End of Session Equipment Utilized During Treatment: Gait belt Activity Tolerance: Patient tolerated treatment well Patient left: in chair;with call bell/phone within reach;with chair alarm set Nurse Communication: Mobility status PT Visit Diagnosis: Difficulty in walking, not elsewhere classified (R26.2)     Time: 1660-6004 PT Time Calculation (min) (ACUTE ONLY): 16 min  Charges:  $Gait Training: 8-22 mins                     Kenyon Ana, PT  Pager:  [phone] Acute Rehab Dept [facility]): [phone]   11/09/2018    [facility] 11/09/2018, 11:27 AM

## 2018-11-09 NOTE — Progress Notes (Addendum)
   11/09/18 1400  PT Visit Information  Last PT Received On 11/09/18  RN spoke with pt dtr (pt requiring mod assist to go up down 2 steps), pt dtr able to provide 24 hr assist at home and able to drive pt to basement level and enter with no stairs; OK to d/c with 24 hr assist;  Assistance Needed +1  History of Present Illness s/p L TKA  Precautions  Precautions Knee;Fall  Restrictions  Weight Bearing Restrictions No  Other Position/Activity Restrictions WBAT  Pain Assessment  Pain Assessment 0-10  Pain Score 4  Pain Location left knee  Pain Descriptors / Indicators Grimacing;Sore;Tightness  Pain Intervention(s) Limited activity within patient's tolerance;Monitored during session;Premedicated before session;Repositioned  Cognition  Arousal/Alertness Awake/alert  Behavior During Therapy WFL for tasks assessed/performed  Overall Cognitive Status Within Functional Limits for tasks assessed  Transfers  Overall transfer level Needs assistance  Equipment used Rolling walker (2 wheeled)  Transfers Sit to/from Stand  Sit to Stand Min assist;Min guard  General transfer comment cues for hand placement and RLE position  Ambulation/Gait  Ambulation/Gait assistance Min assist;Min guard  Gait Distance (Feet) 80 Feet  Assistive device Rolling walker (2 wheeled)  Gait Pattern/deviations Step-to pattern;Decreased weight shift to left  General Gait Details  repetitious cues for sequence, RW position from self and step length  Stairs Yes  Stairs assistance Min assist;Mod assist  Stair Management No rails;Step to pattern;Backwards;With walker  Number of Stairs 2  General stair comments cues for sequence and technique   Balance  Overall balance assessment Needs assistance  Standing balance support During functional activity;Bilateral upper extremity supported  Standing balance-Leahy Scale Poor  Standing balance comment reliant on UEs for static balance  PT - End of Session  Equipment Utilized  During Treatment Gait belt  Activity Tolerance Patient tolerated treatment well  Patient left in chair;with call bell/phone within reach;with chair alarm set  Nurse Communication Mobility status   PT - Assessment/Plan  PT Plan Current plan remains appropriate  PT Visit Diagnosis Difficulty in walking, not elsewhere classified (R26.2)  PT Frequency (ACUTE ONLY) 7X/week  Follow Up Recommendations Follow surgeon's recommendation for DC plan and follow-up therapies  PT equipment Rolling walker with 5" wheels  AM-PAC PT "6 Clicks" Mobility Outcome Measure (Version 2)  Help needed turning from your back to your side while in a flat bed without using bedrails? 3  Help needed moving from lying on your back to sitting on the side of a flat bed without using bedrails? 3  Help needed moving to and from a bed to a chair (including a wheelchair)? 3  Help needed standing up from a chair using your arms (e.g., wheelchair or bedside chair)? 3  Help needed to walk in hospital room? 3  Help needed climbing 3-5 steps with a railing?  2  6 Click Score 17  Consider Recommendation of Discharge To: Home with [facility]  PT Goal Progression  Progress towards PT goals Progressing toward goals  Acute Rehab PT Goals  PT Goal Formulation With patient  Time For Goal Achievement 11/15/18  Potential to Achieve Goals Good  PT Time Calculation  PT Start Time (ACUTE ONLY) 1432  PT Stop Time (ACUTE ONLY) 1449  PT Time Calculation (min) (ACUTE ONLY) 17 min  PT General Charges  $$ ACUTE PT VISIT 1 Visit  PT Treatments  $Gait Training 8-22 mins

## 2018-11-09 NOTE — Progress Notes (Signed)
PATIENT ID: Doris Zamora  MRN: [id number]  DOB/AGE:  [birth year] / 83 y.o.  1 Day Post-Op Procedure(s) (LRB): LEFT TOTAL KNEE ARTHROPLASTY (Left)    PROGRESS NOTE Subjective: Patient is alert, oriented, no Nausea, no Vomiting, yes passing gas. Taking PO well. Denies SOB, Chest or Calf Pain. Using Incentive Spirometer, PAS in place. Ambulate 50', Patient reports pain as 2/10 .    Objective: Vital signs in last 24 hours: Vitals:   11/08/18 1806 11/08/18 2113 11/09/18 0058 11/09/18 0435  BP: (Abnormal) 172/61 (Abnormal) 150/56 (Abnormal) 142/63 (Abnormal) 139/59  Pulse: 84 74 75 78  Resp: 15 16 16 16   Temp: (Abnormal) 97.3 F (36.3 C) 97.9 F (36.6 C) 98.7 F (37.1 C) 99.1 F (37.3 C)  TempSrc: Oral Oral Oral Oral  SpO2: 100% 99% 98% 96%  Weight:      Height:          Intake/Output from previous day: I/O last 3 completed shifts: In: 1935.2 [P.O.:240; I.V.:1395.2; IV Piggyback:300] Out: 2350 [Urine:2200; Blood:150]   Intake/Output this shift: Total I/O In: 1479.2 [P.O.:360; I.V.:1119.2] Out: 900 [Urine:900]   LABORATORY DATA: Recent Labs    11/09/18 0251  WBC 9.0  HGB 10.8*  HCT 34.6*  PLT 197  NA 133*  K 4.2  CL 103  CO2 23  BUN 13  CREATININE 0.59  GLUCOSE 130*  CALCIUM 8.3*    Examination: Neurologically intact ABD soft Neurovascular intact Sensation intact distally Intact pulses distally Dorsiflexion/Plantar flexion intact Incision: dressing C/D/I No cellulitis present Compartment soft}  Assessment:   1 Day Post-Op Procedure(s) (LRB): LEFT TOTAL KNEE ARTHROPLASTY (Left) ADDITIONAL DIAGNOSIS: Expected Acute Blood Loss Anemia,   Patient's anticipated LOS is less than 2 midnights, meeting these requirements: - Younger than 97 - Lives within 1 hour of care - Has a competent adult at home to recover with post-op recover - NO history of  - Chronic pain requiring opiods  - Diabetes  - Coronary Artery Disease  - Heart failure  - Heart attack  -  Stroke  - DVT/VTE  - Cardiac arrhythmia  - Respiratory Failure/COPD  - Renal failure  - Anemia  - Advanced Liver disease       Plan: PT/OT WBAT, AROM and PROM  DVT Prophylaxis:  SCDx72hrs, ASA 81 mg BID x 2 weeks DISCHARGE PLAN: Home, today when patient passes physical therapy DISCHARGE NEEDS: HHPT, Walker and 3-in-1 comode seat     [name] 11/09/2018, 6:21 AM Patient ID: Doris Zamora, female   DOB: 29-Nov-1930, 85 y.o.   MRN: [id number]

## 2018-11-09 NOTE — TOC Transition Note (Signed)
Transition of Care [facility]) - CM/SW Discharge Note   Patient Details  Name: Doris Zamora MRN: [id number] Date of Birth: February 13, 1930  [facility] CM/SW Contact:  Lia Hopping, [place] Phone Number: 11/09/2018, 1:07 PM   Clinical Narrative:    CSW discussed HHPT with the patient at bedside and provided a list of [place] options from Medicare.gov Patient insurance requires initial authorization for [place] reached out to [phone] and provided requested information to Rep. [place].  Patient intake ID# [id number]. [facility] will follow up with preferred [facility] agency.  CSW notified the patient at bedside.   -waiting for follow up with [facility]...  Final next level of care: Home w Home Health Services Barriers to Discharge: [place] choice.    Patient Goals and CMS Choice Patient states their goals for this hospitalization and ongoing recovery are:: to get better CMS Medicare.gov Compare Post Acute Care list provided to:: Patient Choice offered to / list presented to : Patient  Discharge Placement                       Discharge Plan and Services                DME Arranged: 3-N-1, Walker rolling DME Agency: [employer] Date DME Agency Contacted: 11/09/18 Time DME Agency Contacted: 0900 Representative spoke with at DME Agency: Ovid Curd HH Arranged: PT [place]: ([facility]-to arrange Home Health therapy: Intake ID# [id number])        Social Determinants of Health (SDOH) Interventions     Readmission Risk Interventions No flowsheet data found.

## 2018-11-10 ENCOUNTER — Telehealth: Payer: Self-pay | Admitting: *Deleted

## 2018-11-10 NOTE — Progress Notes (Signed)
Therapy plan follow up: CSW followed up with patient and daughter. Daughter reports rep. from [facility] made first therapy visit today and will return on Friday.

## 2018-11-10 NOTE — Telephone Encounter (Signed)
Pt was on TCM report admitted 11/08/18 for Degenerative arthritis of left knee. Pt underwent a total left knee replacement. Pt tolerated procedure well and was D/C 11/09/18. Pt will follow=up w/surgeon in 2 weeks.Marland KitchenJohny Chess

## 2018-11-11 ENCOUNTER — Telehealth: Payer: Self-pay | Admitting: *Deleted

## 2018-11-11 NOTE — Telephone Encounter (Signed)
Patient discharged from hospital to home on 11/09/18 after left total knee surgery. Patient's discharge instructions state to f/u with surgeon Dr. Mayer Camel.

## 2018-11-12 ENCOUNTER — Telehealth: Payer: Self-pay | Admitting: Internal Medicine

## 2018-11-12 NOTE — Telephone Encounter (Signed)
Called pt, LVM.   

## 2018-11-12 NOTE — Telephone Encounter (Signed)
Daughter Arna Snipe, calling back with questions/concerns for the rehab cb [phone]

## 2018-11-12 NOTE — Telephone Encounter (Signed)
Copied from [place] [id number]. Topic: General - Inquiry >> Nov 12, 2018  8:57 AM Virl Axe D wrote: Reason for CRM: Pt's daughter Arna Snipe stated pt was set up for PT with [facility] while she was in the hospital. PT for one day and is not able to come back until they receive approval from Dr. Jenny Reichmann. Please reach out to daughter if there are questions. [phone]

## 2018-11-12 NOTE — Telephone Encounter (Signed)
Informed pt's daughter to follow up with the orthopedic specialist who performed the surgery to get additional information and/or approval for PT.  PCP has not seen pt since 04/2018. I offered a hospital follow up to be seen to discuss. Pt's daughter declined and stated that she had already cancelled the follow up the pt had on 10/14 with PCP due to her having a follow up OV with the surgeon on 10/13. She did not want the pt to be put through a lot of movement due to the surgery being recent.

## 2018-11-16 IMAGING — CR DG THORACIC SPINE 2V
3 series · 3 of 3 positions shown · non-contrast
Comparison: None.

CLINICAL DATA: C/o right side thoracic pain s/p fall tonight at
home. Pt states she slipped in epsom salt that was on the floor. Hx
endometrial ca

EXAM:
THORACIC SPINE 2 VIEWS

[t thoracic spine ap]
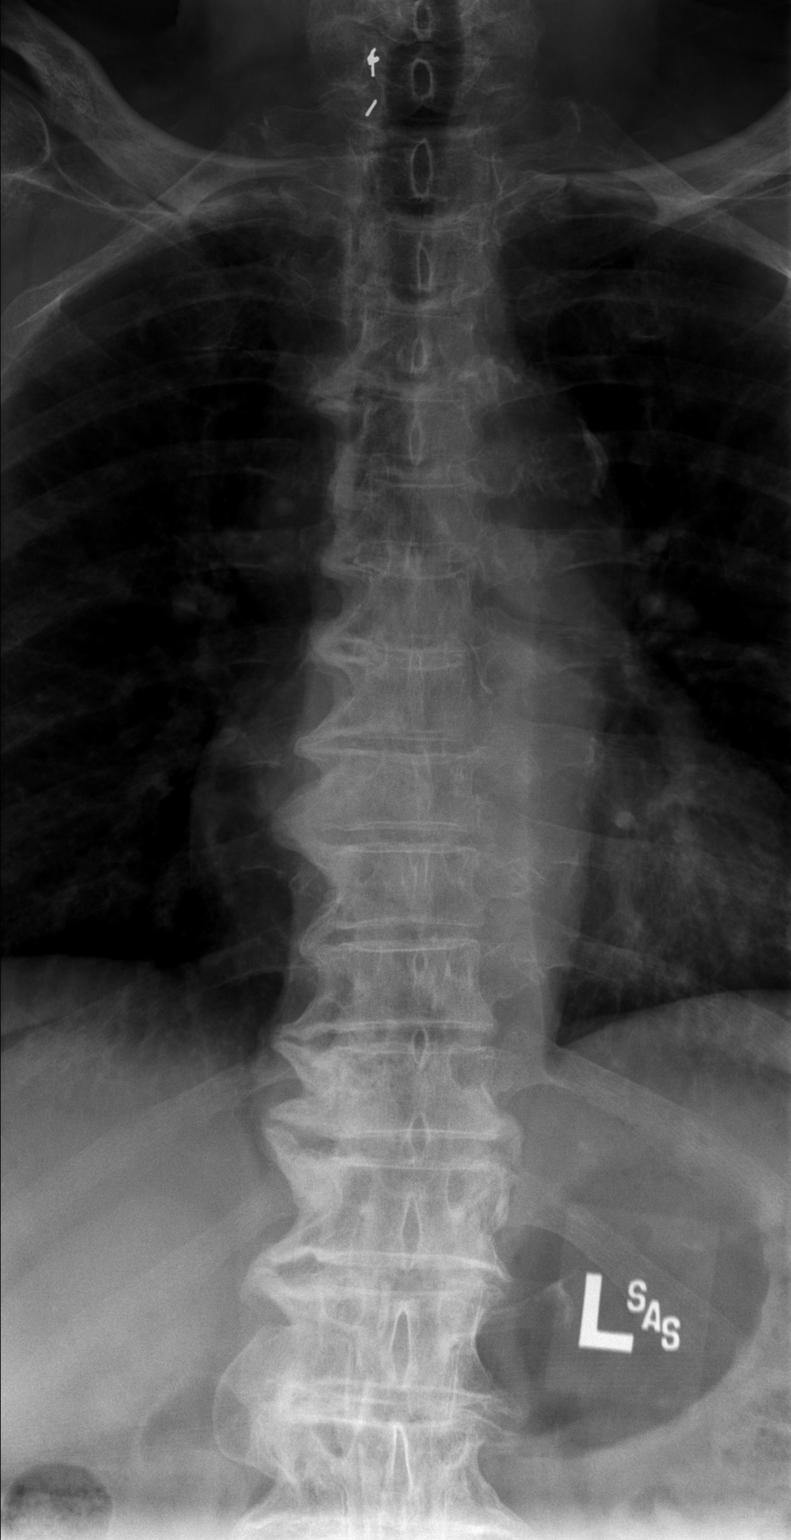

[t thoracic spine lat]
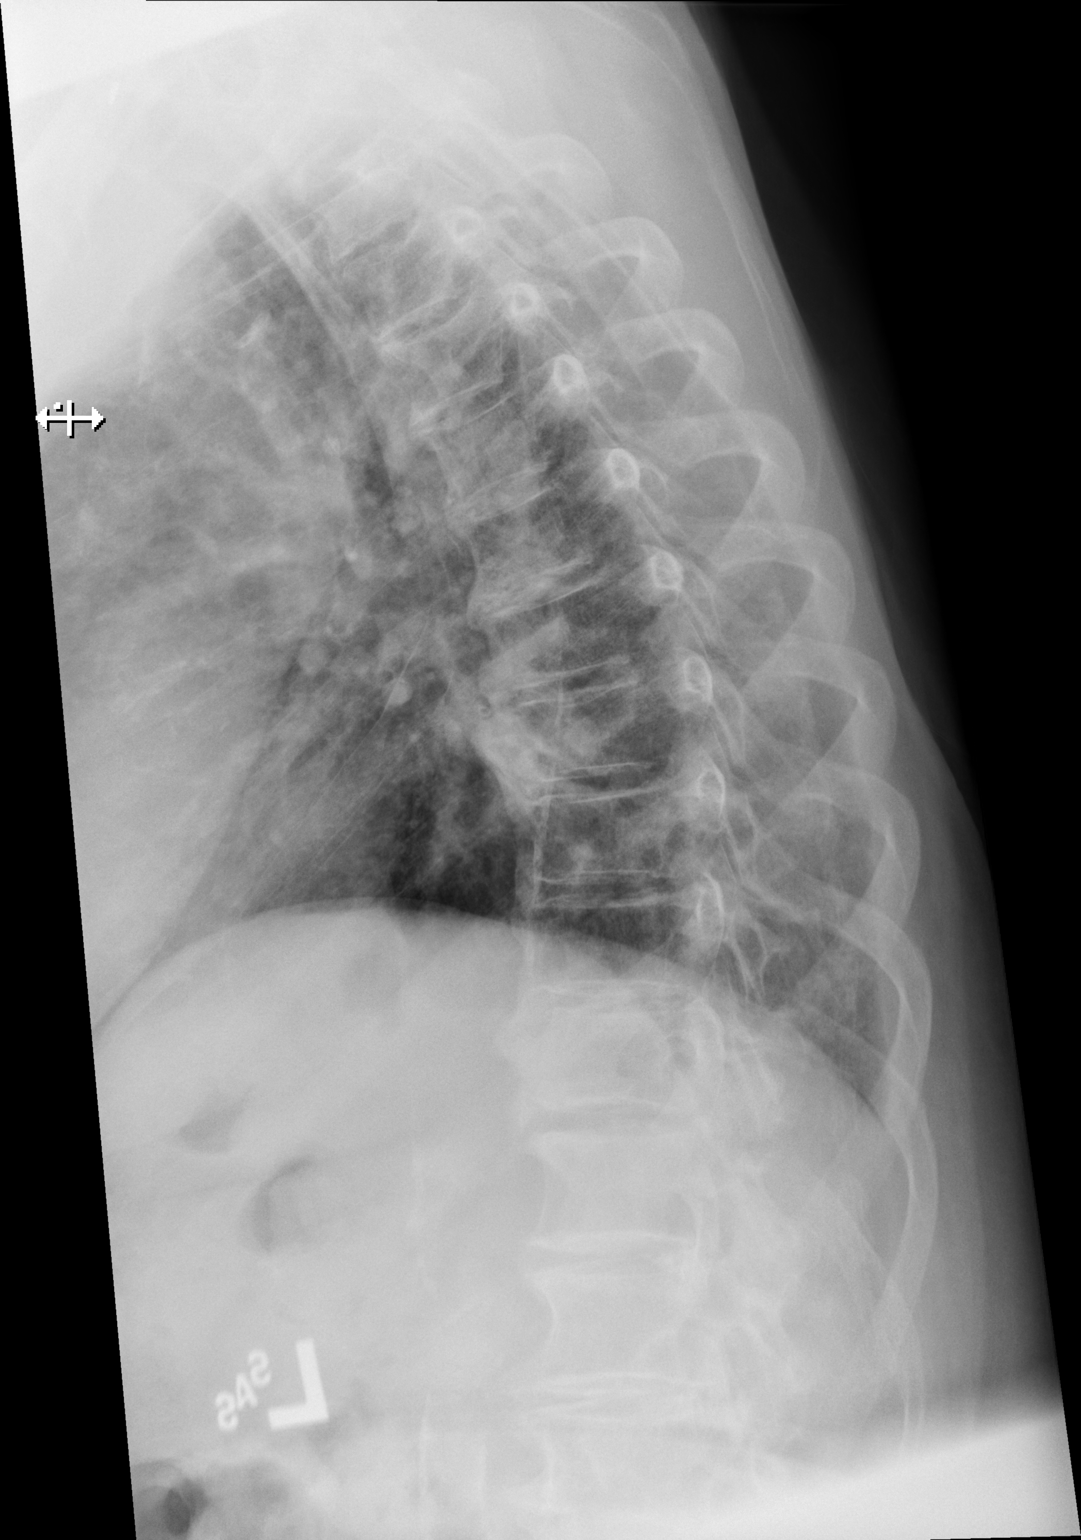

[t thoracic swimmers]
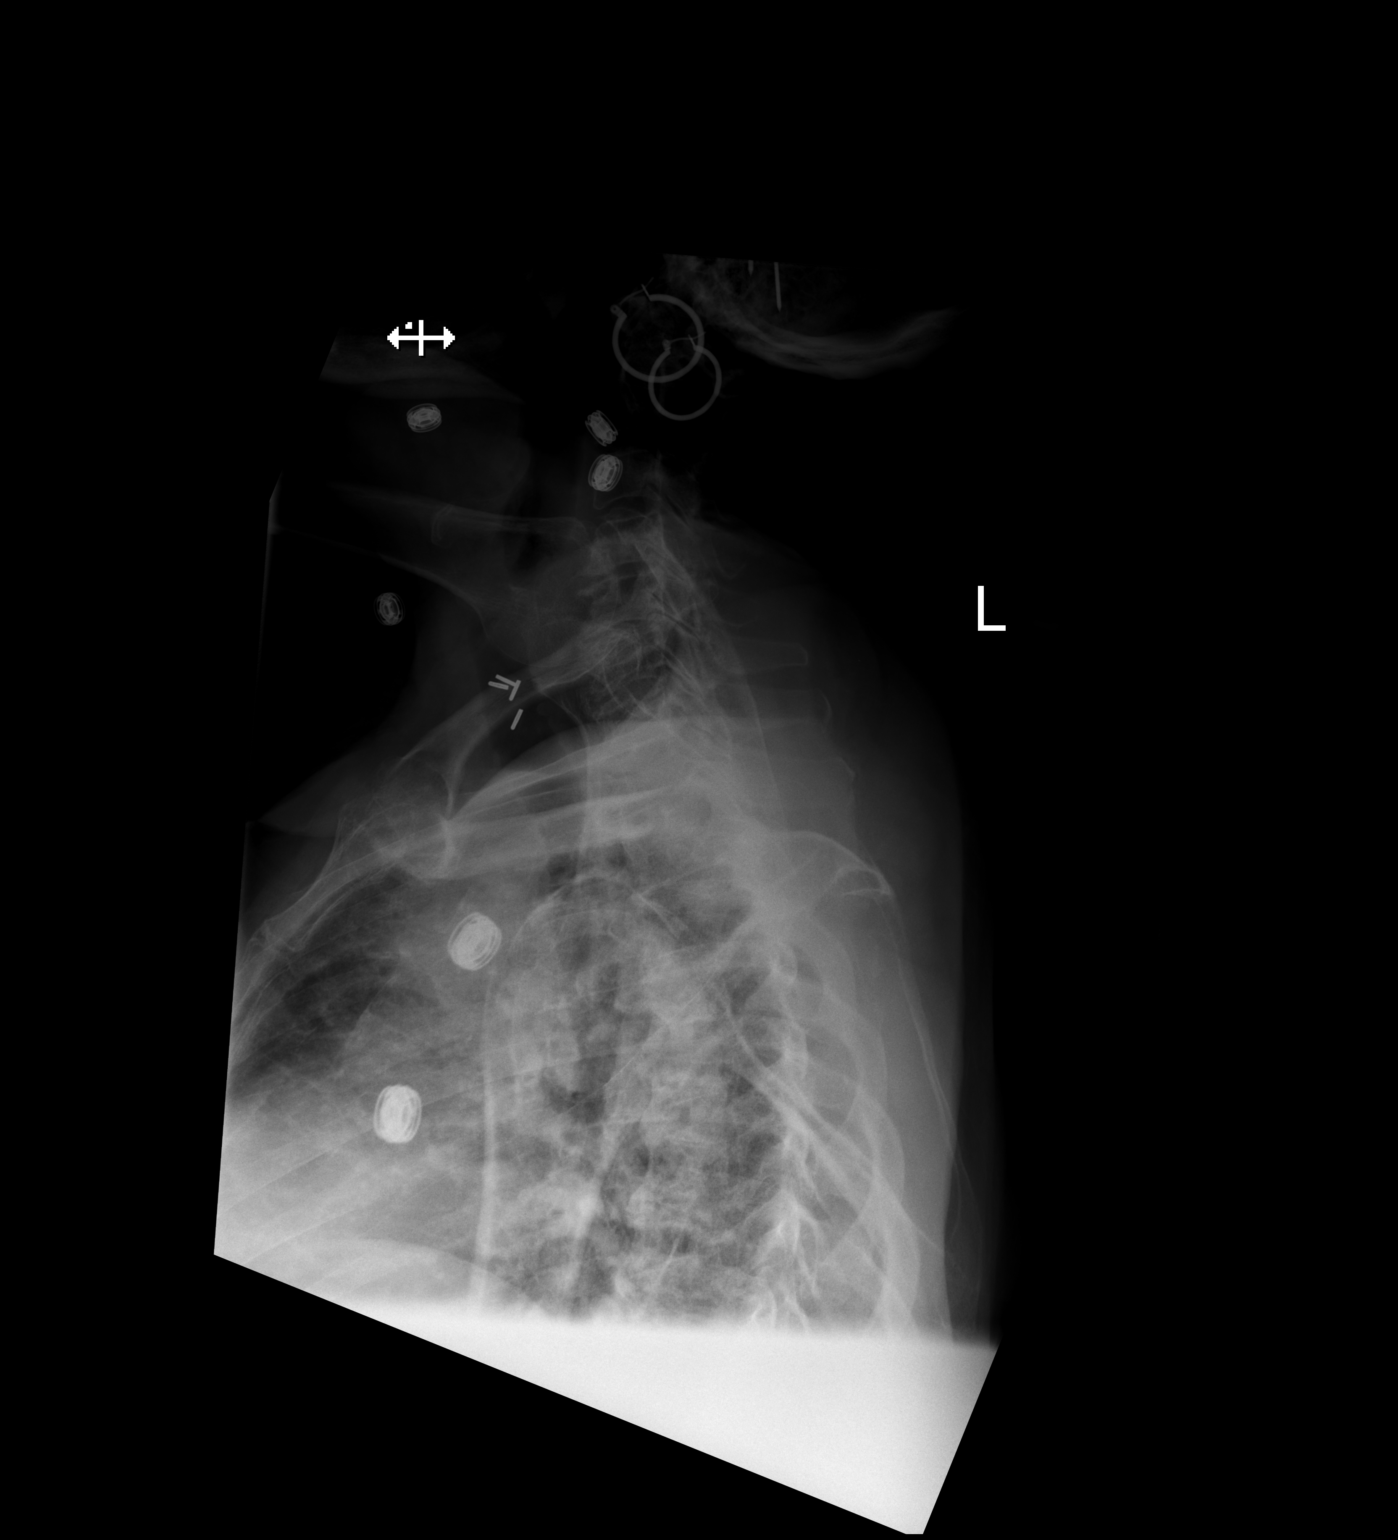

[3 of 3 positions shown; findings below may reference images not displayed]

FINDINGS: No evidence of acute fracture within the thoracic spine. No
vertebral body subluxation. Mild degenerative spurring throughout.

Atherosclerosis of the thoracic aorta. Visualized paravertebral soft
tissues are otherwise unremarkable.
IMPRESSION: No acute findings. No evidence of acute fracture or subluxation
within the thoracic spine.

Aortic atherosclerosis.

Mild degenerative change throughout the thoracic spine.

## 2018-11-24 ENCOUNTER — Encounter: Payer: Medicare Other | Admitting: Internal Medicine

## 2018-12-01 ENCOUNTER — Telehealth: Payer: Self-pay | Admitting: Internal Medicine

## 2018-12-01 MED ORDER — ALPRAZOLAM 0.5 MG PO TABS: ORAL_TABLET | ORAL | 5 refills | Status: DC

## 2018-12-01 NOTE — Telephone Encounter (Signed)
Done erx 

## 2018-12-09 ENCOUNTER — Encounter: Payer: Self-pay | Admitting: Internal Medicine

## 2018-12-09 ENCOUNTER — Other Ambulatory Visit (INDEPENDENT_AMBULATORY_CARE_PROVIDER_SITE_OTHER): Payer: Medicare Other

## 2018-12-09 ENCOUNTER — Other Ambulatory Visit: Payer: Self-pay

## 2018-12-09 ENCOUNTER — Ambulatory Visit (INDEPENDENT_AMBULATORY_CARE_PROVIDER_SITE_OTHER): Payer: Medicare Other | Admitting: Internal Medicine

## 2018-12-09 VITALS — BP 124/84 | HR 77 | Temp 98.5°F | Ht 66.0 in | Wt 196.0 lb

## 2018-12-09 DIAGNOSIS — E611 Iron deficiency: Secondary | ICD-10-CM | POA: Diagnosis not present

## 2018-12-09 DIAGNOSIS — E538 Deficiency of other specified B group vitamins: Secondary | ICD-10-CM

## 2018-12-09 DIAGNOSIS — R739 Hyperglycemia, unspecified: Secondary | ICD-10-CM | POA: Diagnosis not present

## 2018-12-09 DIAGNOSIS — Z0000 Encounter for general adult medical examination without abnormal findings: Secondary | ICD-10-CM

## 2018-12-09 DIAGNOSIS — E559 Vitamin D deficiency, unspecified: Secondary | ICD-10-CM | POA: Diagnosis not present

## 2018-12-09 LAB — CBC WITH DIFFERENTIAL/PLATELET
Basophils Absolute: 0.1 10*3/uL (ref 0.0–0.1)
Basophils Relative: 1.1 % (ref 0.0–3.0)
Eosinophils Absolute: 0.1 10*3/uL (ref 0.0–0.7)
Eosinophils Relative: 1.6 % (ref 0.0–5.0)
HCT: 36.8 % (ref 36.0–46.0)
Hemoglobin: 12.1 g/dL (ref 12.0–15.0)
Lymphocytes Relative: 37.9 % (ref 12.0–46.0)
Lymphs Abs: 2.1 10*3/uL (ref 0.7–4.0)
MCHC: 32.7 g/dL (ref 30.0–36.0)
MCV: 93 fl (ref 78.0–100.0)
Monocytes Absolute: 0.7 10*3/uL (ref 0.1–1.0)
Monocytes Relative: 12.1 % — ABNORMAL HIGH (ref 3.0–12.0)
Neutro Abs: 2.6 10*3/uL (ref 1.4–7.7)
Neutrophils Relative %: 47.3 % (ref 43.0–77.0)
Platelets: 304 10*3/uL (ref 150.0–400.0)
RBC: 3.96 Mil/uL (ref 3.87–5.11)
RDW: 13.9 % (ref 11.5–15.5)
WBC: 5.4 10*3/uL (ref 4.0–10.5)

## 2018-12-09 LAB — BASIC METABOLIC PANEL
BUN: 9 mg/dL (ref 6–23)
CO2: 29 mEq/L (ref 19–32)
Calcium: 9.2 mg/dL (ref 8.4–10.5)
Chloride: 103 mEq/L (ref 96–112)
Creatinine, Ser: 0.65 mg/dL (ref 0.40–1.20)
GFR: 104.45 mL/min (ref >60.00)
Glucose, Bld: 87 mg/dL (ref 70–99)
Potassium: 4.4 mEq/L (ref 3.5–5.1)
Sodium: 136 mEq/L (ref 135–145)

## 2018-12-09 LAB — URINALYSIS, ROUTINE W REFLEX MICROSCOPIC
Bilirubin Urine: NEGATIVE
Hgb urine dipstick: NEGATIVE
Nitrite: NEGATIVE
Specific Gravity, Urine: 1.02 (ref 1.000–1.030)
Total Protein, Urine: NEGATIVE
Urine Glucose: NEGATIVE
Urobilinogen, UA: 0.2 (ref 0.0–1.0)
pH: 6 (ref 5.0–8.0)

## 2018-12-09 LAB — HEMOGLOBIN A1C: Hgb A1c MFr Bld: 5.1 % (ref 4.6–6.5)

## 2018-12-09 LAB — LIPID PANEL
Cholesterol: 186 mg/dL (ref 0–200)
HDL: 59.6 mg/dL (ref >39.00)
LDL Cholesterol: 107 mg/dL — ABNORMAL HIGH (ref 0–99)
NonHDL: 126.31
Total CHOL/HDL Ratio: 3
Triglycerides: 96 mg/dL (ref 0.0–149.0)
VLDL: 19.2 mg/dL (ref 0.0–40.0)

## 2018-12-09 LAB — HEPATIC FUNCTION PANEL
ALT: 23 U/L (ref 0–35)
AST: 34 U/L (ref 0–37)
Albumin: 3.5 g/dL (ref 3.5–5.2)
Alkaline Phosphatase: 99 U/L (ref 39–117)
Bilirubin, Direct: 0.1 mg/dL (ref 0.0–0.3)
Total Bilirubin: 0.5 mg/dL (ref 0.2–1.2)
Total Protein: 8.3 g/dL (ref 6.0–8.3)

## 2018-12-09 LAB — IBC PANEL
Iron: 55 ug/dL (ref 42–145)
Saturation Ratios: 21.7 % (ref 20.0–50.0)
Transferrin: 181 mg/dL — ABNORMAL LOW (ref 212.0–360.0)

## 2018-12-09 MED ORDER — ALPRAZOLAM 0.5 MG PO TABS: ORAL_TABLET | ORAL | 5 refills | Status: DC

## 2018-12-09 NOTE — Patient Instructions (Addendum)

## 2018-12-09 NOTE — Assessment & Plan Note (Signed)
stable overall by history and exam, recent data reviewed with pt, and pt to continue medical treatment as before,  to f/u any worsening symptoms or concerns  

## 2018-12-09 NOTE — Progress Notes (Signed)
Subjective:    Patient ID: Doris Zamora, female    DOB: 1930/03/09, 85 y.o.   MRN: [id number]  HPI  Here for wellness and f/u;  Overall doing ok;  Pt denies Chest pain, worsening SOB, DOE, wheezing, orthopnea, PND, worsening LE edema, palpitations, dizziness or syncope.  Pt denies neurological change such as new headache, facial or extremity weakness.  Pt denies polydipsia, polyuria, or low sugar symptoms. Pt states overall good compliance with treatment and medications, good tolerability, and has been trying to follow appropriate diet.  Pt denies worsening depressive symptoms, suicidal ideation or panic. No fever, night sweats, wt loss, loss of appetite, or other constitutional symptoms.  Pt states good ability with ADL's, has low fall risk, home safety reviewed and adequate, no other significant changes in hearing or vision, and not active with exercise.  S/p left knee TKR late sept 2020 per Dr Mayer Camel, doing well with walker, may transition to the cane soon, in fact using the cane for up and down stairs. Just had PT yesterday.  Past Medical History:  Diagnosis Date  . ANXIETY   . Arthritis   . Endometrial cancer ([place]) 2007  . Hearing loss   . Heartburn    occ  . Hyperglycemia 04/20/2014  . HYPERLIPIDEMIA   . Hypothyroidism   . INSOMNIA-SLEEP DISORDER-UNSPEC   . Peripheral edema   . Seasonal allergies   . Venous insufficiency   . WEIGHT LOSS    Past Surgical History:  Procedure Laterality Date  . ABDOMINAL HYSTERECTOMY  2007  . BREAST BIOPSY Left 01/26/2014   x3, malignant  . CATARACT EXTRACTION, BILATERAL    . OOPHORECTOMY  2007  . THYROIDECTOMY, PARTIAL  1983  . TOTAL KNEE ARTHROPLASTY Left 11/08/2018   Procedure: LEFT TOTAL KNEE ARTHROPLASTY;  Surgeon: Frederik Pear, MD;  Location: [facility];  Service: Orthopedics;  Laterality: Left;    reports that she quit smoking about 24 years ago. Her smoking use included cigarettes. She quit after 50.00 years of use. She has never used  smokeless tobacco. She reports current alcohol use of about 7.0 standard drinks of alcohol per week. She reports that she does not use drugs. family history includes Cancer (age of onset: 42) in an other family member; Cancer (age of onset: 60) in her sister; Diabetes in her mother; Muscular dystrophy in her father. Allergies  Allergen Reactions  . Synthroid [Levothyroxine Sodium] Swelling    Happened yrs ago  . Pneumovax [Pneumococcal Polysaccharide Vaccine] Swelling   Current Outpatient Medications on File Prior to Visit  Medication Sig Dispense Refill  . anastrozole (ARIMIDEX) 1 MG tablet TAKE 1 TABLET(1 MG) BY MOUTH DAILY (Patient taking differently: Take 1 mg by mouth daily. ) 30 tablet 3  . aspirin EC 81 MG tablet Take 1 tablet (81 mg total) by mouth 2 (two) times daily. 60 tablet 0  . BLACK CURRANT SEED OIL PO Take 1 Dose by mouth daily.    . cetirizine (ZYRTEC) 10 MG tablet TAKE 1 TABLET(10 MG) BY MOUTH DAILY (Patient taking differently: Take 10 mg by mouth daily. ) 30 tablet 5  . cholecalciferol (VITAMIN D) 1000 UNITS tablet Take 1,000 Units by mouth daily.     . Coenzyme Q10 (COQ10) 100 MG CAPS Take 100 mg by mouth daily.    . fluticasone (FLONASE) 50 MCG/ACT nasal spray Place 2 sprays into both nostrils daily. 16 g 2  . furosemide (LASIX) 20 MG tablet TAKE 1/2 TO 1 TABLET BY MOUTH DAILY  AS NEEDED FOR SWELLING (Patient taking differently: Take 10-20 mg by mouth daily as needed for edema. ) 30 tablet 5  . Krill Oil 350 MG CAPS Take 350 mg by mouth daily.    . Menthol, Topical Analgesic, (BIOFREEZE EX) Apply 1 patch topically daily as needed (pain).    . Naphazoline HCl (CLEAR EYES OP) Place 1 drop into both eyes daily as needed (dry eyes).    Marland Kitchen OVER THE COUNTER MEDICATION Take 1 tablet by mouth daily. Instaflex otc supplement    . oxyCODONE-acetaminophen (PERCOCET/ROXICET) 5-325 MG tablet Take 1 tablet by mouth every 4 (four) hours as needed for severe pain. 30 tablet 0  . potassium  chloride (K-DUR) 10 MEQ tablet TAKE 1 TABLET BY MOUTH EVERY DAY AS NEEDED WITH USE OF LASIX (Patient taking differently: Take 10 mEq by mouth daily as needed (when taking lasix). ) 30 tablet 5  . Selenium 200 MCG CAPS Take 200 mcg by mouth daily.    . Thiamine HCl (VITAMIN B-1) 250 MG tablet Take 250 mg by mouth daily.    Marland Kitchen tiZANidine (ZANAFLEX) 2 MG tablet Take 1 tablet (2 mg total) by mouth every 6 (six) hours as needed. 60 tablet 0  . vitamin B-12 (CYANOCOBALAMIN) 1000 MCG tablet Take 1,000 mcg by mouth daily.    . vitamin E 400 UNIT capsule Take 400 Units by mouth daily.     No current facility-administered medications on file prior to visit.    Review of Systems  Constitutional: Negative for other unusual diaphoresis or sweats HENT: Negative for ear discharge or swelling Eyes: Negative for other worsening visual disturbances Respiratory: Negative for stridor or other swelling  Gastrointestinal: Negative for worsening distension or other blood Genitourinary: Negative for retention or other urinary change Musculoskeletal: Negative for other MSK pain or swelling Skin: Negative for color change or other new lesions Neurological: Negative for worsening tremors and other numbness  Psychiatric/Behavioral: Negative for worsening agitation or other fatigue All otherwise neg per pt     Objective:   Physical Exam BP 124/84   Pulse 77   Temp 98.5 F (36.9 C) (Oral)   Ht 5\' 6"  (1.676 m)   Wt 196 lb (88.9 kg)   SpO2 96%   BMI 31.64 kg/m  VS noted,  Constitutional: Pt appears in NAD HENT: Head: NCAT.  Right Ear: External ear normal.  Left Ear: External ear normal.  Eyes: . Pupils are equal, round, and reactive to light. Conjunctivae and EOM are normal Nose: without d/c or deformity Neck: Neck supple. Gross normal ROM Cardiovascular: Normal rate and regular rhythm.   Pulmonary/Chest: Effort normal and breath sounds without rales or wheezing.  Abd:  Soft, NT, ND, + BS, no organomegaly  Neurological: Pt is alert. At baseline orientation, motor grossly intact Skin: Skin is warm. No rashes, other new lesions, no LE edema Psychiatric: Pt behavior is normal without agitation  All otherwise neg per pt  Lab Results  Component Value Date   WBC 9.0 11/09/2018   HGB 10.8 (L) 11/09/2018   HCT 34.6 (L) 11/09/2018   PLT 197 11/09/2018   GLUCOSE 130 (H) 11/09/2018   CHOL 195 11/18/2017   TRIG 63.0 11/18/2017   HDL 74.40 11/18/2017   LDLDIRECT 129.4 02/14/2008   LDLCALC 108 (H) 11/18/2017   ALT 26 01/26/2018   AST 34 01/26/2018   NA 133 (L) 11/09/2018   K 4.2 11/09/2018   CL 103 11/09/2018   CREATININE 0.59 11/09/2018   BUN 13  11/09/2018   CO2 23 11/09/2018   TSH 1.54 11/18/2017   INR 1.0 11/03/2018   HGBA1C 5.4 11/18/2017       Assessment & Plan:

## 2018-12-09 NOTE — Assessment & Plan Note (Signed)

## 2018-12-10 LAB — TSH: TSH: 1.47 u[IU]/mL (ref 0.35–4.50)

## 2018-12-10 LAB — VITAMIN D 25 HYDROXY (VIT D DEFICIENCY, FRACTURES): VITD: 24.38 ng/mL — ABNORMAL LOW (ref 30.00–100.00)

## 2018-12-10 LAB — VITAMIN B12: Vitamin B-12: 1053 pg/mL — ABNORMAL HIGH (ref 211–911)

## 2018-12-11 ENCOUNTER — Encounter: Payer: Self-pay | Admitting: Internal Medicine

## 2018-12-11 ENCOUNTER — Other Ambulatory Visit: Payer: Self-pay | Admitting: Internal Medicine

## 2018-12-11 MED ORDER — VITAMIN D (ERGOCALCIFEROL) 1.25 MG (50000 UNIT) PO CAPS: 50000.0000 [IU] | ORAL_CAPSULE | ORAL | 0 refills | Status: DC

## 2018-12-19 ENCOUNTER — Other Ambulatory Visit: Payer: Self-pay | Admitting: Hematology

## 2018-12-19 DIAGNOSIS — C50912 Malignant neoplasm of unspecified site of left female breast: Secondary | ICD-10-CM

## 2018-12-19 DIAGNOSIS — Z170 Estrogen receptor positive status [ER+]: Secondary | ICD-10-CM

## 2018-12-21 ENCOUNTER — Telehealth: Payer: Self-pay | Admitting: Internal Medicine

## 2018-12-21 NOTE — Telephone Encounter (Signed)
ALPRAZolam (XANAX) 0.5 MG tablet   Patient requesting refill. She was advised by pharmacy to contact PCP.

## 2018-12-22 NOTE — Telephone Encounter (Signed)
No need and too soon for xanax as was done oct 29 x 6 mo

## 2019-02-03 ENCOUNTER — Encounter: Payer: Self-pay | Admitting: Internal Medicine

## 2019-02-03 ENCOUNTER — Ambulatory Visit (INDEPENDENT_AMBULATORY_CARE_PROVIDER_SITE_OTHER): Payer: Medicare Other | Admitting: Internal Medicine

## 2019-02-03 ENCOUNTER — Other Ambulatory Visit: Payer: Self-pay

## 2019-02-03 VITALS — BP 122/74 | HR 66 | Temp 98.1°F | Resp 12 | Ht 65.25 in | Wt 192.0 lb

## 2019-02-03 DIAGNOSIS — F411 Generalized anxiety disorder: Secondary | ICD-10-CM

## 2019-02-03 DIAGNOSIS — E785 Hyperlipidemia, unspecified: Secondary | ICD-10-CM | POA: Diagnosis not present

## 2019-02-03 DIAGNOSIS — R739 Hyperglycemia, unspecified: Secondary | ICD-10-CM | POA: Diagnosis not present

## 2019-02-03 NOTE — Progress Notes (Signed)
Subjective:    Patient ID: Doris Zamora, female    DOB: [birth year], 85 y.o.   MRN: [id number]  HPI  Here to f/u; overall doing ok,  Pt denies chest pain, increasing sob or doe, wheezing, orthopnea, PND, increased LE swelling, palpitations, dizziness or syncope.  Pt denies new neurological symptoms such as new headache, or facial or extremity weakness or numbness.  Pt denies polydipsia, polyuria, or low sugar episode.  Pt states overall good compliance with meds, mostly trying to follow appropriate diet, with wt overall stable,  but little exercise however.  Denies worsening depressive symptoms, suicidal ideation, or panic; has ongoing anxiety Past Medical History:  Diagnosis Date  . ANXIETY   . Arthritis   . Endometrial cancer ([place]) 2007  . Hearing loss   . Heartburn    occ  . Hyperglycemia 04/20/2014  . HYPERLIPIDEMIA   . Hypothyroidism   . INSOMNIA-SLEEP DISORDER-UNSPEC   . Peripheral edema   . Seasonal allergies   . Venous insufficiency   . WEIGHT LOSS    Past Surgical History:  Procedure Laterality Date  . ABDOMINAL HYSTERECTOMY  2007  . BREAST BIOPSY Left 01/26/2014   x3, malignant  . CATARACT EXTRACTION, BILATERAL    . OOPHORECTOMY  2007  . THYROIDECTOMY, PARTIAL  1983  . TOTAL KNEE ARTHROPLASTY Left 11/08/2018   Procedure: LEFT TOTAL KNEE ARTHROPLASTY;  Surgeon: Frederik Pear, MD;  Location: [facility];  Service: Orthopedics;  Laterality: Left;    reports that she quit smoking about 25 years ago. Her smoking use included cigarettes. She quit after 50.00 years of use. She has never used smokeless tobacco. She reports current alcohol use of about 7.0 standard drinks of alcohol per week. She reports that she does not use drugs. family history includes Cancer (age of onset: 102) in an other family member; Cancer (age of onset: 74) in her sister; Diabetes in her mother; Muscular dystrophy in her father. Allergies  Allergen Reactions  . Synthroid [Levothyroxine Sodium] Swelling     Happened yrs ago  . Pneumovax [Pneumococcal Polysaccharide Vaccine] Swelling   Current Outpatient Medications on File Prior to Visit  Medication Sig Dispense Refill  . ALPRAZolam (XANAX) 0.5 MG tablet TAKE 1 TABLET(0.5 MG) BY MOUTH DAILY AS NEEDED 30 tablet 5  . anastrozole (ARIMIDEX) 1 MG tablet TAKE 1 TABLET(1 MG) BY MOUTH DAILY 30 tablet 3  . aspirin EC 81 MG tablet Take 1 tablet (81 mg total) by mouth 2 (two) times daily. 60 tablet 0  . cetirizine (ZYRTEC) 10 MG tablet TAKE 1 TABLET(10 MG) BY MOUTH DAILY (Patient taking differently: Take 10 mg by mouth daily. ) 30 tablet 5  . cholecalciferol (VITAMIN D) 1000 UNITS tablet Take 1,000 Units by mouth daily.     . Coenzyme Q10 (COQ10) 100 MG CAPS Take 100 mg by mouth daily.    . fluticasone (FLONASE) 50 MCG/ACT nasal spray Place 2 sprays into both nostrils daily. 16 g 2  . furosemide (LASIX) 20 MG tablet TAKE 1/2 TO 1 TABLET BY MOUTH DAILY AS NEEDED FOR SWELLING (Patient taking differently: Take 10-20 mg by mouth daily as needed for edema. ) 30 tablet 5  . Krill Oil 350 MG CAPS Take 350 mg by mouth daily.    . Menthol, Topical Analgesic, (BIOFREEZE EX) Apply 1 patch topically daily as needed (pain).    . Naphazoline HCl (CLEAR EYES OP) Place 1 drop into both eyes daily as needed (dry eyes).    Marland Kitchen OVER  THE COUNTER MEDICATION Take 1 tablet by mouth daily. Instaflex otc supplement    . potassium chloride (K-DUR) 10 MEQ tablet TAKE 1 TABLET BY MOUTH EVERY DAY AS NEEDED WITH USE OF LASIX (Patient taking differently: Take 10 mEq by mouth daily as needed (when taking lasix). ) 30 tablet 5  . Thiamine HCl (VITAMIN B-1) 250 MG tablet Take 250 mg by mouth daily.    . vitamin B-12 (CYANOCOBALAMIN) 1000 MCG tablet Take 1,000 mcg by mouth daily.    . Vitamin D, Ergocalciferol, (DRISDOL) 1.25 MG (50000 UT) CAPS capsule Take 1 capsule (50,000 Units total) by mouth every 7 (seven) days. 12 capsule 0  . vitamin E 400 UNIT capsule Take 400 Units by mouth daily.     Marland Kitchen BLACK CURRANT SEED OIL PO Take 1 Dose by mouth daily.    Marland Kitchen oxyCODONE-acetaminophen (PERCOCET/ROXICET) 5-325 MG tablet Take 1 tablet by mouth every 4 (four) hours as needed for severe pain. (Patient not taking: Reported on 02/03/2019) 30 tablet 0  . Selenium 200 MCG CAPS Take 200 mcg by mouth daily.    Marland Kitchen tiZANidine (ZANAFLEX) 2 MG tablet Take 1 tablet (2 mg total) by mouth every 6 (six) hours as needed. (Patient not taking: Reported on 02/03/2019) 60 tablet 0   No current facility-administered medications on file prior to visit.   Review of Systems  Constitutional: Negative for other unusual diaphoresis or sweats HENT: Negative for ear discharge or swelling Eyes: Negative for other worsening visual disturbances Respiratory: Negative for stridor or other swelling  Gastrointestinal: Negative for worsening distension or other blood Genitourinary: Negative for retention or other urinary change Musculoskeletal: Negative for other MSK pain or swelling Skin: Negative for color change or other new lesions Neurological: Negative for worsening tremors and other numbness  Psychiatric/Behavioral: Negative for worsening agitation or other fatigue All otherwise neg per pt     Objective:   Physical Exam BP 122/74 (BP Location: Left Arm)   Pulse 66   Temp 98.1 F (36.7 C) (Oral)   Resp 12   Ht 5' 5.25" (1.657 m)   Wt 192 lb (87.1 kg)   SpO2 99%   BMI 31.71 kg/m  VS noted,  Constitutional: Pt appears in NAD HENT: Head: NCAT.  Right Ear: External ear normal.  Left Ear: External ear normal.  Eyes: . Pupils are equal, round, and reactive to light. Conjunctivae and EOM are normal Nose: without d/c or deformity Neck: Neck supple. Gross normal ROM Cardiovascular: Normal rate and regular rhythm.   Pulmonary/Chest: Effort normal and breath sounds without rales or wheezing.  Abd:  Soft, NT, ND, + BS, no organomegaly Neurological: Pt is alert. At baseline orientation, motor grossly intact Skin:  Skin is warm. No rashes, other new lesions, no LE edema Psychiatric: Pt behavior is normal without agitation  All otherwise neg per pt  Lab Results  Component Value Date   WBC 5.4 12/09/2018   HGB 12.1 12/09/2018   HCT 36.8 12/09/2018   PLT 304.0 12/09/2018   GLUCOSE 87 12/09/2018   CHOL 186 12/09/2018   TRIG 96.0 12/09/2018   HDL 59.60 12/09/2018   LDLDIRECT 129.4 02/14/2008   LDLCALC 107 (H) 12/09/2018   ALT 23 12/09/2018   AST 34 12/09/2018   NA 136 12/09/2018   K 4.4 12/09/2018   CL 103 12/09/2018   CREATININE 0.65 12/09/2018   BUN 9 12/09/2018   CO2 29 12/09/2018   TSH 1.47 12/09/2018   INR 1.0 11/03/2018   HGBA1C  5.1 12/09/2018        Assessment & Plan:

## 2019-02-06 ENCOUNTER — Encounter: Payer: Self-pay | Admitting: Internal Medicine

## 2019-02-06 NOTE — Patient Instructions (Signed)
Please continue all other medications as before, and refills have been done if requested.  Please have the pharmacy call with any other refills you may need.  Please continue your efforts at being more active, low cholesterol diet, and weight control.  Please keep your appointments with your specialists as you may have planned  Please return in 6 months, or sooner if needed 

## 2019-02-06 NOTE — Assessment & Plan Note (Signed)
stable overall by history and exam, recent data reviewed with pt, and pt to continue medical treatment as before,  to f/u any worsening symptoms or concerns  

## 2019-04-15 ENCOUNTER — Ambulatory Visit: Payer: Self-pay

## 2019-04-15 ENCOUNTER — Ambulatory Visit: Payer: Medicare Other | Attending: Internal Medicine

## 2019-04-15 DIAGNOSIS — Z23 Encounter for immunization: Secondary | ICD-10-CM | POA: Insufficient documentation

## 2019-04-15 NOTE — Progress Notes (Signed)
   Covid-19 Vaccination Clinic  Name:  Doris Zamora    MRN: [id number] DOB: 1930/09/19  04/15/2019  [name] was observed post Covid-19 immunization for 15 minutes without incident. She was provided with Vaccine Information Sheet and instruction to access the V-Safe system.   [name] was instructed to call 911 with any severe reactions post vaccine: Marland Kitchen Difficulty breathing  . Swelling of face and throat  . A fast heartbeat  . A bad rash all over body  . Dizziness and weakness   Immunizations Administered    Name Date Dose VIS Date Route   Pfizer COVID-19 Vaccine 04/15/2019 11:09 AM 0.3 mL 01/21/2019 Intramuscular   Manufacturer: [place]   Lot: [id number]   [place]: [id number]

## 2019-04-29 ENCOUNTER — Other Ambulatory Visit: Payer: Self-pay | Admitting: Hematology

## 2019-04-29 DIAGNOSIS — C50912 Malignant neoplasm of unspecified site of left female breast: Secondary | ICD-10-CM

## 2019-04-29 DIAGNOSIS — Z170 Estrogen receptor positive status [ER+]: Secondary | ICD-10-CM

## 2019-05-02 NOTE — Telephone Encounter (Signed)
Refill request

## 2019-05-11 ENCOUNTER — Ambulatory Visit: Payer: BC Managed Care – PPO | Attending: Internal Medicine

## 2019-05-11 DIAGNOSIS — Z23 Encounter for immunization: Secondary | ICD-10-CM

## 2019-05-11 NOTE — Progress Notes (Signed)
   Covid-19 Vaccination Clinic  Name:  Doris Zamora    MRN: [id number] DOB: Jul 10, 1930  05/11/2019  [name] was observed post Covid-19 immunization for 15 minutes without incident. She was provided with Vaccine Information Sheet and instruction to access the V-Safe system.   [name] was instructed to call 911 with any severe reactions post vaccine: Marland Kitchen Difficulty breathing  . Swelling of face and throat  . A fast heartbeat  . A bad rash all over body  . Dizziness and weakness   Immunizations Administered    Name Date Dose VIS Date Route   Pfizer COVID-19 Vaccine 05/11/2019 11:18 AM 0.3 mL 01/21/2019 Intramuscular   Manufacturer: [place]   Lot: [id number]   [place]: [id number]

## 2019-05-18 NOTE — Progress Notes (Signed)
[place]   [phone] Fax:([phone]) [phone]   Clinic Follow up Note   Patient Care Team: Biagio Borg, MD as PCP - General (Internal Medicine) [name] Guys, MD as Attending Physician (Ophthalmology) Jovita Kussmaul, MD as Consulting Physician (General Surgery) Truitt Merle, MD as Consulting Physician (Hematology)  Date of Service:  05/20/2019  CHIEF COMPLAINT: Follow-up of left breast cancer  SUMMARY OF ONCOLOGIC HISTORY: Oncology History Overview Note  Breast cancer, left breast   Staging form: Breast, AJCC 7th Edition     Clinical: T1c, N0 - Unsigned Endometrial cancer   Staging form: Corpus Uteri - Adenosarcoma, AJCC 7th Edition     Clinical: T1c, N0 - Unsigned     Endometrial cancer ([place])  08/12/2005 Initial Diagnosis   Endometrial cancer, T1bN0, s/p hysrectomy    Breast cancer, left breast ([place])  01/13/2014 Imaging   mammogram and US showed 3 masses at 6 o'clock, retroareolar and 2 o'clock area, measuring 0.7-1.7cm.    01/26/2014 Initial Diagnosis   Breast cancer, left breast, multifocal (3 lesions, biopsy showed 2 similar lesions with lobular features, and the third lesion has ductal features).    02/10/2014 -  Anti-estrogen oral therapy   Pt declined surgery, started anastrozole 1 mg once daily started on 02/10/2014. Pt held anastrzole in 08/2016 and restarted 01/05/17.    09/19/2014 Mammogram    mammogram and ultrasound showed interval decrease in the size and density of the 3 previous biopsy sites of malignancy in the left breast.   08/18/2016 Mammogram   IMPRESSION: Unchanged appearance of the left breast with 3 biopsy proven cancers. No evidence of malignancy in the right breast.   08/18/2016 Imaging   Bone Density 08/18/16 T score -1.4, indicating patient is osteopenic   02/16/2017 Mammogram   Diagnostic bilateral mammogram with tomography: IMPRESSION: No significant interval change in the appearance of the 3 sites of biopsy proven cancer in the  left breast. No evidence of right breast malignancy.   04/02/2017 Imaging   Breast MRI  W WO Contrast 04/02/17 IMPRESSION: 1. 2 cm area of very mild non masslike enhancement within the RETROAREOLAR LEFT breast with LEFT nipple retraction compatible with known malignancy in the RETROAREOLAR region. It is difficult to determine interval change from prior studies given different modalities. 2. Biopsy clips within the UPPER-OUTER LEFT breast and LOWER LEFT breast without adjacent abnormal enhancement. 3. No MR evidence of RIGHT breast malignancy.   08/10/2017 Mammogram   08/10/2017 MM Diag Breast TOMO Bilateral IMPRESSION: 1. No significant interval change in the mammographic appearance of 3 sites of biopsy proven left breast cancer. 2. No mammographic evidence of malignancy on the right.      CURRENT THERAPY:  Anastrozole 1 mg once daily, started on 02/10/2014. Pt held anastrozole in 08/2016 and restarted 01/05/17.   INTERVAL HISTORY:  Doris Zamora is here for a follow up of left breast cancer. She was last seen by me in 08/2017. She presents to the clinic with her daughter. She notes she is doing well. She has been taking anastrozole consistently. She notes she had a wellness check with her PCP office yesterday. She notes she had left knee surgery since her last visit and able to ambulate well and no longer with cane. I reviewed her medication list. She is on Vit D, potassium. She is not on lasix or pain medication. She denies any pain, or chest or abdominal issues. She notes she is not interested in surgery still as she  feels well and her breast mass is not causing her pain.     REVIEW OF SYSTEMS:   Constitutional: Denies fevers, chills or abnormal weight loss Eyes: Denies blurriness of vision Ears, nose, mouth, throat, and face: Denies mucositis or sore throat Respiratory: Denies cough, dyspnea or wheezes Cardiovascular: Denies palpitation, chest discomfort or lower extremity swelling  Gastrointestinal:  Denies nausea, heartburn or change in bowel habits Skin: Denies abnormal skin rashes Lymphatics: Denies new lymphadenopathy or easy bruising Neurological:Denies numbness, tingling or new weaknesses Behavioral/Psych: Mood is stable, no new changes  All other systems were reviewed with the patient and are negative.  MEDICAL HISTORY:  Past Medical History:  Diagnosis Date  . ANXIETY   . Arthritis   . Endometrial cancer ([place]) 2007  . Hearing loss   . Heartburn    occ  . Hyperglycemia 04/20/2014  . HYPERLIPIDEMIA   . Hypothyroidism   . INSOMNIA-SLEEP DISORDER-UNSPEC   . Peripheral edema   . Seasonal allergies   . Venous insufficiency   . WEIGHT LOSS     SURGICAL HISTORY: Past Surgical History:  Procedure Laterality Date  . ABDOMINAL HYSTERECTOMY  2007  . BREAST BIOPSY Left 01/26/2014   x3, malignant  . CATARACT EXTRACTION, BILATERAL    . OOPHORECTOMY  2007  . THYROIDECTOMY, PARTIAL  1983  . TOTAL KNEE ARTHROPLASTY Left 11/08/2018   Procedure: LEFT TOTAL KNEE ARTHROPLASTY;  Surgeon: Frederik Pear, MD;  Location: [facility];  Service: Orthopedics;  Laterality: Left;    I have reviewed the social history and family history with the patient and they are unchanged from previous note.  ALLERGIES:  is allergic to synthroid [levothyroxine sodium] and pneumovax [pneumococcal polysaccharide vaccine].  MEDICATIONS:  Current Outpatient Medications  Medication Sig Dispense Refill  . ALPRAZolam (XANAX) 0.5 MG tablet TAKE 1 TABLET(0.5 MG) BY MOUTH DAILY AS NEEDED 30 tablet 5  . anastrozole (ARIMIDEX) 1 MG tablet TAKE 1 TABLET(1 MG) BY MOUTH DAILY 90 tablet 1  . BLACK CURRANT SEED OIL PO Take 1 Dose by mouth daily.    . cetirizine (ZYRTEC) 10 MG tablet TAKE 1 TABLET(10 MG) BY MOUTH DAILY (Patient taking differently: Take 10 mg by mouth daily. ) 30 tablet 5  . cholecalciferol (VITAMIN D) 1000 UNITS tablet Take 1,000 Units by mouth daily.     . Coenzyme Q10 (COQ10) 100 MG  CAPS Take 100 mg by mouth daily.    . fluticasone (FLONASE) 50 MCG/ACT nasal spray Place 2 sprays into both nostrils daily. 16 g 2  . Krill Oil 350 MG CAPS Take 350 mg by mouth daily.    . Menthol, Topical Analgesic, (BIOFREEZE EX) Apply 1 patch topically daily as needed (pain).    . Naphazoline HCl (CLEAR EYES OP) Place 1 drop into both eyes daily as needed (dry eyes).    Marland Kitchen OVER THE COUNTER MEDICATION Take 1 tablet by mouth daily. Instaflex otc supplement    . potassium chloride (K-DUR) 10 MEQ tablet TAKE 1 TABLET BY MOUTH EVERY DAY AS NEEDED WITH USE OF LASIX (Patient taking differently: Take 10 mEq by mouth daily as needed (when taking lasix). ) 30 tablet 5  . Selenium 200 MCG CAPS Take 200 mcg by mouth daily.    . Thiamine HCl (VITAMIN B-1) 250 MG tablet Take 250 mg by mouth daily.    Marland Kitchen tiZANidine (ZANAFLEX) 2 MG tablet Take 1 tablet (2 mg total) by mouth every 6 (six) hours as needed. (Patient not taking: Reported on 02/03/2019) 60 tablet  0  . vitamin B-12 (CYANOCOBALAMIN) 1000 MCG tablet Take 1,000 mcg by mouth daily.    . Vitamin D, Ergocalciferol, (DRISDOL) 1.25 MG (50000 UT) CAPS capsule Take 1 capsule (50,000 Units total) by mouth every 7 (seven) days. 12 capsule 0  . vitamin E 400 UNIT capsule Take 400 Units by mouth daily.     No current facility-administered medications for this visit.    PHYSICAL EXAMINATION: ECOG PERFORMANCE STATUS: 0 - Asymptomatic  Vitals:   05/20/19 1334  BP: (!) 129/56  Pulse: 63  Resp: 17  Temp: 98.5 F (36.9 C)  SpO2: 100%   Filed Weights   05/20/19 1334  Weight: 192 lb 6.4 oz (87.3 kg)    GENERAL:alert, no distress and comfortable SKIN: skin color, texture, turgor are normal, no rashes or significant lesions EYES: normal, Conjunctiva are pink and non-injected, sclera clear  NECK: supple, thyroid normal size, non-tender, without nodularity LYMPH:  no palpable lymphadenopathy in the cervical, axillary  LUNGS: clear to auscultation and  percussion with normal breathing effort HEART: regular rate & rhythm and no murmurs and no lower extremity edema ABDOMEN:abdomen soft, non-tender and normal bowel sounds Musculoskeletal:no cyanosis of digits and no clubbing  NEURO: alert & oriented x 3 with fluent speech, no focal motor/sensory deficits BREAST: (+) left breast skin retraction (+) palpable 2.5x2cm mass in the inferior 6 o'clock position of the left breast below the nipple. Right breast exam benign.   LABORATORY DATA:  I have reviewed the data as listed CBC Latest Ref Rng & Units 05/20/2019 12/09/2018 11/09/2018  WBC 4.0 - 10.5 K/uL 6.1 5.4 9.0  Hemoglobin 12.0 - 15.0 g/dL 13.2 12.1 10.8(L)  Hematocrit 36.0 - 46.0 % 42.2 36.8 34.6(L)  Platelets 150 - 400 K/uL 239 304.0 197     CMP Latest Ref Rng & Units 05/20/2019 12/09/2018 11/09/2018  Glucose 70 - 99 mg/dL 86 87 130(H)  BUN 8 - 23 mg/dL 9 9 13   Creatinine 0.44 - 1.00 mg/dL 0.73 0.65 0.59  Sodium 135 - 145 mmol/L 137 136 133(L)  Potassium 3.5 - 5.1 mmol/L 4.4 4.4 4.2  Chloride 98 - 111 mmol/L 106 103 103  CO2 22 - 32 mmol/L 28 29 23   Calcium 8.9 - 10.3 mg/dL 9.0 9.2 8.3(L)  Total Protein 6.5 - 8.1 g/dL 8.5(H) 8.3 -  Total Bilirubin 0.3 - 1.2 mg/dL 0.4 0.5 -  Alkaline Phos 38 - 126 U/L 100 99 -  AST 15 - 41 U/L 43(H) 34 -  ALT 0 - 44 U/L 42 23 -      RADIOGRAPHIC STUDIES: I have personally reviewed the radiological images as listed and agreed with the findings in the report. No results found.   ASSESSMENT & PLAN:  PRARTHANA PARLIN is a 86 y.o. female with    1. Left breast cancer, 3 synchronized lesions, 2 of them with lobular features and one has ductal features. T1b-1cN0M0, stage I, ER+/PR+/HER2- -She was diagnosed in 01/2014. She has 3 synchronized early stage breast cancer without clinical lymph nodes involvement. I previously discussed that surgical resection, likely mastectomy due to the location of this 3 masses, would be the only curative treatment for her  breast cancer. She previously met with Dr. Barry Dienes to discuss this. She has declined surgery repeatedly.  -She started treatment with antiestrogen anastrozole in 02/2014. She notes stopping anastrozole in 08/2016 mistakenly and restarted on 01/05/17 and has been complaint since then. She has been tolerating anastrozole very well, we'll continue indefinitely if  no surgery.  -Her repeated mammogram and ultrasound showed partial response after 2 year of treatment -Continue diagnostic mammogram and [place] every 6 months for monitoring. -She has not been seen by me since 08/2017, almost 2 years. She notes she has been clinically doing well. She has been taking anastrozole this whole time, tolerating well. Will do labs today.  -Physical exam today shows her left breast mass 2x2.5cm, about the same as her initial diagnosis. She is overdue for mammogram and [place], will set this to be done in 2-3 weeks. I do not plan to do whole body scan at this time unless she has concerning symptoms.  -She will continue Anastrozole  -F/u in 3 months    2. Genetics -She has personal history of endometrial cancer, family history of breast cancer and pancreatic cancer. She declined genetic testing at this point.   3. Arthritis  -She'll continue follow-up with her primary care physician and orthopedic surgeon -She had left knee surgery in 10/2018. She recovered well and ambulating without cane now.   4. Osteopenia -DEXA scan on 08/18/16 showed T score -1.4, low risk osteopenia -The patient will begin to take calcium and vitamin D daily to support bone health.   5. Transaminitis  -Her lab previously showed elevated liver enzymes. She had mild intermittent transaminitis in the past -will continue monitoring   Plan -Mammogram and [place] in 2-3 weeks  -Continue Anastrozole, refilled today  -F/u in 3 months with lab    No problem-specific Assessment & Plan notes found for this encounter.   Orders Placed This Encounter   Procedures  . MM DIAG BREAST TOMO BILATERAL    INS CIGNA/UHC/TRICARE/MCR Pf: 08/10/17@bcg - no needs/  No implants/  Yes hx of br ca- fj/pt Epic order    Standing Status:   Future    Standing Expiration Date:   05/19/2020    Order Specific Question:   Reason for Exam (SYMPTOM  OR DIAGNOSIS REQUIRED)    Answer:   follo wup left breast cancer, on anastrozole    Order Specific Question:   Preferred imaging location?    Answer:   [facility]  . US BREAST LTD UNI LEFT INC AXILLA    INS CIGNA/UHC/TRICARE/MCR Pf: 08/10/17@bcg - no needs/  No implants/  Yes hx of br ca- fj/pt Epic order    Standing Status:   Future    Standing Expiration Date:   07/19/2020    Order Specific Question:   Reason for Exam (SYMPTOM  OR DIAGNOSIS REQUIRED)    Answer:   follow up left breast cancer    Order Specific Question:   Preferred imaging location?    Answer:   [facility]   All questions were answered. The patient knows to call the clinic with any problems, questions or concerns. No barriers to learning was detected. The total time spent in the appointment was 30 minutes.     Truitt Merle, MD 05/20/2019   I, Joslyn Devon, am acting as scribe for Truitt Merle, MD.   I have reviewed the above documentation for accuracy and completeness, and I agree with the above.

## 2019-05-19 ENCOUNTER — Ambulatory Visit (INDEPENDENT_AMBULATORY_CARE_PROVIDER_SITE_OTHER): Payer: Medicare Other

## 2019-05-19 ENCOUNTER — Other Ambulatory Visit: Payer: Self-pay

## 2019-05-19 VITALS — BP 120/80 | HR 65 | Temp 97.9°F | Resp 16 | Ht 65.0 in | Wt 190.8 lb

## 2019-05-19 DIAGNOSIS — Z0000 Encounter for general adult medical examination without abnormal findings: Secondary | ICD-10-CM | POA: Diagnosis not present

## 2019-05-19 NOTE — Progress Notes (Addendum)
Subjective:   Doris Zamora is a 86 y.o. female who presents for Medicare Annual (Subsequent) preventive examination.  Review of Systems:  Medicare Wellness Visit Cardiac Risk Factors include: advanced age (>79men, >51 women);dyslipidemia;obesity (BMI >30kg/m2)  Sleep Patterns: No issues with falling sleep; void at least four times during the night. Home Safety/Smoke Alarms: Feels safe in home; Smoke alarms in place. Living environment: 2-story home with her daughter; feels safe in her home; daughter is her support system. Seat Belt Safety/Bike Helmet: Wears seat belt.     Objective:     Vitals: BP 120/80 (BP Location: Left Arm, Patient Position: Sitting, Cuff Size: Normal)   Pulse 65   Temp 97.9 F (36.6 C)   Resp 16   Ht 5\' 5"  (1.651 m)   Wt 190 lb 12.8 oz (86.5 kg)   SpO2 95%   BMI 31.75 kg/m   Body mass index is 31.75 kg/m.  Advanced Directives 11/08/2018 11/08/2018 11/03/2018 09/12/2016 05/19/2016 02/19/2016 11/22/2015  Does Patient Have a Medical Advance Directive? No No No No No No No  Type of Advance Directive - - - - - - -  Copy of Healthcare Power of Attorney in Chart? - - - - - - -  Would patient like information on creating a medical advance directive? No - Patient declined No - Patient declined - No - Patient declined - - -    Tobacco Social History   Tobacco Use  Smoking Status Former Smoker  . Years: 50.00  . Types: Cigarettes  . Quit date: 02/10/1994  . Years since quitting: 25.2  Smokeless Tobacco Never Used     Counseling given: No   Clinical Intake:  Pre-visit preparation completed: Yes  Pain : No/denies pain Pain Score: 0-No pain     Nutritional Status: BMI > 30  Obese Nutritional Risks: None Diabetes: No  How often do you need to have someone help you when you read instructions, pamphlets, or other written materials from your doctor or pharmacy?: 1 - Never What is the last grade level you completed in school?: Retired  Art therapist Needed?: No  Information entered by :: [employer]. [employer], LPN  Past Medical History:  Diagnosis Date  . ANXIETY   . Arthritis   . Endometrial cancer ([place]) 2007  . Hearing loss   . Heartburn    occ  . Hyperglycemia 04/20/2014  . HYPERLIPIDEMIA   . Hypothyroidism   . INSOMNIA-SLEEP DISORDER-UNSPEC   . Peripheral edema   . Seasonal allergies   . Venous insufficiency   . WEIGHT LOSS    Past Surgical History:  Procedure Laterality Date  . ABDOMINAL HYSTERECTOMY  2007  . BREAST BIOPSY Left 01/26/2014   x3, malignant  . CATARACT EXTRACTION, BILATERAL    . OOPHORECTOMY  2007  . THYROIDECTOMY, PARTIAL  1983  . TOTAL KNEE ARTHROPLASTY Left 11/08/2018   Procedure: LEFT TOTAL KNEE ARTHROPLASTY;  Surgeon: Frederik Pear, MD;  Location: [facility];  Service: Orthopedics;  Laterality: Left;   Family History  Problem Relation Age of Onset  . Diabetes Mother   . Muscular dystrophy Father   . Cancer Other 40       breast cancer   . Cancer Sister 30       pancreatic cancer    Social History   Socioeconomic History  . Marital status: Widowed    Spouse name: Not on file  . Number of children: Not on file  . Years of education:  Not on file  . Highest education level: Not on file  Occupational History  . Not on file  Tobacco Use  . Smoking status: Former Smoker    Years: 50.00    Types: Cigarettes    Quit date: 02/10/1994    Years since quitting: 25.2  . Smokeless tobacco: Never Used  Substance and Sexual Activity  . Alcohol use: Yes    Alcohol/week: 7.0 standard drinks    Types: 7 Glasses of wine per week    Comment: 4 0z. HS to help her sleep  . Drug use: No    Comment: admiited to poat use of marjuana to help her sleep  . Sexual activity: Not on file  Other Topics Concern  . Not on file  Social History Narrative         Widow. 3 children - 2 living. retired Freight forwarder of Radio broadcast assistant Strain:   . Difficulty of  Paying Living Expenses:   Food Insecurity:   . Worried About Charity fundraiser in the Last Year:   . Arboriculturist in the Last Year:   Transportation Needs:   . Film/video editor (Medical):   Marland Kitchen Lack of Transportation (Non-Medical):   Physical Activity:   . Days of Exercise per Week:   . Minutes of Exercise per Session:   Stress:   . Feeling of Stress :   Social Connections:   . Frequency of Communication with Friends and Family:   . Frequency of Social Gatherings with Friends and Family:   . Attends Religious Services:   . Active Member of Clubs or Organizations:   . Attends Archivist Meetings:   Marland Kitchen Marital Status:     Outpatient Encounter Medications as of 05/19/2019  Medication Sig  . ALPRAZolam (XANAX) 0.5 MG tablet TAKE 1 TABLET(0.5 MG) BY MOUTH DAILY AS NEEDED  . anastrozole (ARIMIDEX) 1 MG tablet TAKE 1 TABLET(1 MG) BY MOUTH DAILY  . aspirin EC 81 MG tablet Take 1 tablet (81 mg total) by mouth 2 (two) times daily.  Marland Kitchen BLACK CURRANT SEED OIL PO Take 1 Dose by mouth daily.  . cetirizine (ZYRTEC) 10 MG tablet TAKE 1 TABLET(10 MG) BY MOUTH DAILY (Patient taking differently: Take 10 mg by mouth daily. )  . cholecalciferol (VITAMIN D) 1000 UNITS tablet Take 1,000 Units by mouth daily.   . Coenzyme Q10 (COQ10) 100 MG CAPS Take 100 mg by mouth daily.  . fluticasone (FLONASE) 50 MCG/ACT nasal spray Place 2 sprays into both nostrils daily.  . furosemide (LASIX) 20 MG tablet TAKE 1/2 TO 1 TABLET BY MOUTH DAILY AS NEEDED FOR SWELLING (Patient taking differently: Take 10-20 mg by mouth daily as needed for edema. )  . Krill Oil 350 MG CAPS Take 350 mg by mouth daily.  . Menthol, Topical Analgesic, (BIOFREEZE EX) Apply 1 patch topically daily as needed (pain).  . Naphazoline HCl (CLEAR EYES OP) Place 1 drop into both eyes daily as needed (dry eyes).  Marland Kitchen OVER THE COUNTER MEDICATION Take 1 tablet by mouth daily. Instaflex otc supplement  . oxyCODONE-acetaminophen  (PERCOCET/ROXICET) 5-325 MG tablet Take 1 tablet by mouth every 4 (four) hours as needed for severe pain. (Patient not taking: Reported on 02/03/2019)  . potassium chloride (K-DUR) 10 MEQ tablet TAKE 1 TABLET BY MOUTH EVERY DAY AS NEEDED WITH USE OF LASIX (Patient taking differently: Take 10 mEq by mouth daily as needed (when taking lasix). )  .  Selenium 200 MCG CAPS Take 200 mcg by mouth daily.  . Thiamine HCl (VITAMIN B-1) 250 MG tablet Take 250 mg by mouth daily.  Marland Kitchen tiZANidine (ZANAFLEX) 2 MG tablet Take 1 tablet (2 mg total) by mouth every 6 (six) hours as needed. (Patient not taking: Reported on 02/03/2019)  . vitamin B-12 (CYANOCOBALAMIN) 1000 MCG tablet Take 1,000 mcg by mouth daily.  . Vitamin D, Ergocalciferol, (DRISDOL) 1.25 MG (50000 UT) CAPS capsule Take 1 capsule (50,000 Units total) by mouth every 7 (seven) days.  . vitamin E 400 UNIT capsule Take 400 Units by mouth daily.   No facility-administered encounter medications on file as of 05/19/2019.    Activities of Daily Living In your present state of health, do you have any difficulty performing the following activities: 05/19/2019 11/08/2018  Hearing? N Y  Vision? N N  Difficulty concentrating or making decisions? N N  Walking or climbing stairs? N Y  Dressing or bathing? N N  Doing errands, shopping? N N  Preparing Food and eating ? N -  Using the Toilet? N -  In the past six months, have you accidently leaked urine? N -  Do you have problems with loss of bowel control? N -  Managing your Medications? N -  Managing your Finances? N -  Housekeeping or managing your Housekeeping? N -  Some recent data might be hidden    Patient Care Team: Biagio Borg, MD as PCP - General (Internal Medicine) [name] Guys, MD as Attending Physician (Ophthalmology) Jovita Kussmaul, MD as Consulting Physician (General Surgery) Truitt Merle, MD as Consulting Physician (Hematology)    Assessment:   This is a routine wellness examination for  Yannis.  Exercise Activities and Dietary recommendations Current Exercise Habits: The patient does not participate in regular exercise at present, Exercise limited by: orthopedic condition(s)  Goals    . Patient Stated     Would like to lose 50 pounds and get out and walk in the park.       Fall Risk Fall Risk  05/19/2019 02/03/2019 12/09/2018 11/18/2017 06/05/2017  Falls in the past year? 0 0 0 No No  Number falls in past yr: 0 0 - - -  Injury with Fall? 0 0 - - -  Risk for fall due to : No Fall Risks - - - -  Follow up Falls evaluation completed;Education provided;Falls prevention discussed - - - -   Is the patient's home free of loose throw rugs in walkways, pet beds, electrical cords, etc?   yes      Grab bars in the bathroom? yes      Handrails on the stairs?   yes      Adequate lighting?   yes  Depression Screen PHQ 2/9 Scores 05/19/2019 12/09/2018 11/18/2017 06/05/2017  PHQ - 2 Score 0 0 0 0  Exception Documentation - - - -     Cognitive Function     6CIT Screen 05/19/2019  What Year? 0 points  What month? 0 points  What time? 0 points  Count back from 20 0 points  Months in reverse 0 points  Repeat phrase 0 points  Total Score 0    Immunization History  Administered Date(s) Administered  . PFIZER SARS-COV-2 Vaccination 04/15/2019, 05/11/2019  . Pneumococcal Conjugate-13 02/15/2016  . Td 02/14/2008    Qualifies for Shingles Vaccine? declined  Screening Tests Health Maintenance  Topic Date Due  . TETANUS/TDAP  12/09/2019 (Originally 02/13/2018)  . PNA vac  Low Risk Adult (2 of 2 - PPSV23) 12/09/2019 (Originally 02/14/2017)  . INFLUENZA VACCINE  09/11/2019  . DEXA SCAN  Completed    Cancer Screenings: Lung: Low Dose CT Chest recommended if Age 20-80 years, 30 pack-year currently smoking OR have quit w/in 15years. Patient does not qualify. Breast:  Up to date on Mammogram? Not recommended due to age   Up to date of Bone Density/Dexa? Not recommended due to  age Colorectal: Not recommended due to age     Plan:     Reviewed health maintenance screenings with patient today and relevant education, vaccines, and/or referrals were provided.    Continue doing brain stimulating activities (puzzles, reading, adult coloring books, staying active) to keep memory sharp.    Continue to eat heart healthy diet (full of fruits, vegetables, whole grains, lean protein, water--limit salt, fat, and sugar intake) and increase physical activity as tolerated.  I have personally reviewed and noted the following in the patient's chart:   . Medical and social history . Use of alcohol, tobacco or illicit drugs  . Current medications and supplements . Functional ability and status . Nutritional status . Physical activity . Advanced directives . List of other physicians . Hospitalizations, surgeries, and ER visits in previous 12 months . Vitals . Screenings to include cognitive, depression, and falls . Referrals and appointments  In addition, I have reviewed and discussed with patient certain preventive protocols, quality metrics, and best practice recommendations. A written personalized care plan for preventive services as well as general preventive health recommendations were provided to patient.     Sheral Flow, LPN  08/13/5327 Nurse Health Advisor   Medical screening examination/treatment/procedure(s) were performed by non-physician practitioner and as supervising physician I was immediately available for consultation/collaboration. I agree with above. Cathlean Cower, MD

## 2019-05-19 NOTE — Patient Instructions (Signed)
Doris Zamora , Thank you for taking time to come for your Medicare Wellness Visit. I appreciate your ongoing commitment to your health goals. Please review the following plan we discussed and let me know if I can assist you in the future.   Screening recommendations/referrals: Colorectal Screening: not recommended due to age 86: not recommended due to age Bone Density: last done 08/18/2016  Vision and Dental Exams: Recommended annual ophthalmology exams for early detection of glaucoma and other disorders of the eye Recommended annual dental exams for proper oral hygiene  Vaccinations: Influenza vaccine: declined Pneumococcal vaccine: declined Tdap vaccine: declined.  Shingles vaccine: declined; Please call your insurance company to determine your out of pocket expense for the Shingrix vaccine. You may receive this vaccine at your local pharmacy.  Advanced directives: Advance directives discussed with you today. I have provided a copy for you to complete at home and have notarized. Once this is complete please bring a copy in to our office so we can scan it into your chart.  Goals: Recommend to drink at least 6-8 8oz glasses of water per day.  Recommend to exercise for at least 150 minutes per week.  Recommend to remove any items from the home that may cause slips or trips.  Recommend to decrease portion sizes by eating 3 small healthy meals and at least 2 healthy snacks per day.  Recommend to begin DASH diet as directed below  Recommend to continue efforts to reduce smoking habits until no longer smoking. Smoking Cessation literature is attached below.  Next appointment: Please schedule your Annual Wellness Visit with your Nurse Health Advisor in one year.  Preventive Care 54 Years and Older, Female Preventive care refers to lifestyle choices and visits with your health care provider that can promote health and wellness. What does preventive care include?  A yearly physical  exam. This is also called an annual well check.  Dental exams once or twice a year.  Routine eye exams. Ask your health care provider how often you should have your eyes checked.  Personal lifestyle choices, including:  Daily care of your teeth and gums.  Regular physical activity.  Eating a healthy diet.  Avoiding tobacco and drug use.  Limiting alcohol use.  Practicing safe sex.  Taking low-dose aspirin every day if recommended by your health care provider.  Taking vitamin and mineral supplements as recommended by your health care provider. What happens during an annual well check? The services and screenings done by your health care provider during your annual well check will depend on your age, overall health, lifestyle risk factors, and family history of disease. Counseling  Your health care provider may ask you questions about your:  Alcohol use.  Tobacco use.  Drug use.  Emotional well-being.  Home and relationship well-being.  Sexual activity.  Eating habits.  History of falls.  Memory and ability to understand (cognition).  Work and work Statistician.  Reproductive health. Screening  You may have the following tests or measurements:  Height, weight, and BMI.  Blood pressure.  Lipid and cholesterol levels. These may be checked every 5 years, or more frequently if you are over 35 years old.  Skin check.  Lung cancer screening. You may have this screening every year starting at age 46 if you have a 30-pack-year history of smoking and currently smoke or have quit within the past 15 years.  Fecal occult blood test (FOBT) of the stool. You may have this test every year starting at  age 29.  Flexible sigmoidoscopy or colonoscopy. You may have a sigmoidoscopy every 5 years or a colonoscopy every 10 years starting at age 59.  Hepatitis C blood test.  Hepatitis B blood test.  Sexually transmitted disease (STD) testing.  Diabetes screening. This is  done by checking your blood sugar (glucose) after you have not eaten for a while (fasting). You may have this done every 1-3 years.  Bone density scan. This is done to screen for osteoporosis. You may have this done starting at age 9.  Mammogram. This may be done every 1-2 years. Talk to your health care provider about how often you should have regular mammograms. Talk with your health care provider about your test results, treatment options, and if necessary, the need for more tests. Vaccines  Your health care provider may recommend certain vaccines, such as:  Influenza vaccine. This is recommended every year.  Tetanus, diphtheria, and acellular pertussis (Tdap, Td) vaccine. You may need a Td booster every 10 years.  Zoster vaccine. You may need this after age 87.  Pneumococcal 13-valent conjugate (PCV13) vaccine. One dose is recommended after age 79.  Pneumococcal polysaccharide (PPSV23) vaccine. One dose is recommended after age 72. Talk to your health care provider about which screenings and vaccines you need and how often you need them. This information is not intended to replace advice given to you by your health care provider. Make sure you discuss any questions you have with your health care provider. Document Released: 02/23/2015 Document Revised: 10/17/2015 Document Reviewed: 11/28/2014 Elsevier Interactive Patient Education  2017 [place] Prevention in the Home Falls can cause injuries. They can happen to people of all ages. There are many things you can do to make your home safe and to help prevent falls. What can I do on the outside of my home?  Regularly fix the edges of walkways and driveways and fix any cracks.  Remove anything that might make you trip as you walk through a door, such as a raised step or threshold.  Trim any bushes or trees on the path to your home.  Use bright outdoor lighting.  Clear any walking paths of anything that might make  someone trip, such as rocks or tools.  Regularly check to see if handrails are loose or broken. Make sure that both sides of any steps have handrails.  Any raised decks and porches should have guardrails on the edges.  Have any leaves, snow, or ice cleared regularly.  Use sand or salt on walking paths during winter.  Clean up any spills in your garage right away. This includes oil or grease spills. What can I do in the bathroom?  Use night lights.  Install grab bars by the toilet and in the tub and shower. Do not use towel bars as grab bars.  Use non-skid mats or decals in the tub or shower.  If you need to sit down in the shower, use a plastic, non-slip stool.  Keep the floor dry. Clean up any water that spills on the floor as soon as it happens.  Remove soap buildup in the tub or shower regularly.  Attach bath mats securely with double-sided non-slip rug tape.  Do not have throw rugs and other things on the floor that can make you trip. What can I do in the bedroom?  Use night lights.  Make sure that you have a light by your bed that is easy to reach.  Do not use any  sheets or blankets that are too big for your bed. They should not hang down onto the floor.  Have a firm chair that has side arms. You can use this for support while you get dressed.  Do not have throw rugs and other things on the floor that can make you trip. What can I do in the kitchen?  Clean up any spills right away.  Avoid walking on wet floors.  Keep items that you use a lot in easy-to-reach places.  If you need to reach something above you, use a strong step stool that has a grab bar.  Keep electrical cords out of the way.  Do not use floor polish or wax that makes floors slippery. If you must use wax, use non-skid floor wax.  Do not have throw rugs and other things on the floor that can make you trip. What can I do with my stairs?  Do not leave any items on the stairs.  Make sure that  there are handrails on both sides of the stairs and use them. Fix handrails that are broken or loose. Make sure that handrails are as long as the stairways.  Check any carpeting to make sure that it is firmly attached to the stairs. Fix any carpet that is loose or worn.  Avoid having throw rugs at the top or bottom of the stairs. If you do have throw rugs, attach them to the floor with carpet tape.  Make sure that you have a light switch at the top of the stairs and the bottom of the stairs. If you do not have them, ask someone to add them for you. What else can I do to help prevent falls?  Wear shoes that:  Do not have high heels.  Have rubber bottoms.  Are comfortable and fit you well.  Are closed at the toe. Do not wear sandals.  If you use a stepladder:  Make sure that it is fully opened. Do not climb a closed stepladder.  Make sure that both sides of the stepladder are locked into place.  Ask someone to hold it for you, if possible.  Clearly mark and make sure that you can see:  Any grab bars or handrails.  First and last steps.  Where the edge of each step is.  Use tools that help you move around (mobility aids) if they are needed. These include:  Canes.  Walkers.  Scooters.  Crutches.  Turn on the lights when you go into a dark area. Replace any light bulbs as soon as they burn out.  Set up your furniture so you have a clear path. Avoid moving your furniture around.  If any of your floors are uneven, fix them.  If there are any pets around you, be aware of where they are.  Review your medicines with your doctor. Some medicines can make you feel dizzy. This can increase your chance of falling. Ask your doctor what other things that you can do to help prevent falls. This information is not intended to replace advice given to you by your health care provider. Make sure you discuss any questions you have with your health care provider. Document Released:  11/23/2008 Document Revised: 07/05/2015 Document Reviewed: 03/03/2014 Elsevier Interactive Patient Education  2017 [employer].

## 2019-05-20 ENCOUNTER — Other Ambulatory Visit: Payer: Self-pay

## 2019-05-20 ENCOUNTER — Encounter: Payer: Self-pay | Admitting: Hematology

## 2019-05-20 ENCOUNTER — Telehealth: Payer: Self-pay | Admitting: Hematology

## 2019-05-20 ENCOUNTER — Ambulatory Visit: Payer: Medicare Other

## 2019-05-20 ENCOUNTER — Inpatient Hospital Stay: Payer: Medicare Other

## 2019-05-20 ENCOUNTER — Inpatient Hospital Stay: Payer: Medicare Other | Attending: Hematology | Admitting: Hematology

## 2019-05-20 VITALS — BP 129/56 | HR 63 | Temp 98.5°F | Resp 17 | Ht 65.0 in | Wt 192.4 lb

## 2019-05-20 DIAGNOSIS — E039 Hypothyroidism, unspecified: Secondary | ICD-10-CM | POA: Diagnosis not present

## 2019-05-20 DIAGNOSIS — C50912 Malignant neoplasm of unspecified site of left female breast: Secondary | ICD-10-CM | POA: Insufficient documentation

## 2019-05-20 DIAGNOSIS — E785 Hyperlipidemia, unspecified: Secondary | ICD-10-CM | POA: Insufficient documentation

## 2019-05-20 DIAGNOSIS — F419 Anxiety disorder, unspecified: Secondary | ICD-10-CM | POA: Diagnosis not present

## 2019-05-20 DIAGNOSIS — Z800 Family history of malignant neoplasm of digestive organs: Secondary | ICD-10-CM | POA: Insufficient documentation

## 2019-05-20 DIAGNOSIS — Z803 Family history of malignant neoplasm of breast: Secondary | ICD-10-CM | POA: Diagnosis not present

## 2019-05-20 DIAGNOSIS — Z170 Estrogen receptor positive status [ER+]: Secondary | ICD-10-CM | POA: Insufficient documentation

## 2019-05-20 DIAGNOSIS — Z8572 Personal history of non-Hodgkin lymphomas: Secondary | ICD-10-CM | POA: Insufficient documentation

## 2019-05-20 DIAGNOSIS — Z79811 Long term (current) use of aromatase inhibitors: Secondary | ICD-10-CM | POA: Insufficient documentation

## 2019-05-20 DIAGNOSIS — R944 Abnormal results of kidney function studies: Secondary | ICD-10-CM | POA: Diagnosis not present

## 2019-05-20 DIAGNOSIS — M8580 Other specified disorders of bone density and structure, unspecified site: Secondary | ICD-10-CM | POA: Insufficient documentation

## 2019-05-20 DIAGNOSIS — G4700 Insomnia, unspecified: Secondary | ICD-10-CM | POA: Diagnosis not present

## 2019-05-20 DIAGNOSIS — Z79899 Other long term (current) drug therapy: Secondary | ICD-10-CM | POA: Insufficient documentation

## 2019-05-20 DIAGNOSIS — M129 Arthropathy, unspecified: Secondary | ICD-10-CM | POA: Insufficient documentation

## 2019-05-20 LAB — CBC WITH DIFFERENTIAL (CANCER CENTER ONLY)
Abs Immature Granulocytes: 0.01 10*3/uL (ref 0.00–0.07)
Basophils Absolute: 0.1 10*3/uL (ref 0.0–0.1)
Basophils Relative: 1 %
Eosinophils Absolute: 0.1 10*3/uL (ref 0.0–0.5)
Eosinophils Relative: 1 %
HCT: 42.2 % (ref 36.0–46.0)
Hemoglobin: 13.2 g/dL (ref 12.0–15.0)
Immature Granulocytes: 0 %
Lymphocytes Relative: 46 %
Lymphs Abs: 2.8 10*3/uL (ref 0.7–4.0)
MCH: 29.3 pg (ref 26.0–34.0)
MCHC: 31.3 g/dL (ref 30.0–36.0)
MCV: 93.8 fL (ref 80.0–100.0)
Monocytes Absolute: 0.7 10*3/uL (ref 0.1–1.0)
Monocytes Relative: 11 %
Neutro Abs: 2.5 10*3/uL (ref 1.7–7.7)
Neutrophils Relative %: 41 %
Platelet Count: 239 10*3/uL (ref 150–400)
RBC: 4.5 MIL/uL (ref 3.87–5.11)
RDW: 15 % (ref 11.5–15.5)
WBC Count: 6.1 10*3/uL (ref 4.0–10.5)
nRBC: 0 % (ref 0.0–0.2)

## 2019-05-20 LAB — CMP (CANCER CENTER ONLY)
ALT: 42 U/L (ref 0–44)
AST: 43 U/L — ABNORMAL HIGH (ref 15–41)
Albumin: 3.1 g/dL — ABNORMAL LOW (ref 3.5–5.0)
Alkaline Phosphatase: 100 U/L (ref 38–126)
Anion gap: 3 — ABNORMAL LOW (ref 5–15)
BUN: 9 mg/dL (ref 8–23)
CO2: 28 mmol/L (ref 22–32)
Calcium: 9 mg/dL (ref 8.9–10.3)
Chloride: 106 mmol/L (ref 98–111)
Creatinine: 0.73 mg/dL (ref 0.44–1.00)
GFR, Est AFR Am: >60 mL/min (ref >60)
GFR, Estimated: >60 mL/min (ref >60)
Glucose, Bld: 86 mg/dL (ref 70–99)
Potassium: 4.4 mmol/L (ref 3.5–5.1)
Sodium: 137 mmol/L (ref 135–145)
Total Bilirubin: 0.4 mg/dL (ref 0.3–1.2)
Total Protein: 8.5 g/dL — ABNORMAL HIGH (ref 6.5–8.1)

## 2019-05-20 MED ORDER — ANASTROZOLE 1 MG PO TABS: ORAL_TABLET | ORAL | 1 refills | Status: DC

## 2019-05-20 NOTE — Telephone Encounter (Signed)
Scheduled appts per 4/9 los. Gave pt a print out of AVS.

## 2019-06-04 ENCOUNTER — Other Ambulatory Visit: Payer: Self-pay | Admitting: Internal Medicine

## 2019-06-04 NOTE — Telephone Encounter (Signed)
Please refill as per office routine med refill policy (all routine meds refilled for 3 mo or monthly per pt preference up to one year from last visit, then month to month Ghazal period for 3 mo, then further med refills will have to be denied)  

## 2019-06-08 ENCOUNTER — Other Ambulatory Visit: Payer: Self-pay

## 2019-06-08 ENCOUNTER — Ambulatory Visit
Admission: RE | Admit: 2019-06-08 | Discharge: 2019-06-08 | Disposition: A | Discharge status: Home / Self Care | Payer: BC Managed Care – PPO | Source: Ambulatory Visit | Attending: Hematology | Admitting: Hematology

## 2019-06-08 ENCOUNTER — Ambulatory Visit: Admission: RE | Admit: 2019-06-08 | Payer: BC Managed Care – PPO | Source: Ambulatory Visit

## 2019-06-08 DIAGNOSIS — Z170 Estrogen receptor positive status [ER+]: Secondary | ICD-10-CM

## 2019-06-08 DIAGNOSIS — C50912 Malignant neoplasm of unspecified site of left female breast: Secondary | ICD-10-CM

## 2019-07-25 ENCOUNTER — Ambulatory Visit (INDEPENDENT_AMBULATORY_CARE_PROVIDER_SITE_OTHER): Payer: Medicare Other | Admitting: Sports Medicine

## 2019-07-25 ENCOUNTER — Other Ambulatory Visit: Payer: Self-pay

## 2019-07-25 DIAGNOSIS — M545 Low back pain, unspecified: Secondary | ICD-10-CM

## 2019-07-25 MED ORDER — METHYLPREDNISOLONE ACETATE 80 MG/ML IJ SUSP
80.0000 mg | Freq: Once | INTRAMUSCULAR | Status: AC
  Administered 2019-07-25: 80 mg via INTRAMUSCULAR

## 2019-07-25 NOTE — Progress Notes (Signed)
   Doris Zamora is a 86 y.o. female who presents to [facility] today for the following:  Left hip pain Patient presenting today for left hip pain.  Patient reports pain x3 months.  States that pain can happen randomly throughout the day and is a constant ache when it occurs.  Last night she had pain with using a heating pad.  It is in the back of her hip not the front.  Denies any fall or injury.  States she can walk but has to use a cane for stability.  Recently had a total knee replacement on 05/2018 and states she is doing well after surgery.  After her knee replacement she did not use to use a cane but is only using it now due to hip pain.  Is not using any oral medications but is using heating pads and BenGay which helped.  Is also using vitamin such as B1, B12, ED which also helped.  Does have a normal gait.  Denies any pain radiating to legs.   PMH reviewed. Knee arthritis, hyperglycemia, breast cancer ROS as above. Medications reviewed.  Exam:  BP (!) 141/51   Ht 5\' 6"  (1.676 m)   Wt 190 lb (86.2 kg)   BMI 30.67 kg/m  Gen: Well NAD MSK: Hip:  - Inspection: No gross deformity, no swelling, erythema, or ecchymosis - Palpation: No TTP, specifically none over greater trochanter - ROM: Normal range of motion on Flexion, extension, abduction, internal and external rotation - Strength: Normal strength. - Neuro/vasc: NV intact distally - Special Tests: Negative FABER and FADIR.    Neck/Back: - Inspection: no gross deformity or asymmetry, swelling or ecchymosis - Palpation: no TTP over spinous processes   Assessment and Plan: 1) Left low back pain History and physical exam consistent with left low back pain rather than from a hip etiology.  Patient has full range of motion of hip and good strength.  Patient's pain is mainly posterior which points to a low back etiology.  Patient does seem to improve with heating pads and BenGay which we advised to continue.  Will give 80 mg Depo-Medrol IM  injection today to help with some acute inflammation.  Advised to follow-up as needed for pain.    Dalphine Handing, PGY-3 [facility] Resident 07/25/2019 3:50 PM  Patient seen and evaluated with the resident.  I agree with the above plan of care.  Patient was injected with 80 mg of Depo Medrol IM.  If symptoms persist consider merits of lumbar spine x-rays.  Follow-up for ongoing or recalcitrant issues. Of note, she is status post recent left knee total knee arthroplasty.  She is doing extremely well postoperatively and pleased with her results.

## 2019-07-25 NOTE — Assessment & Plan Note (Signed)
History and physical exam consistent with left low back pain rather than from a hip etiology.  Patient has full range of motion of hip and good strength.  Patient's pain is mainly posterior which points to a low back etiology.  Patient does seem to improve with heating pads and BenGay which we advised to continue.  Will give 80 mg Depo-Medrol IM injection today to help with some acute inflammation.  Advised to follow-up as needed for pain.

## 2019-08-04 ENCOUNTER — Encounter: Payer: Self-pay | Admitting: Internal Medicine

## 2019-08-04 ENCOUNTER — Ambulatory Visit (INDEPENDENT_AMBULATORY_CARE_PROVIDER_SITE_OTHER): Payer: Medicare Other | Admitting: Internal Medicine

## 2019-08-04 ENCOUNTER — Other Ambulatory Visit: Payer: Self-pay

## 2019-08-04 VITALS — BP 136/62 | HR 63 | Temp 97.7°F | Ht 66.0 in | Wt 187.0 lb

## 2019-08-04 DIAGNOSIS — Z0000 Encounter for general adult medical examination without abnormal findings: Secondary | ICD-10-CM | POA: Diagnosis not present

## 2019-08-04 DIAGNOSIS — E559 Vitamin D deficiency, unspecified: Secondary | ICD-10-CM | POA: Diagnosis not present

## 2019-08-04 DIAGNOSIS — E538 Deficiency of other specified B group vitamins: Secondary | ICD-10-CM | POA: Diagnosis not present

## 2019-08-04 DIAGNOSIS — R739 Hyperglycemia, unspecified: Secondary | ICD-10-CM | POA: Diagnosis not present

## 2019-08-04 DIAGNOSIS — M545 Low back pain, unspecified: Secondary | ICD-10-CM

## 2019-08-04 LAB — URINALYSIS, ROUTINE W REFLEX MICROSCOPIC
Bilirubin Urine: NEGATIVE
Hgb urine dipstick: NEGATIVE
Ketones, ur: NEGATIVE
Leukocytes,Ua: NEGATIVE
Nitrite: NEGATIVE
RBC / HPF: NONE SEEN (ref 0–?)
Specific Gravity, Urine: 1.025 (ref 1.000–1.030)
Total Protein, Urine: NEGATIVE
Urine Glucose: NEGATIVE
Urobilinogen, UA: 1 (ref 0.0–1.0)
pH: 6.5 (ref 5.0–8.0)

## 2019-08-04 LAB — VITAMIN B12: Vitamin B-12: 802 pg/mL (ref 211–911)

## 2019-08-04 LAB — CBC WITH DIFFERENTIAL/PLATELET
Basophils Absolute: 0.1 10*3/uL (ref 0.0–0.1)
Basophils Relative: 1 % (ref 0.0–3.0)
Eosinophils Absolute: 0 10*3/uL (ref 0.0–0.7)
Eosinophils Relative: 0.8 % (ref 0.0–5.0)
HCT: 41.1 % (ref 36.0–46.0)
Hemoglobin: 13.7 g/dL (ref 12.0–15.0)
Lymphocytes Relative: 32.7 % (ref 12.0–46.0)
Lymphs Abs: 1.8 10*3/uL (ref 0.7–4.0)
MCHC: 33.3 g/dL (ref 30.0–36.0)
MCV: 91.7 fl (ref 78.0–100.0)
Monocytes Absolute: 0.7 10*3/uL (ref 0.1–1.0)
Monocytes Relative: 12.2 % — ABNORMAL HIGH (ref 3.0–12.0)
Neutro Abs: 3 10*3/uL (ref 1.4–7.7)
Neutrophils Relative %: 53.3 % (ref 43.0–77.0)
Platelets: 207 10*3/uL (ref 150.0–400.0)
RBC: 4.48 Mil/uL (ref 3.87–5.11)
RDW: 14.5 % (ref 11.5–15.5)
WBC: 5.6 10*3/uL (ref 4.0–10.5)

## 2019-08-04 LAB — TSH: TSH: 1.64 u[IU]/mL (ref 0.35–4.50)

## 2019-08-04 LAB — VITAMIN D 25 HYDROXY (VIT D DEFICIENCY, FRACTURES): VITD: 26.65 ng/mL — ABNORMAL LOW (ref 30.00–100.00)

## 2019-08-04 LAB — HEMOGLOBIN A1C: Hgb A1c MFr Bld: 5.3 % (ref 4.6–6.5)

## 2019-08-04 MED ORDER — TRAMADOL HCL 50 MG PO TABS: 50.0000 mg | ORAL_TABLET | Freq: Four times a day (QID) | ORAL | 0 refills | Status: DC | PRN

## 2019-08-04 NOTE — Progress Notes (Signed)
Subjective:    Patient ID: Doris Zamora, female    DOB: [birth year], 86 y.o.   MRN: [id number]  HPI  Here for wellness and f/u;  Overall doing ok;  Pt denies Chest pain, worsening SOB, DOE, wheezing, orthopnea, PND, worsening LE edema, palpitations, dizziness or syncope.  Pt denies neurological change such as new headache, facial or extremity weakness.  Pt denies polydipsia, polyuria, or low sugar symptoms. Pt states overall good compliance with treatment and medications, good tolerability, and has been trying to follow appropriate diet.  Pt denies worsening depressive symptoms, suicidal ideation or panic. No fever, night sweats, wt loss, loss of appetite, or other constitutional symptoms.  Pt states good ability with ADL's, has low fall risk, home safety reviewed and adequate, no other significant changes in hearing or vision, and only occasionally active with exercise.  Pt continues to have recurring LBP without change in severity, bowel or bladder change, fever, wt loss,  worsening LE pain/numbness/weakness, gait change or falls. Past Medical History:  Diagnosis Date  . ANXIETY   . Arthritis   . Endometrial cancer ([place]) 2007  . Hearing loss   . Heartburn    occ  . Hyperglycemia 04/20/2014  . HYPERLIPIDEMIA   . Hypothyroidism   . INSOMNIA-SLEEP DISORDER-UNSPEC   . Peripheral edema   . Seasonal allergies   . Venous insufficiency   . WEIGHT LOSS    Past Surgical History:  Procedure Laterality Date  . ABDOMINAL HYSTERECTOMY  2007  . BREAST BIOPSY Left 01/26/2014   x3, malignant  . CATARACT EXTRACTION, BILATERAL    . OOPHORECTOMY  2007  . THYROIDECTOMY, PARTIAL  1983  . TOTAL KNEE ARTHROPLASTY Left 11/08/2018   Procedure: LEFT TOTAL KNEE ARTHROPLASTY;  Surgeon: Frederik Pear, MD;  Location: [facility];  Service: Orthopedics;  Laterality: Left;    reports that she quit smoking about 25 years ago. Her smoking use included cigarettes. She quit after 50.00 years of use. She has never used  smokeless tobacco. She reports current alcohol use of about 7.0 standard drinks of alcohol per week. She reports that she does not use drugs. family history includes Cancer (age of onset: 77) in an other family member; Cancer (age of onset: 77) in her sister; Diabetes in her mother; Muscular dystrophy in her father. Allergies  Allergen Reactions  . Synthroid [Levothyroxine Sodium] Swelling    Happened yrs ago  . Pneumovax [Pneumococcal Polysaccharide Vaccine] Swelling   Current Outpatient Medications on File Prior to Visit  Medication Sig Dispense Refill  . ALPRAZolam (XANAX) 0.5 MG tablet TAKE 1 TABLET(0.5 MG) BY MOUTH DAILY AS NEEDED 30 tablet 5  . anastrozole (ARIMIDEX) 1 MG tablet TAKE 1 TABLET(1 MG) BY MOUTH DAILY 90 tablet 1  . BLACK CURRANT SEED OIL PO Take 1 Dose by mouth daily.    . cetirizine (ZYRTEC) 10 MG tablet TAKE 1 TABLET(10 MG) BY MOUTH DAILY (Patient taking differently: Take 10 mg by mouth daily. ) 30 tablet 5  . cholecalciferol (VITAMIN D) 1000 UNITS tablet Take 1,000 Units by mouth daily.     . Coenzyme Q10 (COQ10) 100 MG CAPS Take 100 mg by mouth daily.    . fluticasone (FLONASE) 50 MCG/ACT nasal spray Place 2 sprays into both nostrils daily. 16 g 2  . Krill Oil 350 MG CAPS Take 350 mg by mouth daily.    . Menthol, Topical Analgesic, (BIOFREEZE EX) Apply 1 patch topically daily as needed (pain).    . Naphazoline HCl (CLEAR  EYES OP) Place 1 drop into both eyes daily as needed (dry eyes).    Marland Kitchen OVER THE COUNTER MEDICATION Take 1 tablet by mouth daily. Instaflex otc supplement    . potassium chloride (KLOR-CON) 10 MEQ tablet TAKE 1 TABLET BY MOUTH EVERY DAY AS NEEDED FOR WITH USE OF LASIX 30 tablet 5  . Thiamine HCl (VITAMIN B-1) 250 MG tablet Take 250 mg by mouth daily.    . vitamin B-12 (CYANOCOBALAMIN) 1000 MCG tablet Take 1,000 mcg by mouth daily.    . vitamin E 400 UNIT capsule Take 400 Units by mouth daily.     No current facility-administered medications on file  prior to visit.   Review of Systems All otherwise neg per pt     Objective:   Physical Exam BP 136/62 (BP Location: Left Arm, Patient Position: Sitting, Cuff Size: Large)   Pulse 63   Temp 97.7 F (36.5 C) (Oral)   Ht 5\' 6"  (1.676 m)   Wt 187 lb (84.8 kg)   SpO2 98%   BMI 30.18 kg/m  VS noted,  Constitutional: Pt appears in NAD HENT: Head: NCAT.  Right Ear: External ear normal.  Left Ear: External ear normal.  Eyes: . Pupils are equal, round, and reactive to light. Conjunctivae and EOM are normal Nose: without d/c or deformity Neck: Neck supple. Gross normal ROM Cardiovascular: Normal rate and regular rhythm.   Pulmonary/Chest: Effort normal and breath sounds without rales or wheezing.  Abd:  Soft, NT, ND, + BS, no organomegaly Neurological: Pt is alert. At baseline orientation, motor grossly intact Skin: Skin is warm. No rashes, other new lesions, no LE edema Psychiatric: Pt behavior is normal without agitation  Lab Results  Component Value Date   WBC 5.6 08/04/2019   HGB 13.7 08/04/2019   HCT 41.1 08/04/2019   PLT 207.0 08/04/2019   GLUCOSE 77 08/04/2019   CHOL 180 08/04/2019   TRIG 61.0 08/04/2019   HDL 64.50 08/04/2019   LDLDIRECT 129.4 02/14/2008   LDLCALC 104 (H) 08/04/2019   ALT 37 (H) 08/04/2019   AST 58 (H) 08/04/2019   NA 138 08/04/2019   K 4.7 08/04/2019   CL 102 08/04/2019   CREATININE 0.75 08/04/2019   BUN 13 08/04/2019   CO2 29 08/04/2019   TSH 1.64 08/04/2019   INR 1.0 11/03/2018   HGBA1C 5.3 08/04/2019      Assessment & Plan:

## 2019-08-04 NOTE — Patient Instructions (Signed)
Please take all new medication as prescribed - the pain medication as needed  Please note to start we can only give a limited prescription for the tramadol, so if you need more, please call for refill   Please continue all other medications as before, and refills have been done if requested.  Please have the pharmacy call with any other refills you may need.  Please continue your efforts at being more active, low cholesterol diet, and weight control.  You are otherwise up to date with prevention measures today.  Please keep your appointments with your specialists as you may have planned  Please go to the LAB at the blood drawing area for the tests to be done  You will be contacted by phone if any changes need to be made immediately.  Otherwise, you will receive a letter about your results with an explanation, but please check with MyChart first.  Please remember to sign up for MyChart if you have not done so, as this will be important to you in the future with finding out test results, communicating by private email, and scheduling acute appointments online when needed.  Please make an Appointment to return in Jan 2022

## 2019-08-05 ENCOUNTER — Encounter: Payer: Self-pay | Admitting: Internal Medicine

## 2019-08-05 LAB — HEPATIC FUNCTION PANEL
ALT: 37 U/L — ABNORMAL HIGH (ref 0–35)
AST: 58 U/L — ABNORMAL HIGH (ref 0–37)
Albumin: 3.8 g/dL (ref 3.5–5.2)
Alkaline Phosphatase: 100 U/L (ref 39–117)
Bilirubin, Direct: 0.2 mg/dL (ref 0.0–0.3)
Total Bilirubin: 0.6 mg/dL (ref 0.2–1.2)
Total Protein: 8.5 g/dL — ABNORMAL HIGH (ref 6.0–8.3)

## 2019-08-05 LAB — LIPID PANEL
Cholesterol: 180 mg/dL (ref 0–200)
HDL: 64.5 mg/dL (ref >39.00)
LDL Cholesterol: 104 mg/dL — ABNORMAL HIGH (ref 0–99)
NonHDL: 115.76
Total CHOL/HDL Ratio: 3
Triglycerides: 61 mg/dL (ref 0.0–149.0)
VLDL: 12.2 mg/dL (ref 0.0–40.0)

## 2019-08-05 LAB — BASIC METABOLIC PANEL
BUN: 13 mg/dL (ref 6–23)
CO2: 29 mEq/L (ref 19–32)
Calcium: 9.7 mg/dL (ref 8.4–10.5)
Chloride: 102 mEq/L (ref 96–112)
Creatinine, Ser: 0.75 mg/dL (ref 0.40–1.20)
GFR: 88.41 mL/min (ref >60.00)
Glucose, Bld: 77 mg/dL (ref 70–99)
Potassium: 4.7 mEq/L (ref 3.5–5.1)
Sodium: 138 mEq/L (ref 135–145)

## 2019-08-06 ENCOUNTER — Encounter: Payer: Self-pay | Admitting: Internal Medicine

## 2019-08-06 NOTE — Assessment & Plan Note (Signed)
stable overall by history and exam, recent data reviewed with pt, and pt to continue medical treatment as before,  to f/u any worsening symptoms or concerns  

## 2019-08-06 NOTE — Assessment & Plan Note (Signed)
Stable overall and improved after ESI last wk,, for tramadol prn

## 2019-08-06 NOTE — Assessment & Plan Note (Signed)

## 2019-08-26 ENCOUNTER — Inpatient Hospital Stay: Payer: Medicare Other

## 2019-08-26 ENCOUNTER — Inpatient Hospital Stay: Payer: Medicare Other | Admitting: Hematology

## 2019-08-26 ENCOUNTER — Telehealth: Payer: Self-pay | Admitting: Hematology

## 2019-08-26 NOTE — Telephone Encounter (Signed)
Scheduled appt per 7/16 sch msg - pt aware of apt

## 2019-09-08 NOTE — Progress Notes (Signed)
Doris Zamora   [phone] [phone]   Clinic Follow up Note   Patient Care Team: Biagio Borg, MD as PCP - General (Internal Medicine) [name] Guys, MD as Attending Physician (Ophthalmology) Jovita Kussmaul, MD as Consulting Physician (General Surgery) Truitt Merle, MD as Consulting Physician (Hematology)  Date of Service:  09/14/2019  CHIEF COMPLAINT: Follow-up of left breast cancer  SUMMARY OF ONCOLOGIC HISTORY: Oncology History Overview Note  Breast cancer, left breast   Staging form: Breast, AJCC 7th Edition     Clinical: T1c, N0 - Unsigned Endometrial cancer   Staging form: Corpus Uteri - Adenosarcoma, AJCC 7th Edition     Clinical: T1c, N0 - Unsigned     Endometrial cancer ([place])  08/12/2005 Initial Diagnosis   Endometrial cancer, T1bN0, s/p hysrectomy    Breast cancer, left breast ([place])  01/13/2014 Imaging   mammogram and US showed 3 masses at 6 o'clock, retroareolar and 2 o'clock area, measuring 0.7-1.7cm.    01/26/2014 Initial Diagnosis   Breast cancer, left breast, multifocal (3 lesions, biopsy showed 2 similar lesions with lobular features, and the third lesion has ductal features).    02/10/2014 -  Anti-estrogen oral therapy   Pt declined surgery, started anastrozole 1 mg once daily started on 02/10/2014. Pt held anastrzole in 08/2016 and restarted 01/05/17.    09/19/2014 Mammogram    mammogram and ultrasound showed interval decrease in the size and density of the 3 previous biopsy sites of malignancy in the left breast.   08/18/2016 Mammogram   IMPRESSION: Unchanged appearance of the left breast with 3 biopsy proven cancers. No evidence of malignancy in the right breast.   08/18/2016 Imaging   Bone Density 08/18/16 T score -1.4, indicating patient is osteopenic   02/16/2017 Mammogram   Diagnostic bilateral mammogram with tomography: IMPRESSION: No significant interval change in the appearance of the 3 sites of biopsy proven cancer in the  left breast. No evidence of right breast malignancy.   04/02/2017 Imaging   Breast MRI  W WO Contrast 04/02/17 IMPRESSION: 1. 2 cm area of very mild non masslike enhancement within the RETROAREOLAR LEFT breast with LEFT nipple retraction compatible with known malignancy in the RETROAREOLAR region. It is difficult to determine interval change from prior studies given different modalities. 2. Biopsy clips within the UPPER-OUTER LEFT breast and LOWER LEFT breast without adjacent abnormal enhancement. 3. No MR evidence of RIGHT breast malignancy.   08/10/2017 Mammogram   08/10/2017 MM Diag Breast TOMO Bilateral IMPRESSION: 1. No significant interval change in the mammographic appearance of 3 sites of biopsy proven left breast cancer. 2. No mammographic evidence of malignancy on the right.      CURRENT THERAPY:  Anastrozole 1 mg once daily, started on 02/10/2014. Pt held anastrozole in 08/2016 and restarted 01/05/17.   INTERVAL HISTORY:  Doris Zamora is here for a follow up left breast cancer. She presents to the clinic with her daughter. She notes she left her hearing aids at home. She notes she is doing well. She is eating adequately with good energy. She was given Tramadol due to her left hip from arthritis. She only uses this as needed. She notes she drinks about 4 ounces of wine a day. She notes she received both her COVID19 vaccine.    REVIEW OF SYSTEMS:   Constitutional: Denies fevers, chills or abnormal weight loss Eyes: Denies blurriness of vision Ears, nose, mouth, throat, and face: Denies mucositis or sore throat Respiratory: Denies cough,  dyspnea or wheezes Cardiovascular: Denies palpitation, chest discomfort or lower extremity swelling Gastrointestinal:  Denies nausea, heartburn or change in bowel habits Skin: Denies abnormal skin rashes Lymphatics: Denies new lymphadenopathy or easy bruising Neurological:Denies numbness, tingling or new weaknesses Behavioral/Psych: Mood is  stable, no new changes  All other systems were reviewed with the patient and are negative.  MEDICAL HISTORY:  Past Medical History:  Diagnosis Date  . ANXIETY   . Arthritis   . Endometrial cancer ([place]) 2007  . Hearing loss   . Heartburn    occ  . Hyperglycemia 04/20/2014  . HYPERLIPIDEMIA   . Hypothyroidism   . INSOMNIA-SLEEP DISORDER-UNSPEC   . Peripheral edema   . Seasonal allergies   . Venous insufficiency   . WEIGHT LOSS     SURGICAL HISTORY: Past Surgical History:  Procedure Laterality Date  . ABDOMINAL HYSTERECTOMY  2007  . BREAST BIOPSY Left 01/26/2014   x3, malignant  . CATARACT EXTRACTION, BILATERAL    . OOPHORECTOMY  2007  . THYROIDECTOMY, PARTIAL  1983  . TOTAL KNEE ARTHROPLASTY Left 11/08/2018   Procedure: LEFT TOTAL KNEE ARTHROPLASTY;  Surgeon: Frederik Pear, MD;  Location: [facility];  Service: Orthopedics;  Laterality: Left;    I have reviewed the social history and family history with the patient and they are unchanged from previous note.  ALLERGIES:  is allergic to synthroid [levothyroxine sodium] and pneumovax [pneumococcal polysaccharide vaccine].  MEDICATIONS:  Current Outpatient Medications  Medication Sig Dispense Refill  . ALPRAZolam (XANAX) 0.5 MG tablet TAKE 1 TABLET(0.5 MG) BY MOUTH DAILY AS NEEDED 30 tablet 5  . anastrozole (ARIMIDEX) 1 MG tablet TAKE 1 TABLET(1 MG) BY MOUTH DAILY 30 tablet 1  . BLACK CURRANT SEED OIL PO Take 1 Dose by mouth daily.    . cetirizine (ZYRTEC) 10 MG tablet TAKE 1 TABLET(10 MG) BY MOUTH DAILY (Patient taking differently: Take 10 mg by mouth daily. ) 30 tablet 5  . cholecalciferol (VITAMIN D) 1000 UNITS tablet Take 1,000 Units by mouth daily.     . Coenzyme Q10 (COQ10) 100 MG CAPS Take 100 mg by mouth daily.    . fluticasone (FLONASE) 50 MCG/ACT nasal spray Place 2 sprays into both nostrils daily. 16 g 2  . Krill Oil 350 MG CAPS Take 350 mg by mouth daily.    . Menthol, Topical Analgesic, (BIOFREEZE EX) Apply 1 patch  topically daily as needed (pain).    Marland Kitchen OVER THE COUNTER MEDICATION Take 1 tablet by mouth daily. Instaflex otc supplement    . potassium chloride (KLOR-CON) 10 MEQ tablet TAKE 1 TABLET BY MOUTH EVERY DAY AS NEEDED FOR WITH USE OF LASIX 30 tablet 5  . Thiamine HCl (VITAMIN B-1) 250 MG tablet Take 250 mg by mouth daily.    . traMADol (ULTRAM) 50 MG tablet Take 1 tablet (50 mg total) by mouth every 6 (six) hours as needed. 30 tablet 0  . vitamin B-12 (CYANOCOBALAMIN) 1000 MCG tablet Take 1,000 mcg by mouth daily.    . vitamin E 400 UNIT capsule Take 400 Units by mouth daily.     No current facility-administered medications for this visit.    PHYSICAL EXAMINATION: ECOG PERFORMANCE STATUS: 0 - Asymptomatic  Vitals:   09/14/19 1328  BP: (!) 150/54  Pulse: 67  Resp: 18  Temp: 97.8 F (36.6 C)  SpO2: 100%   Filed Weights   09/14/19 1328  Weight: 186 lb 6.4 oz (84.6 kg)    GENERAL:alert, no distress and comfortable  SKIN: skin color, texture, turgor are normal, no rashes or significant lesions EYES: normal, Conjunctiva are pink and non-injected, sclera clear  NECK: supple, thyroid normal size, non-tender, without nodularity LYMPH:  no palpable lymphadenopathy in the cervical, axillary  LUNGS: clear to auscultation and percussion with normal breathing effort HEART: regular rate & rhythm and no murmurs and no lower extremity edema ABDOMEN:abdomen soft, non-tender and normal bowel sounds Musculoskeletal:no cyanosis of digits and no clubbing  NEURO: alert & oriented x 3 with fluent speech, no focal motor/sensory deficits BREAST: (+) left breast skin retraction (+) palpable 4x1.5cm (previously 2.5x2cm) mass in the inferior 6 o'clock position of the left breast below the nipple. Right breast exam benign.   LABORATORY DATA:  I have reviewed the data as listed CBC Latest Ref Rng & Units 09/14/2019 08/04/2019 05/20/2019  WBC 4.0 - 10.5 K/uL 5.4 5.6 6.1  Hemoglobin 12.0 - 15.0 g/dL 13.3 13.7 13.2   Hematocrit 36 - 46 % 41.4 41.1 42.2  Platelets 150 - 400 K/uL 193 207.0 239     CMP Latest Ref Rng & Units 09/14/2019 08/04/2019 05/20/2019  Glucose 70 - 99 mg/dL 93 77 86  BUN 8 - 23 mg/dL 17 13 9   Creatinine 0.44 - 1.00 mg/dL 0.80 0.75 0.73  Sodium 135 - 145 mmol/L 139 138 137  Potassium 3.5 - 5.1 mmol/L 3.8 4.7 4.4  Chloride 98 - 111 mmol/L 106 102 106  CO2 22 - 32 mmol/L 27 29 28   Calcium 8.9 - 10.3 mg/dL 9.6 9.7 9.0  Total Protein 6.5 - 8.1 g/dL 8.1 8.5(H) 8.5(H)  Total Bilirubin 0.3 - 1.2 mg/dL 0.5 0.6 0.4  Alkaline Phos 38 - 126 U/L 114 100 100  AST 15 - 41 U/L 89(H) 58(H) 43(H)  ALT 0 - 44 U/L 41 37(H) 42      RADIOGRAPHIC STUDIES: I have personally reviewed the radiological images as listed and agreed with the findings in the report. No results found.   ASSESSMENT & PLAN:  Doris Zamora is a 86 y.o. female with    1. Left breast cancer, 3 synchronized lesions, 2 of them with lobular features and one has ductal features. T1b-1cN0M0, stage I, ER+/PR+/HER2- -She was diagnosed in 01/2014. She has 3 synchronized early stage breast cancer without clinical lymph nodes involvement. I previously discussed that surgical resection, likely mastectomy due to the location of this 3 masses, would be the only curative treatment for her breast cancer. She previously met with Dr. Barry Dienes to discuss this. She has declined surgery repeatedly.  -She started treatment with antiestrogen anastrozole in 02/2014. She notes stopping anastrozole in 08/2016 mistakenly and restarted on 01/05/17 and has been complaint since then. She has been tolerating anastrozole very well, we'll continue indefinitely if no surgery.  -Her 06/08/19 Mammogram shows her left breast is stable. Breast Exam today shows her mass went from 2.5x2cm in 05/2019 to 4x1.5 today (09/14/19).  -Labs reviewed, CBC and CMP WNL except AST 89 . Physical exam shows mostly stable left breast mass.  -She will continue Anastrozole. Repeat L  mammogram in 11/2019. -F/u in 6 months    2. Genetics -She has personal history of endometrial cancer, family history of breast cancer and pancreatic cancer. She declined genetic testing at this point.   3. Arthritis  -She'll continue follow-up with her primary care physician and orthopedic surgeon -She had left knee surgery in 10/2018. She recovered well and ambulating without cane now.  -Her main pain is in her left  hip, mainly with walking. She uses Tramadol as needed.   4. Osteopenia -DEXA scan on 08/18/16 showed T score -1.4, low risk osteopenia -The patient will begin to take calcium and vitamin D daily to support bone health. -Will repeat in 11/2019.    5. Transaminitis  -Her lab previously showed elevated liver enzymes. She had mild intermittent transaminitis in the past -will continue monitoring  -I discussed this can be from alcohol (she drinks wine), medications or small possibility of her cancer. I recommend she stop drinking alcohol.  -Her AST 43 on 05/20/19. Her 09/14/19 labs shows AST 89 normal ALT today. This is likely alcohol related. May repeat lab in 2-3 months and may order US Liver after next lab if worsen.  -I encouraged her to watch for Jaundice.    Plan -Continue Anastrozole, refilled today  -Left Mammogram and DEXA in 11/2019 -lab in 3 months  -Lab and F/u in 6 months    No problem-specific Assessment & Plan notes found for this encounter.   Orders Placed This Encounter  Procedures  . MM DIAG BREAST TOMO UNI LEFT    Standing Status:   Future    Standing Expiration Date:   09/13/2020    Order Specific Question:   Reason for Exam (SYMPTOM  OR DIAGNOSIS REQUIRED)    Answer:   f/u left breast cancer    Order Specific Question:   Preferred imaging location?    Answer:   [facility]  . DG Bone Density    Standing Status:   Future    Standing Expiration Date:   09/13/2020    Order Specific Question:   Reason for Exam (SYMPTOM  OR DIAGNOSIS REQUIRED)     Answer:   screening    Order Specific Question:   Preferred imaging location?    Answer:   [facility]   All questions were answered. The patient knows to call the clinic with any problems, questions or concerns. No barriers to learning was detected. The total time spent in the appointment was 25 minutes.     Truitt Merle, MD 09/14/2019   I, Joslyn Devon, am acting as scribe for Truitt Merle, MD.   I have reviewed the above documentation for accuracy and completeness, and I agree with the above.

## 2019-09-12 ENCOUNTER — Other Ambulatory Visit: Payer: Self-pay | Admitting: Hematology

## 2019-09-12 DIAGNOSIS — C50912 Malignant neoplasm of unspecified site of left female breast: Secondary | ICD-10-CM

## 2019-09-14 ENCOUNTER — Inpatient Hospital Stay (HOSPITAL_BASED_OUTPATIENT_CLINIC_OR_DEPARTMENT_OTHER): Payer: Medicare Other | Admitting: Hematology

## 2019-09-14 ENCOUNTER — Encounter: Payer: Self-pay | Admitting: Hematology

## 2019-09-14 ENCOUNTER — Other Ambulatory Visit: Payer: Self-pay

## 2019-09-14 ENCOUNTER — Inpatient Hospital Stay: Payer: Medicare Other | Attending: Hematology

## 2019-09-14 VITALS — BP 150/54 | HR 67 | Temp 97.8°F | Resp 18 | Ht 66.0 in | Wt 186.4 lb

## 2019-09-14 DIAGNOSIS — E2839 Other primary ovarian failure: Secondary | ICD-10-CM

## 2019-09-14 DIAGNOSIS — C50912 Malignant neoplasm of unspecified site of left female breast: Secondary | ICD-10-CM | POA: Diagnosis present

## 2019-09-14 DIAGNOSIS — Z8542 Personal history of malignant neoplasm of other parts of uterus: Secondary | ICD-10-CM | POA: Insufficient documentation

## 2019-09-14 DIAGNOSIS — M25552 Pain in left hip: Secondary | ICD-10-CM | POA: Diagnosis not present

## 2019-09-14 DIAGNOSIS — Z79899 Other long term (current) drug therapy: Secondary | ICD-10-CM | POA: Insufficient documentation

## 2019-09-14 DIAGNOSIS — Z803 Family history of malignant neoplasm of breast: Secondary | ICD-10-CM | POA: Insufficient documentation

## 2019-09-14 DIAGNOSIS — Z800 Family history of malignant neoplasm of digestive organs: Secondary | ICD-10-CM | POA: Insufficient documentation

## 2019-09-14 DIAGNOSIS — R748 Abnormal levels of other serum enzymes: Secondary | ICD-10-CM | POA: Diagnosis not present

## 2019-09-14 DIAGNOSIS — M8580 Other specified disorders of bone density and structure, unspecified site: Secondary | ICD-10-CM | POA: Diagnosis not present

## 2019-09-14 DIAGNOSIS — M1990 Unspecified osteoarthritis, unspecified site: Secondary | ICD-10-CM | POA: Diagnosis not present

## 2019-09-14 DIAGNOSIS — Z79811 Long term (current) use of aromatase inhibitors: Secondary | ICD-10-CM | POA: Diagnosis not present

## 2019-09-14 DIAGNOSIS — Z170 Estrogen receptor positive status [ER+]: Secondary | ICD-10-CM | POA: Diagnosis not present

## 2019-09-14 LAB — CMP (CANCER CENTER ONLY)
ALT: 41 U/L (ref 0–44)
AST: 89 U/L — ABNORMAL HIGH (ref 15–41)
Albumin: 3.1 g/dL — ABNORMAL LOW (ref 3.5–5.0)
Alkaline Phosphatase: 114 U/L (ref 38–126)
Anion gap: 6 (ref 5–15)
BUN: 17 mg/dL (ref 8–23)
CO2: 27 mmol/L (ref 22–32)
Calcium: 9.6 mg/dL (ref 8.9–10.3)
Chloride: 106 mmol/L (ref 98–111)
Creatinine: 0.8 mg/dL (ref 0.44–1.00)
GFR, Est AFR Am: >60 mL/min (ref >60)
GFR, Estimated: >60 mL/min (ref >60)
Glucose, Bld: 93 mg/dL (ref 70–99)
Potassium: 3.8 mmol/L (ref 3.5–5.1)
Sodium: 139 mmol/L (ref 135–145)
Total Bilirubin: 0.5 mg/dL (ref 0.3–1.2)
Total Protein: 8.1 g/dL (ref 6.5–8.1)

## 2019-09-14 LAB — CBC WITH DIFFERENTIAL (CANCER CENTER ONLY)
Abs Immature Granulocytes: 0.01 10*3/uL (ref 0.00–0.07)
Basophils Absolute: 0.1 10*3/uL (ref 0.0–0.1)
Basophils Relative: 1 %
Eosinophils Absolute: 0 10*3/uL (ref 0.0–0.5)
Eosinophils Relative: 1 %
HCT: 41.4 % (ref 36.0–46.0)
Hemoglobin: 13.3 g/dL (ref 12.0–15.0)
Immature Granulocytes: 0 %
Lymphocytes Relative: 28 %
Lymphs Abs: 1.5 10*3/uL (ref 0.7–4.0)
MCH: 30.1 pg (ref 26.0–34.0)
MCHC: 32.1 g/dL (ref 30.0–36.0)
MCV: 93.7 fL (ref 80.0–100.0)
Monocytes Absolute: 0.6 10*3/uL (ref 0.1–1.0)
Monocytes Relative: 11 %
Neutro Abs: 3.2 10*3/uL (ref 1.7–7.7)
Neutrophils Relative %: 59 %
Platelet Count: 193 10*3/uL (ref 150–400)
RBC: 4.42 MIL/uL (ref 3.87–5.11)
RDW: 14.3 % (ref 11.5–15.5)
WBC Count: 5.4 10*3/uL (ref 4.0–10.5)
nRBC: 0 % (ref 0.0–0.2)

## 2019-09-16 ENCOUNTER — Other Ambulatory Visit: Payer: Self-pay | Admitting: Hematology

## 2019-09-16 DIAGNOSIS — Z170 Estrogen receptor positive status [ER+]: Secondary | ICD-10-CM

## 2019-09-16 DIAGNOSIS — E2839 Other primary ovarian failure: Secondary | ICD-10-CM

## 2019-10-05 ENCOUNTER — Ambulatory Visit (INDEPENDENT_AMBULATORY_CARE_PROVIDER_SITE_OTHER): Payer: Medicare Other | Admitting: Sports Medicine

## 2019-10-05 ENCOUNTER — Other Ambulatory Visit: Payer: Self-pay

## 2019-10-05 ENCOUNTER — Encounter: Payer: Self-pay | Admitting: Sports Medicine

## 2019-10-05 VITALS — BP 144/51 | Ht 66.0 in | Wt 190.0 lb

## 2019-10-05 DIAGNOSIS — M25552 Pain in left hip: Secondary | ICD-10-CM | POA: Diagnosis not present

## 2019-10-05 DIAGNOSIS — M7062 Trochanteric bursitis, left hip: Secondary | ICD-10-CM | POA: Diagnosis not present

## 2019-10-05 MED ORDER — TRAMADOL HCL 50 MG PO TABS: 50.0000 mg | ORAL_TABLET | Freq: Four times a day (QID) | ORAL | 0 refills | Status: AC | PRN

## 2019-10-05 NOTE — Progress Notes (Signed)
   Subjective:    Patient ID: Doris Zamora, female    DOB: [birth year], 86 y.o.   MRN: [id number]  HPI chief complaint: Left hip pain  Patient comes in today complaining of 1 year of intermittent left lateral hip pain.  She denies any trauma.  Pain is worse with ambulation.  Some nighttime pain.  She denies any groin pain.  She is concerned that it may be arthritis.  No radiating pain down her leg.  She denies low back pain.  She is here today with her daughter.  Interim medical history reviewed Medications reviewed Allergies reviewed    Review of Systems As above    Objective:   Physical Exam  Well-developed, well-nourished.  No acute distress.  Sitting comfortably in the exam room.  Left hip: Smooth painless hip range of motion with a negative logroll.  She is tender to palpation directly over the greater trochanteric bursa.  Neurovascularly intact distally.  Walking with a slight limp and the use of a cane.      Assessment & Plan:   Left hip pain secondary to greater trochanteric bursitis   [name] is reassured that her pain is not from arthritis. She will start physical therapy and follow-up with me in 4 weeks.  Pain is not severe enough today to consider a cortisone injection but if her pain persists at  follow-up, then I would reconsider that.  I did however refill her tramadol.  She has taken it in the past with good results and no side effects.  She and her daughter will call me with questions or concerns prior to her follow-up visit in 4 weeks.

## 2019-10-06 ENCOUNTER — Ambulatory Visit: Payer: Medicare Other | Admitting: Sports Medicine

## 2019-11-01 ENCOUNTER — Ambulatory Visit: Payer: Medicare Other | Admitting: Internal Medicine

## 2019-11-01 ENCOUNTER — Telehealth: Payer: Self-pay | Admitting: Internal Medicine

## 2019-11-01 NOTE — Telephone Encounter (Signed)
Ok to make the appt, but please ask daughter to dont hesitate to have her to [facility] or ED for any small worsening of symptoms as severe things can get worse quickly such as covid pneumonia, pulmonary embolus, pneumonia or heart attacks

## 2019-11-01 NOTE — Telephone Encounter (Signed)
Sent to Dr. John. 

## 2019-11-01 NOTE — Telephone Encounter (Signed)
Patient's daughter called and said that her mother has been fatigues lately, laying in bed, and having trouble breathing. Call transferred to [facility].

## 2019-11-01 NOTE — Telephone Encounter (Signed)
[facility] called transferred back to office. Pt has symptoms of fatigue, weakness and SOB. Dr Jenny Reichmann is out of the office but spoke with Dr Alain Marion. MD requested daughter take the patient to urgent care. Daughter still refusing to take patient to urgent care and requested first available appt with DR Jenny Reichmann on 11/15/2019 at 2pm.

## 2019-11-02 NOTE — Telephone Encounter (Signed)
LDVM for pt daughter informing her of Dr. Gwynn Burly note along with his suggestions. Informed pts daughter to please call the office as soon as possible to make an apptmnt to see Dr. Jenny Reichmann as Dr. Jenny Reichmann has OK'd for pt to come into the office to see him.

## 2019-11-03 ENCOUNTER — Ambulatory Visit: Payer: Medicare Other | Admitting: Sports Medicine

## 2019-11-04 ENCOUNTER — Other Ambulatory Visit: Payer: Self-pay | Admitting: Internal Medicine

## 2019-11-04 NOTE — Telephone Encounter (Signed)
Done erx 

## 2019-11-15 ENCOUNTER — Emergency Department (HOSPITAL_COMMUNITY): Payer: Medicare Other

## 2019-11-15 ENCOUNTER — Other Ambulatory Visit: Payer: Self-pay

## 2019-11-15 ENCOUNTER — Encounter (HOSPITAL_COMMUNITY): Payer: Self-pay | Admitting: Emergency Medicine

## 2019-11-15 ENCOUNTER — Ambulatory Visit (INDEPENDENT_AMBULATORY_CARE_PROVIDER_SITE_OTHER): Payer: Medicare Other | Admitting: Internal Medicine

## 2019-11-15 ENCOUNTER — Encounter: Payer: Self-pay | Admitting: Internal Medicine

## 2019-11-15 ENCOUNTER — Inpatient Hospital Stay (HOSPITAL_COMMUNITY)
Admission: EM | Admit: 2019-11-15 | Discharge: 2019-11-18 | DRG: 309 | Disposition: A | Discharge status: Home / Self Care | Payer: Medicare Other | Source: Ambulatory Visit | Attending: Internal Medicine | Admitting: Internal Medicine

## 2019-11-15 DIAGNOSIS — R000 Tachycardia, unspecified: Secondary | ICD-10-CM | POA: Diagnosis not present

## 2019-11-15 DIAGNOSIS — E860 Dehydration: Secondary | ICD-10-CM | POA: Diagnosis present

## 2019-11-15 DIAGNOSIS — Z888 Allergy status to other drugs, medicaments and biological substances status: Secondary | ICD-10-CM

## 2019-11-15 DIAGNOSIS — I471 Supraventricular tachycardia, unspecified: Secondary | ICD-10-CM | POA: Diagnosis present

## 2019-11-15 DIAGNOSIS — Z8542 Personal history of malignant neoplasm of other parts of uterus: Secondary | ICD-10-CM

## 2019-11-15 DIAGNOSIS — I5030 Unspecified diastolic (congestive) heart failure: Secondary | ICD-10-CM | POA: Diagnosis present

## 2019-11-15 DIAGNOSIS — Z79899 Other long term (current) drug therapy: Secondary | ICD-10-CM

## 2019-11-15 DIAGNOSIS — R634 Abnormal weight loss: Secondary | ICD-10-CM | POA: Diagnosis not present

## 2019-11-15 DIAGNOSIS — Z96652 Presence of left artificial knee joint: Secondary | ICD-10-CM | POA: Diagnosis present

## 2019-11-15 DIAGNOSIS — Z9841 Cataract extraction status, right eye: Secondary | ICD-10-CM

## 2019-11-15 DIAGNOSIS — Z9842 Cataract extraction status, left eye: Secondary | ICD-10-CM

## 2019-11-15 DIAGNOSIS — L89526 Pressure-induced deep tissue damage of left ankle: Secondary | ICD-10-CM | POA: Diagnosis present

## 2019-11-15 DIAGNOSIS — L89516 Pressure-induced deep tissue damage of right ankle: Secondary | ICD-10-CM | POA: Diagnosis present

## 2019-11-15 DIAGNOSIS — R945 Abnormal results of liver function studies: Secondary | ICD-10-CM | POA: Diagnosis not present

## 2019-11-15 DIAGNOSIS — Z7189 Other specified counseling: Secondary | ICD-10-CM

## 2019-11-15 DIAGNOSIS — Z833 Family history of diabetes mellitus: Secondary | ICD-10-CM

## 2019-11-15 DIAGNOSIS — C787 Secondary malignant neoplasm of liver and intrahepatic bile duct: Secondary | ICD-10-CM

## 2019-11-15 DIAGNOSIS — Z90710 Acquired absence of both cervix and uterus: Secondary | ICD-10-CM

## 2019-11-15 DIAGNOSIS — Z170 Estrogen receptor positive status [ER+]: Secondary | ICD-10-CM

## 2019-11-15 DIAGNOSIS — E039 Hypothyroidism, unspecified: Secondary | ICD-10-CM | POA: Diagnosis present

## 2019-11-15 DIAGNOSIS — Z515 Encounter for palliative care: Secondary | ICD-10-CM

## 2019-11-15 DIAGNOSIS — R531 Weakness: Secondary | ICD-10-CM

## 2019-11-15 DIAGNOSIS — C50912 Malignant neoplasm of unspecified site of left female breast: Secondary | ICD-10-CM | POA: Diagnosis present

## 2019-11-15 DIAGNOSIS — F419 Anxiety disorder, unspecified: Secondary | ICD-10-CM | POA: Diagnosis present

## 2019-11-15 DIAGNOSIS — H9190 Unspecified hearing loss, unspecified ear: Secondary | ICD-10-CM | POA: Diagnosis present

## 2019-11-15 DIAGNOSIS — Z20822 Contact with and (suspected) exposure to covid-19: Secondary | ICD-10-CM | POA: Diagnosis present

## 2019-11-15 DIAGNOSIS — Z87891 Personal history of nicotine dependence: Secondary | ICD-10-CM

## 2019-11-15 DIAGNOSIS — R7989 Other specified abnormal findings of blood chemistry: Secondary | ICD-10-CM

## 2019-11-15 DIAGNOSIS — Z6826 Body mass index (BMI) 26.0-26.9, adult: Secondary | ICD-10-CM

## 2019-11-15 DIAGNOSIS — E785 Hyperlipidemia, unspecified: Secondary | ICD-10-CM | POA: Diagnosis present

## 2019-11-15 LAB — COMPREHENSIVE METABOLIC PANEL
ALT: 92 U/L — ABNORMAL HIGH (ref 0–44)
AST: 283 U/L — ABNORMAL HIGH (ref 15–41)
Albumin: 3 g/dL — ABNORMAL LOW (ref 3.5–5.0)
Alkaline Phosphatase: 203 U/L — ABNORMAL HIGH (ref 38–126)
Anion gap: 10 (ref 5–15)
BUN: 12 mg/dL (ref 8–23)
CO2: 21 mmol/L — ABNORMAL LOW (ref 22–32)
Calcium: 10.2 mg/dL (ref 8.9–10.3)
Chloride: 103 mmol/L (ref 98–111)
Creatinine, Ser: 0.63 mg/dL (ref 0.44–1.00)
GFR calc non Af Amer: >60 mL/min (ref >60)
Glucose, Bld: 94 mg/dL (ref 70–99)
Potassium: 4 mmol/L (ref 3.5–5.1)
Sodium: 134 mmol/L — ABNORMAL LOW (ref 135–145)
Total Bilirubin: 1.2 mg/dL (ref 0.3–1.2)
Total Protein: 9 g/dL — ABNORMAL HIGH (ref 6.5–8.1)

## 2019-11-15 LAB — CBC
HCT: 47.2 % — ABNORMAL HIGH (ref 36.0–46.0)
Hemoglobin: 15.4 g/dL — ABNORMAL HIGH (ref 12.0–15.0)
MCH: 29.8 pg (ref 26.0–34.0)
MCHC: 32.6 g/dL (ref 30.0–36.0)
MCV: 91.5 fL (ref 80.0–100.0)
Platelets: 246 10*3/uL (ref 150–400)
RBC: 5.16 MIL/uL — ABNORMAL HIGH (ref 3.87–5.11)
RDW: 14.2 % (ref 11.5–15.5)
WBC: 7 10*3/uL (ref 4.0–10.5)
nRBC: 0 % (ref 0.0–0.2)

## 2019-11-15 LAB — I-STAT CHEM 8, ED
BUN: 16 mg/dL (ref 8–23)
Calcium, Ion: 1.26 mmol/L (ref 1.15–1.40)
Chloride: 105 mmol/L (ref 98–111)
Creatinine, Ser: 0.6 mg/dL (ref 0.44–1.00)
Glucose, Bld: 92 mg/dL (ref 70–99)
HCT: 49 % — ABNORMAL HIGH (ref 36.0–46.0)
Hemoglobin: 16.7 g/dL — ABNORMAL HIGH (ref 12.0–15.0)
Potassium: 4.3 mmol/L (ref 3.5–5.1)
Sodium: 138 mmol/L (ref 135–145)
TCO2: 26 mmol/L (ref 22–32)

## 2019-11-15 LAB — LACTIC ACID, PLASMA
Lactic Acid, Venous: 2.3 mmol/L (ref 0.5–1.9)
Lactic Acid, Venous: 1.8 mmol/L (ref 0.5–1.9)

## 2019-11-15 LAB — RESPIRATORY PANEL BY RT PCR (FLU A&B, COVID)
Influenza A by PCR: NEGATIVE
Influenza B by PCR: NEGATIVE
SARS Coronavirus 2 by RT PCR: NEGATIVE

## 2019-11-15 LAB — D-DIMER, QUANTITATIVE: D-Dimer, Quant: 7.45 ug/mL-FEU — ABNORMAL HIGH (ref 0.00–0.50)

## 2019-11-15 LAB — TROPONIN I (HIGH SENSITIVITY)
Troponin I (High Sensitivity): 8 ng/L (ref <18)
Troponin I (High Sensitivity): 6 ng/L (ref <18)

## 2019-11-15 LAB — LIPASE, BLOOD: Lipase: 94 U/L — ABNORMAL HIGH (ref 11–51)

## 2019-11-15 MED ORDER — SODIUM CHLORIDE 0.9 % IV BOLUS
500.0000 mL | Freq: Once | INTRAVENOUS | Status: AC
  Administered 2019-11-15: 500 mL via INTRAVENOUS

## 2019-11-15 MED ORDER — SODIUM CHLORIDE 0.9 % IV SOLN: Freq: Once | INTRAVENOUS | Status: AC

## 2019-11-15 MED ORDER — THIAMINE HCL 100 MG PO TABS
250.0000 mg | ORAL_TABLET | Freq: Every day | ORAL | Status: DC
  Administered 2019-11-16 – 2019-11-18 (×3): 250 mg via ORAL
  Filled 2019-11-15 (×3): qty 3

## 2019-11-15 MED ORDER — ONDANSETRON HCL 4 MG/2ML IJ SOLN: 4.0000 mg | Freq: Four times a day (QID) | INTRAMUSCULAR | Status: DC | PRN

## 2019-11-15 MED ORDER — ACETAMINOPHEN 650 MG RE SUPP: 650.0000 mg | Freq: Four times a day (QID) | RECTAL | Status: DC | PRN

## 2019-11-15 MED ORDER — IOHEXOL 350 MG/ML SOLN
100.0000 mL | Freq: Once | INTRAVENOUS | Status: AC | PRN
  Administered 2019-11-15: 100 mL via INTRAVENOUS

## 2019-11-15 MED ORDER — ENOXAPARIN SODIUM 40 MG/0.4ML ~~LOC~~ SOLN
40.0000 mg | SUBCUTANEOUS | Status: DC
  Administered 2019-11-15 – 2019-11-17 (×2): 40 mg via SUBCUTANEOUS
  Filled 2019-11-15 (×2): qty 0.4

## 2019-11-15 MED ORDER — ONDANSETRON HCL 4 MG PO TABS: 4.0000 mg | ORAL_TABLET | Freq: Four times a day (QID) | ORAL | Status: DC | PRN

## 2019-11-15 MED ORDER — ANASTROZOLE 1 MG PO TABS
1.0000 mg | ORAL_TABLET | Freq: Every day | ORAL | Status: DC
  Administered 2019-11-16 – 2019-11-18 (×3): 1 mg via ORAL
  Filled 2019-11-15 (×3): qty 1

## 2019-11-15 MED ORDER — ACETAMINOPHEN 325 MG PO TABS
650.0000 mg | ORAL_TABLET | Freq: Four times a day (QID) | ORAL | Status: DC | PRN
  Administered 2019-11-16: 650 mg via ORAL
  Filled 2019-11-15: qty 2

## 2019-11-15 MED ORDER — VITAMIN B-12 1000 MCG PO TABS
1000.0000 ug | ORAL_TABLET | Freq: Every day | ORAL | Status: DC
  Administered 2019-11-16 – 2019-11-18 (×3): 1000 ug via ORAL
  Filled 2019-11-15 (×3): qty 1

## 2019-11-15 MED ORDER — ALPRAZOLAM 0.5 MG PO TABS
0.5000 mg | ORAL_TABLET | Freq: Every evening | ORAL | Status: DC | PRN
  Filled 2019-11-15: qty 1

## 2019-11-15 NOTE — Assessment & Plan Note (Signed)
Perhaps pt would need CT abd/pelvis if hospd for tachycardia today

## 2019-11-15 NOTE — ED Provider Notes (Signed)
[facility] EMERGENCY DEPARTMENT Provider Note   CSN: [id number] Arrival date & time: 11/15/19  1527     History Chief Complaint  Patient presents with   Tachycardia    Doris Zamora is a 86 y.o. female.  86 year old female with prior medical history as detailed below presents for evaluation.  Patient was seen by her PCP earlier and then sent to the ED for evaluation of rapid heart rate.  Patient noted to be tachycardic into the 160s-170s on arrival.  She complains of progressive weakness and weight loss over the last 2 to 3 weeks.  The patient reports increased weakness over the last 2 to 3 days to the point where she is having trouble ambulating.  She denies fever.  She does report mild shortness of breath especially with exertion.  She complains of dull achy diffuse chest discomfort with exertion.  She denies prior history of cardiac disease.  She does have a prior distant history of breast cancer.  The history is provided by the patient and medical records.  Illness Location:  Weakness, tachycardia, weight loss Severity:  Moderate Onset quality:  Gradual Duration:  1 week Timing:  Constant Progression:  Worsening Chronicity:  New Associated symptoms: fatigue and shortness of breath   Associated symptoms: no fever        Past Medical History:  Diagnosis Date   ANXIETY    Arthritis    Endometrial cancer ([place]) 2007   Hearing loss    Heartburn    occ   Hyperglycemia 04/20/2014   HYPERLIPIDEMIA    Hypothyroidism    INSOMNIA-SLEEP DISORDER-UNSPEC    Peripheral edema    Seasonal allergies    Venous insufficiency    WEIGHT LOSS     Patient Active Problem List   Diagnosis Date Noted   Tachycardia 11/15/2019   Weight loss 11/15/2019   Abnormal LFTs 11/15/2019   Status post total left knee replacement 11/08/2018   Degenerative arthritis of left knee 11/05/2018   Hearing loss 04/16/2018   Venous insufficiency 03/17/2018    Eustachian tube dysfunction, bilateral 06/06/2017   Bilateral knee pain 09/27/2016   Right shoulder pain 09/27/2016   Leg cramps 02/15/2016   Gait disorder 05/03/2015   Allergic rhinitis 04/20/2014   Hyperglycemia 04/20/2014   Breast cancer, left breast ([place]) 02/08/2014   Left low back pain 09/21/2013   Primary localized osteoarthrosis, lower leg 06/14/2013   Endometrial cancer ([place])    Abnormal CT scan 06/17/2012   Preventative health care 01/11/2012   Obesity (BMI 30.0-34.9) 02/14/2008   Hyperlipidemia 08/11/2007   Anxiety state 08/11/2007   INSOMNIA-SLEEP DISORDER-UNSPEC 08/11/2007    Past Surgical History:  Procedure Laterality Date   ABDOMINAL HYSTERECTOMY  2007   BREAST BIOPSY Left 01/26/2014   x3, malignant   CATARACT EXTRACTION, BILATERAL     OOPHORECTOMY  2007   THYROIDECTOMY, PARTIAL  1983   TOTAL KNEE ARTHROPLASTY Left 11/08/2018   Procedure: LEFT TOTAL KNEE ARTHROPLASTY;  Surgeon: Frederik Pear, MD;  Location: [facility];  Service: Orthopedics;  Laterality: Left;     OB History   No obstetric history on file.     Family History  Problem Relation Age of Onset   Diabetes Mother    Muscular dystrophy Father    Cancer Other 34       breast cancer    Cancer Sister 15       pancreatic cancer     Social History   Tobacco Use  Smoking status: Former Smoker    Years: 50.00    Types: Cigarettes    Quit date: 02/10/1994    Years since quitting: 25.7   Smokeless tobacco: Never Used  Substance Use Topics   Alcohol use: Yes    Alcohol/week: 7.0 standard drinks    Types: 7 Glasses of wine per week    Comment: 4 0z. HS to help her sleep   Drug use: No    Comment: admiited to poat use of marjuana to help her sleep    Home Medications Prior to Admission medications   Medication Sig Start Date End Date Taking? Authorizing Provider  ALPRAZolam (XANAX) 0.5 MG tablet TAKE 1 TABLET(0.5 MG) BY MOUTH DAILY AS NEEDED Patient taking  differently: Take 0.5 mg by mouth at bedtime as needed for anxiety.  11/04/19  Yes Biagio Borg, MD  anastrozole (ARIMIDEX) 1 MG tablet TAKE 1 TABLET(1 MG) BY MOUTH DAILY Patient taking differently: Take 1 mg by mouth daily.  09/12/19  Yes Truitt Merle, MD  BLACK CURRANT SEED OIL PO Take 1 tablet by mouth daily.    Yes [provider]  cetirizine (ZYRTEC) 10 MG tablet TAKE 1 TABLET(10 MG) BY MOUTH DAILY Patient taking differently: Take 10 mg by mouth daily.  08/16/18  Yes Biagio Borg, MD  cholecalciferol (VITAMIN D) 1000 UNITS tablet Take 1,000 Units by mouth daily.    Yes [provider]  Coenzyme Q10 (COQ10) 100 MG CAPS Take 100 mg by mouth daily.   Yes [provider]  fluticasone (FLONASE) 50 MCG/ACT nasal spray Place 2 sprays into both nostrils daily. 06/05/17  Yes Biagio Borg, MD  Krill Oil 350 MG CAPS Take 350 mg by mouth daily.   Yes [provider]  Menthol, Topical Analgesic, (BIOFREEZE EX) Apply 1 patch topically daily as needed (pain).   Yes [provider]  OVER THE COUNTER MEDICATION Take 1 tablet by mouth daily. Instaflex   Yes [provider]  potassium chloride (KLOR-CON) 10 MEQ tablet TAKE 1 TABLET BY MOUTH EVERY DAY AS NEEDED FOR WITH USE OF LASIX Patient taking differently: Take 10 mEq by mouth daily.  06/06/19  Yes Biagio Borg, MD  Thiamine HCl (VITAMIN B-1) 250 MG tablet Take 250 mg by mouth daily.   Yes [provider]  traMADol (ULTRAM) 50 MG tablet Take 1 tablet (50 mg total) by mouth every 6 (six) hours as needed. Patient taking differently: Take 50 mg by mouth every 6 (six) hours as needed for moderate pain.  10/05/19  Yes Draper, Christia Reading R, DO  vitamin B-12 (CYANOCOBALAMIN) 1000 MCG tablet Take 1,000 mcg by mouth daily.   Yes [provider]  vitamin E 400 UNIT capsule Take 400 Units by mouth daily.   Yes [provider]    Allergies    Synthroid [levothyroxine sodium] and Pneumovax  [pneumococcal polysaccharide vaccine]  Review of Systems   Review of Systems  Constitutional: Positive for fatigue. Negative for fever.  Respiratory: Positive for shortness of breath.   All other systems reviewed and are negative.   Physical Exam Updated Vital Signs BP (!) 151/67    Pulse 77    Temp 98 F (36.7 C) (Oral)    Resp (!) 21    SpO2 96%   Physical Exam Vitals and nursing note reviewed.  Constitutional:      Appearance: She is well-developed. She is ill-appearing.  HENT:     Head: Normocephalic and atraumatic.  Mouth/Throat:     Mouth: Mucous membranes are dry.  Eyes:     Conjunctiva/sclera: Conjunctivae normal.     Pupils: Pupils are equal, round, and reactive to light.  Cardiovascular:     Rate and Rhythm: Regular rhythm. Tachycardia present.     Heart sounds: Normal heart sounds.  Pulmonary:     Effort: Pulmonary effort is normal. No respiratory distress.     Breath sounds: Normal breath sounds.  Abdominal:     General: Abdomen is flat. There is no distension.     Palpations: Abdomen is soft.     Tenderness: There is no abdominal tenderness.  Musculoskeletal:        General: No deformity. Normal range of motion.     Cervical back: Normal range of motion and neck supple.  Skin:    General: Skin is warm and dry.  Neurological:     Mental Status: She is alert and oriented to person, place, and time.     ED Results / Procedures / Treatments   Labs (all labs ordered are listed, but only abnormal results are displayed) Labs Reviewed  CBC - Abnormal; Notable for the following components:      Result Value   RBC 5.16 (*)    Hemoglobin 15.4 (*)    HCT 47.2 (*)    All other components within normal limits  COMPREHENSIVE METABOLIC PANEL - Abnormal; Notable for the following components:   Sodium 134 (*)    CO2 21 (*)    Total Protein 9.0 (*)    Albumin 3.0 (*)    AST 283 (*)    ALT 92 (*)    Alkaline Phosphatase 203 (*)    All other components within  normal limits  LIPASE, BLOOD - Abnormal; Notable for the following components:   Lipase 94 (*)    All other components within normal limits  D-DIMER, QUANTITATIVE (NOT AT [facility]) - Abnormal; Notable for the following components:   D-Dimer, Quant 7.45 (*)    All other components within normal limits  LACTIC ACID, PLASMA - Abnormal; Notable for the following components:   Lactic Acid, Venous 2.3 (*)    All other components within normal limits  I-STAT CHEM 8, ED - Abnormal; Notable for the following components:   Hemoglobin 16.7 (*)    HCT 49.0 (*)    All other components within normal limits  CULTURE, BLOOD (ROUTINE X 2)  CULTURE, BLOOD (ROUTINE X 2)  LACTIC ACID, PLASMA  URINALYSIS, ROUTINE W REFLEX MICROSCOPIC  TYPE AND SCREEN  TROPONIN I (HIGH SENSITIVITY)  TROPONIN I (HIGH SENSITIVITY)    EKG EKG Interpretation  Date/Time:  Tuesday November 15 2019 16:25:24 EDT Ventricular Rate:  79 PR Interval:    QRS Duration: 82 QT Interval:  347 QTC Calculation: 398 R Axis:   63 Text Interpretation: Sinus rhythm ST elevation, consider inferior injury Confirmed by Dene Gentry [phone]) on 11/15/2019 4:27:01 PM   Radiology CT Angio Chest PE W and/or Wo Contrast  Result Date: 11/15/2019 CLINICAL DATA:  Chest and abdominal pain with elevated LFTs EXAM: CT ANGIOGRAPHY CHEST CT ABDOMEN AND PELVIS WITH CONTRAST TECHNIQUE: Multidetector CT imaging of the chest was performed using the standard protocol during bolus administration of intravenous contrast. Multiplanar CT image reconstructions and MIPs were obtained to evaluate the vascular anatomy. Multidetector CT imaging of the abdomen and pelvis was performed using the standard protocol during bolus administration of intravenous contrast. CONTRAST:  172mL OMNIPAQUE IOHEXOL 350 MG/ML SOLN COMPARISON:  06/17/2012 FINDINGS: CTA CHEST FINDINGS Cardiovascular: Thoracic aortic calcifications are noted without aneurysmal dilatation. No cardiac enlargement  is seen. Pulmonary artery shows a normal branching pattern. No filling defects are identified. Coronary calcifications are noted. Mediastinum/Nodes: Thoracic inlet is within normal limits. Multiple prominent mediastinal lymph nodes are noted. The largest of these is a conglomeration of nodes adjacent to the carina measuring 1.9 cm in short axis. 19 mm short axis right hilar lymph node is noted as well. The esophagus is within normal limits. Lungs/Pleura: Mild emphysematous changes are noted in the left lung. The lung is otherwise clear. Right lung demonstrates a rounded soft tissue mass lesion measuring 2.1 x 1.5 cm in the right upper lobe best seen on image number 33 of series 5. Some more peripheral smaller nodular densities are noted as well. No sizable effusion or pneumothorax is noted. Musculoskeletal: Degenerative changes of the thoracic spine are seen. No lytic lesions are noted. Review of the MIP images confirms the above findings. CT ABDOMEN and PELVIS FINDINGS Hepatobiliary: Liver demonstrates multiple peripherally enhancing lesions consistent with metastatic disease. There are too numerous to count although an index lesion in the left lobe measures 2.6 cm in dimension best seen on image number 26 of series 5. Index lesion on the right inferiorly measures approximately 2.8 cm in greatest dimension. Gallbladder is well distended with dependent gallstones. No biliary ductal dilatation is noted. Pancreas: Pancreas again demonstrates a cystic lesion in the tail slightly smaller than that seen on the prior exam now measuring 1.6 cm. Spleen: Normal in size without focal abnormality. Adrenals/Urinary Tract: Adrenal glands demonstrate bilateral hypodense lesions stable in appearance from 2014 consistent with adrenal adenomas. Kidneys demonstrate a normal enhancement pattern. No renal calculi or urinary tract obstructive changes are seen. The bladder is partially distended. Stomach/Bowel: Diverticular change of the  colon is noted. No definitive colonic mass is seen. The appendix is not well visualized and likely has been surgically removed. No small bowel or gastric abnormality is seen. Vascular/Lymphatic: Diffuse vascular calcifications are noted in the abdominal aorta and iliac vessels. No aneurysmal dilatation is seen. Some lymphadenopathy is noted in the gastrohepatic ligament and to a greater degree in the portacaval space and peripancreatic region. The largest of these measures approximately 10 mm in short axis. Reproductive: Uterus has been surgically removed. No adnexal mass is noted. Other: No free pelvic fluid is seen. Musculoskeletal: Degenerative changes of lumbar spine are noted. No compression deformities are seen. Review of the MIP images confirms the above findings. IMPRESSION: CTA of the chest: No evidence of pulmonary emboli. 2.1 cm soft tissue mass lesion in the right upper lung with some peripheral nodularity as well as associated hilar and mediastinal adenopathy. Although this may be related to metastatic disease given the findings in the liver, the possibility of a primary pulmonary neoplasm deserves primary consideration. CT of the abdomen and pelvis: Changes consistent with diffuse hepatic metastatic disease. Tissue sampling is recommended for further evaluation. Associated adenopathy in the upper abdomen is noted as well. Cholelithiasis without complicating factors. Pancreatic cystic lesion in the tail slightly smaller than that seen on prior exam. Bilateral adrenal adenomas stable from the prior study. Diverticulosis without diverticulitis. Electronically Signed   By: Inez Catalina M.D.   On: 11/15/2019 18:45   CT ABDOMEN PELVIS W CONTRAST  Result Date: 11/15/2019 CLINICAL DATA:  Chest and abdominal pain with elevated LFTs EXAM: CT ANGIOGRAPHY CHEST CT ABDOMEN AND PELVIS WITH CONTRAST TECHNIQUE: Multidetector CT imaging of the chest  was performed using the standard protocol during bolus  administration of intravenous contrast. Multiplanar CT image reconstructions and MIPs were obtained to evaluate the vascular anatomy. Multidetector CT imaging of the abdomen and pelvis was performed using the standard protocol during bolus administration of intravenous contrast. CONTRAST:  11mL OMNIPAQUE IOHEXOL 350 MG/ML SOLN COMPARISON:  06/17/2012 FINDINGS: CTA CHEST FINDINGS Cardiovascular: Thoracic aortic calcifications are noted without aneurysmal dilatation. No cardiac enlargement is seen. Pulmonary artery shows a normal branching pattern. No filling defects are identified. Coronary calcifications are noted. Mediastinum/Nodes: Thoracic inlet is within normal limits. Multiple prominent mediastinal lymph nodes are noted. The largest of these is a conglomeration of nodes adjacent to the carina measuring 1.9 cm in short axis. 19 mm short axis right hilar lymph node is noted as well. The esophagus is within normal limits. Lungs/Pleura: Mild emphysematous changes are noted in the left lung. The lung is otherwise clear. Right lung demonstrates a rounded soft tissue mass lesion measuring 2.1 x 1.5 cm in the right upper lobe best seen on image number 33 of series 5. Some more peripheral smaller nodular densities are noted as well. No sizable effusion or pneumothorax is noted. Musculoskeletal: Degenerative changes of the thoracic spine are seen. No lytic lesions are noted. Review of the MIP images confirms the above findings. CT ABDOMEN and PELVIS FINDINGS Hepatobiliary: Liver demonstrates multiple peripherally enhancing lesions consistent with metastatic disease. There are too numerous to count although an index lesion in the left lobe measures 2.6 cm in dimension best seen on image number 26 of series 5. Index lesion on the right inferiorly measures approximately 2.8 cm in greatest dimension. Gallbladder is well distended with dependent gallstones. No biliary ductal dilatation is noted. Pancreas: Pancreas again  demonstrates a cystic lesion in the tail slightly smaller than that seen on the prior exam now measuring 1.6 cm. Spleen: Normal in size without focal abnormality. Adrenals/Urinary Tract: Adrenal glands demonstrate bilateral hypodense lesions stable in appearance from 2014 consistent with adrenal adenomas. Kidneys demonstrate a normal enhancement pattern. No renal calculi or urinary tract obstructive changes are seen. The bladder is partially distended. Stomach/Bowel: Diverticular change of the colon is noted. No definitive colonic mass is seen. The appendix is not well visualized and likely has been surgically removed. No small bowel or gastric abnormality is seen. Vascular/Lymphatic: Diffuse vascular calcifications are noted in the abdominal aorta and iliac vessels. No aneurysmal dilatation is seen. Some lymphadenopathy is noted in the gastrohepatic ligament and to a greater degree in the portacaval space and peripancreatic region. The largest of these measures approximately 10 mm in short axis. Reproductive: Uterus has been surgically removed. No adnexal mass is noted. Other: No free pelvic fluid is seen. Musculoskeletal: Degenerative changes of lumbar spine are noted. No compression deformities are seen. Review of the MIP images confirms the above findings. IMPRESSION: CTA of the chest: No evidence of pulmonary emboli. 2.1 cm soft tissue mass lesion in the right upper lung with some peripheral nodularity as well as associated hilar and mediastinal adenopathy. Although this may be related to metastatic disease given the findings in the liver, the possibility of a primary pulmonary neoplasm deserves primary consideration. CT of the abdomen and pelvis: Changes consistent with diffuse hepatic metastatic disease. Tissue sampling is recommended for further evaluation. Associated adenopathy in the upper abdomen is noted as well. Cholelithiasis without complicating factors. Pancreatic cystic lesion in the tail slightly  smaller than that seen on prior exam. Bilateral adrenal adenomas stable from the prior  study. Diverticulosis without diverticulitis. Electronically Signed   By: Inez Catalina M.D.   On: 11/15/2019 18:45   DG Chest Port 1 View  Result Date: 11/15/2019 CLINICAL DATA:  Dyspnea and tachycardia EXAM: PORTABLE CHEST 1 VIEW COMPARISON:  11/03/2018 FINDINGS: Cardiac shadow is stable. Aortic calcifications are again seen. Lungs are well aerated bilaterally. No focal infiltrate or sizable effusion is seen. No bony abnormality is noted. IMPRESSION: No active disease. Electronically Signed   By: Inez Catalina M.D.   On: 11/15/2019 16:28    Procedures Procedures (including critical care time)  Medications Ordered in ED Medications  sodium chloride 0.9 % bolus 500 mL (0 mLs Intravenous Stopped 11/15/19 1803)  0.9 %  sodium chloride infusion ( Intravenous New Bag/Given 11/15/19 1845)  iohexol (OMNIPAQUE) 350 MG/ML injection 100 mL (100 mLs Intravenous Contrast Given 11/15/19 1811)    ED Course  I have reviewed the triage vital signs and the nursing notes.  Pertinent labs & imaging results that were available during my care of the patient were reviewed by me and considered in my medical decision making (see chart for details).    MDM Rules/Calculators/A&P                         MDM  Screen complete  Doris Zamora was evaluated in Emergency Department on 11/15/2019 for the symptoms described in the history of present illness. She was evaluated in the context of the global COVID-19 pandemic, which necessitated consideration that the patient might be at risk for infection with the SARS-CoV-2 virus that causes COVID-19. Institutional protocols and algorithms that pertain to the evaluation of patients at risk for COVID-19 are in a state of rapid change based on information released by regulatory bodies including the CDC and federal and state organizations. These policies and algorithms were followed during the  patient's care in the ED.  Patient is presenting for evaluation of recent weight loss, fatigue, and tachycardia.  Patient is noted to be tachycardic into the 170s on arrival.  Blood pressure is maintained.  Shortly after initiation of treatment in the ED she appears to spontaneously revert from a presumed SVT to a normal sinus rhythm.  Work-up additionally demonstrates evidence of mild to moderate dehydration.  Patient also with findings of likely malignant disease in the lung and liver on CT.  Patient would benefit from admission for hydration and further work-up and treatment.  Hospitalist service is aware of case and will evaluate for admission.   Final Clinical Impression(s) / ED Diagnoses Final diagnoses:  SVT (supraventricular tachycardia) ([place])  Tachycardia    Rx / DC Orders ED Discharge Orders    None       Valarie Merino, MD 11/15/19 1926

## 2019-11-15 NOTE — ED Notes (Addendum)
Pt resting comfortably

## 2019-11-15 NOTE — ED Triage Notes (Signed)
Patient sent here from PCP with complaints of SOB, chest pressure, and dizziness starting today. Pt heart rate between 165-175. Complaints of recent weight loss of 25lbs in six weeks. Hx breast cancer.

## 2019-11-15 NOTE — Assessment & Plan Note (Addendum)
ecg reviewed - has what appears to be sinus tachy in high 160/s, pt with at least mild symptoms, now for referral to [facility] ED; daughter given copy of ecg  I spent 31 minutes in preparing to see the patient by review of recent labs, imaging and procedures, obtaining and reviewing separately obtained history, communicating with the patient and family or caregiver, ordering medications, tests or procedures, and documenting clinical information in the EHR including the differential Dx, treatment, and any further evaluation and other management of tachycardia, wt loss, elevated lft

## 2019-11-15 NOTE — Addendum Note (Signed)
Addended by: Marijean Heath R on: 11/15/2019 02:53 PM   Modules accepted: Orders

## 2019-11-15 NOTE — ED Notes (Signed)
Lactic 2.3

## 2019-11-15 NOTE — Assessment & Plan Note (Signed)
Also noted today, etiology unclear, I am wondering about second malignancy

## 2019-11-15 NOTE — Progress Notes (Signed)
Subjective:    Patient ID: Doris Zamora, female    DOB: 07/01/1930, 86 y.o.   MRN: [id number]  HPI  Pt seen briefly and nurse lets me know pt has tachycardia; pt here with daughter, who lets [place] know pt has had recent (not sure how long) sob, chest discomfort, fatigue and weakness, maybe some dizziness as well.  Of note pt has hx of breast ca followed per oncology, but also very rapid very recent wt loss about 25 lbs in last 5-6 wks, with low appetite. Wt Readings from Last 3 Encounters:  10/05/19 190 lb (86.2 kg)  09/14/19 186 lb 6.4 oz (84.6 kg)  08/04/19 187 lb (84.8 kg)  Recent aug 2021 labs show mild elevated AST.  Past Medical History:  Diagnosis Date  . ANXIETY   . Arthritis   . Endometrial cancer ([place]) 2007  . Hearing loss   . Heartburn    occ  . Hyperglycemia 04/20/2014  . HYPERLIPIDEMIA   . Hypothyroidism   . INSOMNIA-SLEEP DISORDER-UNSPEC   . Peripheral edema   . Seasonal allergies   . Venous insufficiency   . WEIGHT LOSS    Past Surgical History:  Procedure Laterality Date  . ABDOMINAL HYSTERECTOMY  2007  . BREAST BIOPSY Left 01/26/2014   x3, malignant  . CATARACT EXTRACTION, BILATERAL    . OOPHORECTOMY  2007  . THYROIDECTOMY, PARTIAL  1983  . TOTAL KNEE ARTHROPLASTY Left 11/08/2018   Procedure: LEFT TOTAL KNEE ARTHROPLASTY;  Surgeon: Frederik Pear, MD;  Location: [facility];  Service: Orthopedics;  Laterality: Left;    reports that she quit smoking about 25 years ago. Her smoking use included cigarettes. She quit after 50.00 years of use. She has never used smokeless tobacco. She reports current alcohol use of about 7.0 standard drinks of alcohol per week. She reports that she does not use drugs. family history includes Cancer (age of onset: 24) in an other family member; Cancer (age of onset: 63) in her sister; Diabetes in her mother; Muscular dystrophy in her father. Allergies  Allergen Reactions  . Synthroid [Levothyroxine Sodium] Swelling    Happened yrs ago   . Pneumovax [Pneumococcal Polysaccharide Vaccine] Swelling   Current Outpatient Medications on File Prior to Visit  Medication Sig Dispense Refill  . ALPRAZolam (XANAX) 0.5 MG tablet TAKE 1 TABLET(0.5 MG) BY MOUTH DAILY AS NEEDED 30 tablet 5  . anastrozole (ARIMIDEX) 1 MG tablet TAKE 1 TABLET(1 MG) BY MOUTH DAILY 30 tablet 1  . BLACK CURRANT SEED OIL PO Take 1 Dose by mouth daily.    . cetirizine (ZYRTEC) 10 MG tablet TAKE 1 TABLET(10 MG) BY MOUTH DAILY (Patient taking differently: Take 10 mg by mouth daily. ) 30 tablet 5  . cholecalciferol (VITAMIN D) 1000 UNITS tablet Take 1,000 Units by mouth daily.     . Coenzyme Q10 (COQ10) 100 MG CAPS Take 100 mg by mouth daily.    . fluticasone (FLONASE) 50 MCG/ACT nasal spray Place 2 sprays into both nostrils daily. 16 g 2  . Krill Oil 350 MG CAPS Take 350 mg by mouth daily.    . Menthol, Topical Analgesic, (BIOFREEZE EX) Apply 1 patch topically daily as needed (pain).    Marland Kitchen OVER THE COUNTER MEDICATION Take 1 tablet by mouth daily. Instaflex otc supplement    . potassium chloride (KLOR-CON) 10 MEQ tablet TAKE 1 TABLET BY MOUTH EVERY DAY AS NEEDED FOR WITH USE OF LASIX 30 tablet 5  . Thiamine HCl (VITAMIN B-1)  250 MG tablet Take 250 mg by mouth daily.    . traMADol (ULTRAM) 50 MG tablet Take 1 tablet (50 mg total) by mouth every 6 (six) hours as needed. 30 tablet 0  . vitamin B-12 (CYANOCOBALAMIN) 1000 MCG tablet Take 1,000 mcg by mouth daily.    . vitamin E 400 UNIT capsule Take 400 Units by mouth daily.     No current facility-administered medications on file prior to visit.   Review of Systems All otherwise neg per pt     Objective:   Physical Exam BP (!) 160/80 (BP Location: Left Arm, Patient Position: Sitting, Cuff Size: Large)   Pulse (!) 173   Temp 97.8 F (36.6 C) (Oral)   SpO2 98%  VS noted,  Constitutional: Pt appears in NAD HENT: Head: NCAT. Not ill appearing Right Ear: External ear normal.  Left Ear: External ear normal.   Eyes: . Pupils are equal, round, and reactive to light. Conjunctivae and EOM are normal Nose: without d/c or deformity Neck: Neck supple. Gross normal ROM Cardiovascular: Normal rate and regular rhythm.  but tachycardic Pulmonary/Chest: Effort normal and breath sounds without rales or wheezing.  Abd:  Soft, NT, ND, + BS, no organomegaly Neurological: Pt is alert. At baseline orientation, motor grossly intact Skin: Skin is warm. No rashes, other new lesions, no LE edema Psychiatric: Pt behavior is normal without agitation  Lab Results  Component Value Date   WBC 5.4 09/14/2019   HGB 13.3 09/14/2019   HCT 41.4 09/14/2019   PLT 193 09/14/2019   GLUCOSE 93 09/14/2019   CHOL 180 08/04/2019   TRIG 61.0 08/04/2019   HDL 64.50 08/04/2019   LDLDIRECT 129.4 02/14/2008   LDLCALC 104 (H) 08/04/2019   ALT 41 09/14/2019   AST 89 (H) 09/14/2019   NA 139 09/14/2019   K 3.8 09/14/2019   CL 106 09/14/2019   CREATININE 0.80 09/14/2019   BUN 17 09/14/2019   CO2 27 09/14/2019   TSH 1.64 08/04/2019   INR 1.0 11/03/2018   HGBA1C 5.3 08/04/2019   ecg today I have personally intepreted - sinus tach/SVT with non specific ST changes     Assessment & Plan:

## 2019-11-15 NOTE — H&P (Signed)
History and Physical    [name]:[id number] DOB: 02/08/1931 DOA: 11/15/2019  PCP: Biagio Borg, MD  Patient coming from: Home.  Chief Complaint: Elevated heart rate.  HPI: Doris Zamora is a 86 y.o. female with history of breast cancer on anastrozole had declined mastectomy previously followed by Dr. Burr Medico with previous history of endometrial carcinoma status post hysterectomy has been having poor appetite and not eating well for the last 2 weeks and has lost considerable amount of weight over the last 6 weeks.  Patient finds intensely weak and fatigued with exertion.  Patient had gone to her PCP today for checkup and was found to be tachycardic and was referred to the ER.  Patient denies any chest pain or shortness of breath.  ED Course: In the ER patient was found to be in SVT which converted back to sinus rhythm without any intervention.  Lab work showed significantly elevated D-dimer at 7.45 for which patient had a CT scan of the chest and abdomen done which was right upper lung lesion with adenopathy and also liver lesion concerning for metastasis.  Patient also was given fluids given that patient clinically looks dehydrated.  Patient's LFTs were elevated with AST at 283 ALT at 92.  Patient admitted for further observation.  Review of Systems: As per HPI, rest all negative.   Past Medical History:  Diagnosis Date  . ANXIETY   . Arthritis   . Endometrial cancer ([place]) 2007  . Hearing loss   . Heartburn    occ  . Hyperglycemia 04/20/2014  . HYPERLIPIDEMIA   . Hypothyroidism   . INSOMNIA-SLEEP DISORDER-UNSPEC   . Peripheral edema   . Seasonal allergies   . Venous insufficiency   . WEIGHT LOSS     Past Surgical History:  Procedure Laterality Date  . ABDOMINAL HYSTERECTOMY  2007  . BREAST BIOPSY Left 01/26/2014   x3, malignant  . CATARACT EXTRACTION, BILATERAL    . OOPHORECTOMY  2007  . THYROIDECTOMY, PARTIAL  1983  . TOTAL KNEE ARTHROPLASTY Left 11/08/2018    Procedure: LEFT TOTAL KNEE ARTHROPLASTY;  Surgeon: Frederik Pear, MD;  Location: [facility];  Service: Orthopedics;  Laterality: Left;     reports that she quit smoking about 25 years ago. Her smoking use included cigarettes. She quit after 50.00 years of use. She has never used smokeless tobacco. She reports current alcohol use of about 7.0 standard drinks of alcohol per week. She reports that she does not use drugs.  Allergies  Allergen Reactions  . Synthroid [Levothyroxine Sodium] Swelling    Happened yrs ago  . Pneumovax [Pneumococcal Polysaccharide Vaccine] Swelling    Family History  Problem Relation Age of Onset  . Diabetes Mother   . Muscular dystrophy Father   . Cancer Other 40       breast cancer   . Cancer Sister 64       pancreatic cancer     Prior to Admission medications   Medication Sig Start Date End Date Taking? Authorizing Provider  ALPRAZolam (XANAX) 0.5 MG tablet TAKE 1 TABLET(0.5 MG) BY MOUTH DAILY AS NEEDED Patient taking differently: Take 0.5 mg by mouth at bedtime as needed for anxiety.  11/04/19  Yes Biagio Borg, MD  anastrozole (ARIMIDEX) 1 MG tablet TAKE 1 TABLET(1 MG) BY MOUTH DAILY Patient taking differently: Take 1 mg by mouth daily.  09/12/19  Yes Truitt Merle, MD  BLACK CURRANT SEED OIL PO Take 1 tablet by mouth daily.  Yes [provider]  cetirizine (ZYRTEC) 10 MG tablet TAKE 1 TABLET(10 MG) BY MOUTH DAILY Patient taking differently: Take 10 mg by mouth daily.  08/16/18  Yes Biagio Borg, MD  cholecalciferol (VITAMIN D) 1000 UNITS tablet Take 1,000 Units by mouth daily.    Yes [provider]  Coenzyme Q10 (COQ10) 100 MG CAPS Take 100 mg by mouth daily.   Yes [provider]  fluticasone (FLONASE) 50 MCG/ACT nasal spray Place 2 sprays into both nostrils daily. 06/05/17  Yes Biagio Borg, MD  Krill Oil 350 MG CAPS Take 350 mg by mouth daily.   Yes [provider]  Menthol, Topical Analgesic, (BIOFREEZE EX) Apply 1 patch  topically daily as needed (pain).   Yes [provider]  OVER THE COUNTER MEDICATION Take 1 tablet by mouth daily. Instaflex   Yes [provider]  potassium chloride (KLOR-CON) 10 MEQ tablet TAKE 1 TABLET BY MOUTH EVERY DAY AS NEEDED FOR WITH USE OF LASIX Patient taking differently: Take 10 mEq by mouth daily.  06/06/19  Yes Biagio Borg, MD  Thiamine HCl (VITAMIN B-1) 250 MG tablet Take 250 mg by mouth daily.   Yes [provider]  traMADol (ULTRAM) 50 MG tablet Take 1 tablet (50 mg total) by mouth every 6 (six) hours as needed. Patient taking differently: Take 50 mg by mouth every 6 (six) hours as needed for moderate pain.  10/05/19  Yes Draper, Christia Reading R, DO  vitamin B-12 (CYANOCOBALAMIN) 1000 MCG tablet Take 1,000 mcg by mouth daily.   Yes [provider]  vitamin E 400 UNIT capsule Take 400 Units by mouth daily.   Yes [provider]    Physical Exam: Constitutional: Moderately built and nourished. Vitals:   11/15/19 1620 11/15/19 1630 11/15/19 1700 11/15/19 2130  BP:  136/64 (!) 151/67 (!) 138/53  Pulse: 81 78 77 78  Resp:      Temp:      TempSrc:      SpO2: 98% 97% 96% 99%   Eyes: Anicteric no pallor. ENMT: No discharge from the ears eyes nose or mouth. Neck: No mass felt.  No neck rigidity. Respiratory: No rhonchi or crepitations. Cardiovascular: S1-S2 heard. Abdomen: Soft nontender bowel sounds present. Musculoskeletal: No edema. Skin: No rash. Neurologic: Alert awake oriented to time place and person.  Moves all extremities. Psychiatric: Appears normal.  Normal affect.   Labs on Admission: I have personally reviewed following labs and imaging studies  CBC: Recent Labs  Lab 11/15/19 1546 11/15/19 1617  WBC 7.0  --   HGB 15.4* 16.7*  HCT 47.2* 49.0*  MCV 91.5  --   PLT 246  --    Basic Metabolic Panel: Recent Labs  Lab 11/15/19 1555 11/15/19 1617  NA 134* 138  K 4.0 4.3  CL 103 105  CO2 21*  --   GLUCOSE 94  92  BUN 12 16  CREATININE 0.63 0.60  CALCIUM 10.2  --    GFR: CrCl cannot be calculated (Unknown ideal weight.). Liver Function Tests: Recent Labs  Lab 11/15/19 1555  AST 283*  ALT 92*  ALKPHOS 203*  BILITOT 1.2  PROT 9.0*  ALBUMIN 3.0*   Recent Labs  Lab 11/15/19 1555  LIPASE 94*   No results for input(s): AMMONIA in the last 168 hours. Coagulation Profile: No results for input(s): INR, PROTIME in the last 168 hours. Cardiac Enzymes: No results for input(s): CKTOTAL, CKMB, CKMBINDEX, TROPONINI in the last 168  hours. BNP (last 3 results) No results for input(s): PROBNP in the last 8760 hours. HbA1C: No results for input(s): HGBA1C in the last 72 hours. CBG: No results for input(s): GLUCAP in the last 168 hours. Lipid Profile: No results for input(s): CHOL, HDL, LDLCALC, TRIG, CHOLHDL, LDLDIRECT in the last 72 hours. Thyroid Function Tests: No results for input(s): TSH, T4TOTAL, FREET4, T3FREE, THYROIDAB in the last 72 hours. Anemia Panel: No results for input(s): VITAMINB12, FOLATE, FERRITIN, TIBC, IRON, RETICCTPCT in the last 72 hours. Urine analysis:    Component Value Date/Time   COLORURINE YELLOW 08/04/2019 1513   APPEARANCEUR CLEAR 08/04/2019 1513   LABSPEC 1.025 08/04/2019 1513   PHURINE 6.5 08/04/2019 1513   GLUCOSEU NEGATIVE 08/04/2019 1513   HGBUR NEGATIVE 08/04/2019 1513   BILIRUBINUR NEGATIVE 08/04/2019 1513   KETONESUR NEGATIVE 08/04/2019 1513   PROTEINUR NEGATIVE 11/03/2018 1400   UROBILINOGEN 1.0 08/04/2019 1513   NITRITE NEGATIVE 08/04/2019 1513   LEUKOCYTESUR NEGATIVE 08/04/2019 1513   Sepsis Labs: @LABRCNTIP (procalcitonin:4,lacticidven:4) )No results found for this or any previous visit (from the past 240 hour(s)).   Radiological Exams on Admission: CT Angio Chest PE W and/or Wo Contrast  Result Date: 11/15/2019 CLINICAL DATA:  Chest and abdominal pain with elevated LFTs EXAM: CT ANGIOGRAPHY CHEST CT ABDOMEN AND PELVIS WITH CONTRAST  TECHNIQUE: Multidetector CT imaging of the chest was performed using the standard protocol during bolus administration of intravenous contrast. Multiplanar CT image reconstructions and MIPs were obtained to evaluate the vascular anatomy. Multidetector CT imaging of the abdomen and pelvis was performed using the standard protocol during bolus administration of intravenous contrast. CONTRAST:  159mL OMNIPAQUE IOHEXOL 350 MG/ML SOLN COMPARISON:  06/17/2012 FINDINGS: CTA CHEST FINDINGS Cardiovascular: Thoracic aortic calcifications are noted without aneurysmal dilatation. No cardiac enlargement is seen. Pulmonary artery shows a normal branching pattern. No filling defects are identified. Coronary calcifications are noted. Mediastinum/Nodes: Thoracic inlet is within normal limits. Multiple prominent mediastinal lymph nodes are noted. The largest of these is a conglomeration of nodes adjacent to the carina measuring 1.9 cm in short axis. 19 mm short axis right hilar lymph node is noted as well. The esophagus is within normal limits. Lungs/Pleura: Mild emphysematous changes are noted in the left lung. The lung is otherwise clear. Right lung demonstrates a rounded soft tissue mass lesion measuring 2.1 x 1.5 cm in the right upper lobe best seen on image number 33 of series 5. Some more peripheral smaller nodular densities are noted as well. No sizable effusion or pneumothorax is noted. Musculoskeletal: Degenerative changes of the thoracic spine are seen. No lytic lesions are noted. Review of the MIP images confirms the above findings. CT ABDOMEN and PELVIS FINDINGS Hepatobiliary: Liver demonstrates multiple peripherally enhancing lesions consistent with metastatic disease. There are too numerous to count although an index lesion in the left lobe measures 2.6 cm in dimension best seen on image number 26 of series 5. Index lesion on the right inferiorly measures approximately 2.8 cm in greatest dimension. Gallbladder is well  distended with dependent gallstones. No biliary ductal dilatation is noted. Pancreas: Pancreas again demonstrates a cystic lesion in the tail slightly smaller than that seen on the prior exam now measuring 1.6 cm. Spleen: Normal in size without focal abnormality. Adrenals/Urinary Tract: Adrenal glands demonstrate bilateral hypodense lesions stable in appearance from 2014 consistent with adrenal adenomas. Kidneys demonstrate a normal enhancement pattern. No renal calculi or urinary tract obstructive changes are seen. The bladder is partially distended. Stomach/Bowel: Diverticular change of  the colon is noted. No definitive colonic mass is seen. The appendix is not well visualized and likely has been surgically removed. No small bowel or gastric abnormality is seen. Vascular/Lymphatic: Diffuse vascular calcifications are noted in the abdominal aorta and iliac vessels. No aneurysmal dilatation is seen. Some lymphadenopathy is noted in the gastrohepatic ligament and to a greater degree in the portacaval space and peripancreatic region. The largest of these measures approximately 10 mm in short axis. Reproductive: Uterus has been surgically removed. No adnexal mass is noted. Other: No free pelvic fluid is seen. Musculoskeletal: Degenerative changes of lumbar spine are noted. No compression deformities are seen. Review of the MIP images confirms the above findings. IMPRESSION: CTA of the chest: No evidence of pulmonary emboli. 2.1 cm soft tissue mass lesion in the right upper lung with some peripheral nodularity as well as associated hilar and mediastinal adenopathy. Although this may be related to metastatic disease given the findings in the liver, the possibility of a primary pulmonary neoplasm deserves primary consideration. CT of the abdomen and pelvis: Changes consistent with diffuse hepatic metastatic disease. Tissue sampling is recommended for further evaluation. Associated adenopathy in the upper abdomen is noted  as well. Cholelithiasis without complicating factors. Pancreatic cystic lesion in the tail slightly smaller than that seen on prior exam. Bilateral adrenal adenomas stable from the prior study. Diverticulosis without diverticulitis. Electronically Signed   By: Inez Catalina M.D.   On: 11/15/2019 18:45   CT ABDOMEN PELVIS W CONTRAST  Result Date: 11/15/2019 CLINICAL DATA:  Chest and abdominal pain with elevated LFTs EXAM: CT ANGIOGRAPHY CHEST CT ABDOMEN AND PELVIS WITH CONTRAST TECHNIQUE: Multidetector CT imaging of the chest was performed using the standard protocol during bolus administration of intravenous contrast. Multiplanar CT image reconstructions and MIPs were obtained to evaluate the vascular anatomy. Multidetector CT imaging of the abdomen and pelvis was performed using the standard protocol during bolus administration of intravenous contrast. CONTRAST:  160mL OMNIPAQUE IOHEXOL 350 MG/ML SOLN COMPARISON:  06/17/2012 FINDINGS: CTA CHEST FINDINGS Cardiovascular: Thoracic aortic calcifications are noted without aneurysmal dilatation. No cardiac enlargement is seen. Pulmonary artery shows a normal branching pattern. No filling defects are identified. Coronary calcifications are noted. Mediastinum/Nodes: Thoracic inlet is within normal limits. Multiple prominent mediastinal lymph nodes are noted. The largest of these is a conglomeration of nodes adjacent to the carina measuring 1.9 cm in short axis. 19 mm short axis right hilar lymph node is noted as well. The esophagus is within normal limits. Lungs/Pleura: Mild emphysematous changes are noted in the left lung. The lung is otherwise clear. Right lung demonstrates a rounded soft tissue mass lesion measuring 2.1 x 1.5 cm in the right upper lobe best seen on image number 33 of series 5. Some more peripheral smaller nodular densities are noted as well. No sizable effusion or pneumothorax is noted. Musculoskeletal: Degenerative changes of the thoracic spine are  seen. No lytic lesions are noted. Review of the MIP images confirms the above findings. CT ABDOMEN and PELVIS FINDINGS Hepatobiliary: Liver demonstrates multiple peripherally enhancing lesions consistent with metastatic disease. There are too numerous to count although an index lesion in the left lobe measures 2.6 cm in dimension best seen on image number 26 of series 5. Index lesion on the right inferiorly measures approximately 2.8 cm in greatest dimension. Gallbladder is well distended with dependent gallstones. No biliary ductal dilatation is noted. Pancreas: Pancreas again demonstrates a cystic lesion in the tail slightly smaller than that seen on the  prior exam now measuring 1.6 cm. Spleen: Normal in size without focal abnormality. Adrenals/Urinary Tract: Adrenal glands demonstrate bilateral hypodense lesions stable in appearance from 2014 consistent with adrenal adenomas. Kidneys demonstrate a normal enhancement pattern. No renal calculi or urinary tract obstructive changes are seen. The bladder is partially distended. Stomach/Bowel: Diverticular change of the colon is noted. No definitive colonic mass is seen. The appendix is not well visualized and likely has been surgically removed. No small bowel or gastric abnormality is seen. Vascular/Lymphatic: Diffuse vascular calcifications are noted in the abdominal aorta and iliac vessels. No aneurysmal dilatation is seen. Some lymphadenopathy is noted in the gastrohepatic ligament and to a greater degree in the portacaval space and peripancreatic region. The largest of these measures approximately 10 mm in short axis. Reproductive: Uterus has been surgically removed. No adnexal mass is noted. Other: No free pelvic fluid is seen. Musculoskeletal: Degenerative changes of lumbar spine are noted. No compression deformities are seen. Review of the MIP images confirms the above findings. IMPRESSION: CTA of the chest: No evidence of pulmonary emboli. 2.1 cm soft tissue  mass lesion in the right upper lung with some peripheral nodularity as well as associated hilar and mediastinal adenopathy. Although this may be related to metastatic disease given the findings in the liver, the possibility of a primary pulmonary neoplasm deserves primary consideration. CT of the abdomen and pelvis: Changes consistent with diffuse hepatic metastatic disease. Tissue sampling is recommended for further evaluation. Associated adenopathy in the upper abdomen is noted as well. Cholelithiasis without complicating factors. Pancreatic cystic lesion in the tail slightly smaller than that seen on prior exam. Bilateral adrenal adenomas stable from the prior study. Diverticulosis without diverticulitis. Electronically Signed   By: Inez Catalina M.D.   On: 11/15/2019 18:45   DG Chest Port 1 View  Result Date: 11/15/2019 CLINICAL DATA:  Dyspnea and tachycardia EXAM: PORTABLE CHEST 1 VIEW COMPARISON:  11/03/2018 FINDINGS: Cardiac shadow is stable. Aortic calcifications are again seen. Lungs are well aerated bilaterally. No focal infiltrate or sizable effusion is seen. No bony abnormality is noted. IMPRESSION: No active disease. Electronically Signed   By: Inez Catalina M.D.   On: 11/15/2019 16:28    EKG: Independently reviewed.  SVT.  Second EEG shows normal sinus rhythm.  Assessment/Plan Principal Problem:   SVT (supraventricular tachycardia) (HCC) Active Problems:   Breast cancer, left breast (HCC)   Generalized weakness    1. SVT -converted spontaneously to sinus rhythm without any intervention.  May consult cardiology in the morning for follow-up.  Check thyroid function test. 2. Generalized weakness and fatigue could be from deconditioning and also possible metastatic malignancy. 3. Possible metastatic malignancy seen in the CAT scan with previous history of breast cancer followed by Dr. Burr Medico oncologist will need to consult Dr. Burr Medico in the morning to get further advice. 4. Previous history  of endometrial carcinoma status post hysterectomy.   DVT prophylaxis: Heparin. Code Status: Full code. Family Communication: Patient's daughter. Disposition Plan: Home. Consults called: None. Admission status: Observation.   Rise Patience MD [facility] Pager [phone].  If 7PM-7AM, please contact night-coverage [email or web address] Password [facility]  11/15/2019, 9:44 PM

## 2019-11-15 NOTE — Patient Instructions (Signed)
Please go now to [facility] ED

## 2019-11-16 ENCOUNTER — Encounter (HOSPITAL_COMMUNITY): Payer: Self-pay | Admitting: Internal Medicine

## 2019-11-16 DIAGNOSIS — I471 Supraventricular tachycardia: Principal | ICD-10-CM

## 2019-11-16 DIAGNOSIS — Z170 Estrogen receptor positive status [ER+]: Secondary | ICD-10-CM

## 2019-11-16 DIAGNOSIS — C50012 Malignant neoplasm of nipple and areola, left female breast: Secondary | ICD-10-CM | POA: Diagnosis not present

## 2019-11-16 DIAGNOSIS — C50912 Malignant neoplasm of unspecified site of left female breast: Secondary | ICD-10-CM | POA: Diagnosis not present

## 2019-11-16 DIAGNOSIS — C787 Secondary malignant neoplasm of liver and intrahepatic bile duct: Secondary | ICD-10-CM

## 2019-11-16 DIAGNOSIS — C7800 Secondary malignant neoplasm of unspecified lung: Secondary | ICD-10-CM

## 2019-11-16 DIAGNOSIS — Z8542 Personal history of malignant neoplasm of other parts of uterus: Secondary | ICD-10-CM

## 2019-11-16 LAB — BASIC METABOLIC PANEL
Anion gap: 9 (ref 5–15)
BUN: 9 mg/dL (ref 8–23)
CO2: 23 mmol/L (ref 22–32)
Calcium: 9.4 mg/dL (ref 8.9–10.3)
Chloride: 103 mmol/L (ref 98–111)
Creatinine, Ser: 0.6 mg/dL (ref 0.44–1.00)
GFR calc non Af Amer: >60 mL/min (ref >60)
Glucose, Bld: 70 mg/dL (ref 70–99)
Potassium: 4.2 mmol/L (ref 3.5–5.1)
Sodium: 135 mmol/L (ref 135–145)

## 2019-11-16 LAB — CBC
HCT: 43.9 % (ref 36.0–46.0)
Hemoglobin: 13.8 g/dL (ref 12.0–15.0)
MCH: 29.2 pg (ref 26.0–34.0)
MCHC: 31.4 g/dL (ref 30.0–36.0)
MCV: 93 fL (ref 80.0–100.0)
Platelets: 220 10*3/uL (ref 150–400)
RBC: 4.72 MIL/uL (ref 3.87–5.11)
RDW: 14.3 % (ref 11.5–15.5)
WBC: 6.7 10*3/uL (ref 4.0–10.5)
nRBC: 0 % (ref 0.0–0.2)

## 2019-11-16 LAB — URINALYSIS, ROUTINE W REFLEX MICROSCOPIC
Bilirubin Urine: NEGATIVE
Glucose, UA: NEGATIVE mg/dL
Hgb urine dipstick: NEGATIVE
Ketones, ur: 20 mg/dL — AB
Leukocytes,Ua: NEGATIVE
Nitrite: NEGATIVE
Protein, ur: NEGATIVE mg/dL
Specific Gravity, Urine: >1.046 — ABNORMAL HIGH (ref 1.005–1.030)
pH: 5 (ref 5.0–8.0)

## 2019-11-16 LAB — TROPONIN I (HIGH SENSITIVITY)
Troponin I (High Sensitivity): 15 ng/L (ref <18)
Troponin I (High Sensitivity): 10 ng/L (ref <18)

## 2019-11-16 LAB — TYPE AND SCREEN
ABO/RH(D): B POS
Antibody Screen: NEGATIVE

## 2019-11-16 LAB — TSH: TSH: 2.817 u[IU]/mL (ref 0.350–4.500)

## 2019-11-16 MED ORDER — METOPROLOL TARTRATE 25 MG PO TABS
25.0000 mg | ORAL_TABLET | Freq: Two times a day (BID) | ORAL | Status: DC
  Administered 2019-11-16 – 2019-11-18 (×5): 25 mg via ORAL
  Filled 2019-11-16 (×5): qty 1

## 2019-11-16 MED ORDER — HYDROCORTISONE 1 % EX CREA
TOPICAL_CREAM | Freq: Two times a day (BID) | CUTANEOUS | Status: DC | PRN
  Filled 2019-11-16: qty 28

## 2019-11-16 NOTE — Consult Note (Signed)
Chief Complaint: Patient was seen in consultation today for liver lesion  Referring Physician(s): Mikey Bussing, DNP  Supervising Physician: Dr. Ruthann Cancer  Patient Status: [facility] - In-pt  History of Present Illness: Doris Zamora is a 86 y.o. female with past medical history of anxiety, endometrial cancer 2007, HLD, left breast cancer 2015 on chemotherapy who was admitted to [facility] with SVT.  SVT has spontaneously resolved, however she complained of poor appetite and weight loss.  CT Abdomen Pelvis showed right lung mass, hilar and mediastinal lymphadenopathy, and innumerable metastases.  IR consulted for liver lesion biopsy at the request of Oncology.   Patient assessed at bedside.  She is accompanied by her daughter.  She has been informed of her imaging results and has met with Oncology this afternoon to discuss biopsy.  They are agreeable to proceed.  She is eating lunch during assessment.   Past Medical History:  Diagnosis Date  . ANXIETY   . Arthritis   . Endometrial cancer ([place]) 2007  . Hearing loss   . Heartburn    occ  . Hyperglycemia 04/20/2014  . HYPERLIPIDEMIA   . Hypothyroidism   . INSOMNIA-SLEEP DISORDER-UNSPEC   . Peripheral edema   . Seasonal allergies   . Venous insufficiency   . WEIGHT LOSS     Past Surgical History:  Procedure Laterality Date  . ABDOMINAL HYSTERECTOMY  2007  . BREAST BIOPSY Left 01/26/2014   x3, malignant  . CATARACT EXTRACTION, BILATERAL    . OOPHORECTOMY  2007  . THYROIDECTOMY, PARTIAL  1983  . TOTAL KNEE ARTHROPLASTY Left 11/08/2018   Procedure: LEFT TOTAL KNEE ARTHROPLASTY;  Surgeon: Frederik Pear, MD;  Location: [facility] ORS;  Service: Orthopedics;  Laterality: Left;    Allergies: Synthroid [levothyroxine sodium] and Pneumovax [pneumococcal polysaccharide vaccine]  Medications: Prior to Admission medications   Medication Sig Start Date End Date Taking? Authorizing Provider  ALPRAZolam (XANAX) 0.5 MG tablet TAKE 1 TABLET(0.5 MG)  BY MOUTH DAILY AS NEEDED Patient taking differently: Take 0.5 mg by mouth at bedtime as needed for anxiety.  11/04/19  Yes Biagio Borg, MD  anastrozole (ARIMIDEX) 1 MG tablet TAKE 1 TABLET(1 MG) BY MOUTH DAILY Patient taking differently: Take 1 mg by mouth daily.  09/12/19  Yes Truitt Merle, MD  BLACK CURRANT SEED OIL PO Take 1 tablet by mouth daily.    Yes [provider]  cetirizine (ZYRTEC) 10 MG tablet TAKE 1 TABLET(10 MG) BY MOUTH DAILY Patient taking differently: Take 10 mg by mouth daily.  08/16/18  Yes Biagio Borg, MD  cholecalciferol (VITAMIN D) 1000 UNITS tablet Take 1,000 Units by mouth daily.    Yes [provider]  Coenzyme Q10 (COQ10) 100 MG CAPS Take 100 mg by mouth daily.   Yes [provider]  fluticasone (FLONASE) 50 MCG/ACT nasal spray Place 2 sprays into both nostrils daily. 06/05/17  Yes Biagio Borg, MD  Krill Oil 350 MG CAPS Take 350 mg by mouth daily.   Yes [provider]  Menthol, Topical Analgesic, (BIOFREEZE EX) Apply 1 patch topically daily as needed (pain).   Yes [provider]  OVER THE COUNTER MEDICATION Take 1 tablet by mouth daily. Instaflex   Yes [provider]  potassium chloride (KLOR-CON) 10 MEQ tablet TAKE 1 TABLET BY MOUTH EVERY DAY AS NEEDED FOR WITH USE OF LASIX Patient taking differently: Take 10 mEq by mouth daily.  06/06/19  Yes Biagio Borg, MD  Thiamine HCl (VITAMIN B-1)  250 MG tablet Take 250 mg by mouth daily.   Yes [provider]  traMADol (ULTRAM) 50 MG tablet Take 1 tablet (50 mg total) by mouth every 6 (six) hours as needed. Patient taking differently: Take 50 mg by mouth every 6 (six) hours as needed for moderate pain.  10/05/19  Yes Draper, Christia Reading R, DO  vitamin B-12 (CYANOCOBALAMIN) 1000 MCG tablet Take 1,000 mcg by mouth daily.   Yes [provider]  vitamin E 400 UNIT capsule Take 400 Units by mouth daily.   Yes [provider]     Family History  Problem  Relation Age of Onset  . Diabetes Mother   . Muscular dystrophy Father   . Cancer Other 40       breast cancer   . Cancer Sister 94       pancreatic cancer     Social History   Socioeconomic History  . Marital status: Widowed    Spouse name: Not on file  . Number of children: Not on file  . Years of education: Not on file  . Highest education level: Not on file  Occupational History  . Not on file  Tobacco Use  . Smoking status: Former Smoker    Years: 50.00    Types: Cigarettes    Quit date: 02/10/1994    Years since quitting: 25.7  . Smokeless tobacco: Never Used  Substance and Sexual Activity  . Alcohol use: Yes    Alcohol/week: 7.0 standard drinks    Types: 7 Glasses of wine per week    Comment: 4 0z. HS to help her sleep  . Drug use: No    Comment: admiited to poat use of marjuana to help her sleep  . Sexual activity: Not on file  Other Topics Concern  . 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   Social Determinants of Radio broadcast assistant Strain:   . Difficulty of Paying Living Expenses: Not on file  Food Insecurity:   . Worried About Charity fundraiser in the Last Year: Not on file  . Ran Out of Food in the Last Year: Not on file  Transportation Needs:   . Lack of Transportation (Medical): Not on file  . Lack of Transportation (Non-Medical): Not on file  Physical Activity:   . Days of Exercise per Week: Not on file  . Minutes of Exercise per Session: Not on file  Stress:   . Feeling of Stress : Not on file  Social Connections:   . Frequency of Communication with Friends and Family: Not on file  . Frequency of Social Gatherings with Friends and Family: Not on file  . Attends Religious Services: Not on file  . Active Member of Clubs or Organizations: Not on file  . Attends Archivist Meetings: Not on file  . Marital Status: Not on file     Review of Systems: A 12 point ROS  discussed and pertinent positives are indicated in the HPI above.  All other systems are negative.  Review of Systems  Constitutional: Positive for appetite change. Negative for fatigue and fever.  Respiratory: Negative for cough and shortness of breath.   Cardiovascular: Negative for chest pain.  Gastrointestinal: Negative for abdominal pain and nausea.  Musculoskeletal: Negative for back pain.  Psychiatric/Behavioral: Negative for behavioral problems and confusion.    Vital Signs: BP (!) 145/61 (  BP Location: Right Arm)   Pulse 78   Temp 97.7 F (36.5 C) (Oral)   Resp 18   Ht 5' 6"  (1.676 m)   Wt 166 lb 3.6 oz (75.4 kg)   SpO2 100%   BMI 26.83 kg/m   Physical Exam Vitals and nursing note reviewed.  Constitutional:      Appearance: Normal appearance.  HENT:     Mouth/Throat:     Mouth: Mucous membranes are moist.     Pharynx: Oropharynx is clear.  Cardiovascular:     Rate and Rhythm: Normal rate and regular rhythm.  Pulmonary:     Effort: Pulmonary effort is normal. No respiratory distress.     Breath sounds: Normal breath sounds.  Abdominal:     General: Abdomen is flat.     Palpations: Abdomen is soft.  Skin:    General: Skin is warm and dry.  Neurological:     General: No focal deficit present.     Mental Status: She is alert and oriented to person, place, and time. Mental status is at baseline.  Psychiatric:        Mood and Affect: Mood normal.        Behavior: Behavior normal.        Thought Content: Thought content normal.        Judgment: Judgment normal.      MD Evaluation Airway: WNL Heart: WNL Abdomen: WNL Chest/ Lungs: WNL ASA  Classification: 3 Mallampati/Airway Score: One   Imaging: CT Angio Chest PE W and/or Wo Contrast  Result Date: 11/15/2019 CLINICAL DATA:  Chest and abdominal pain with elevated LFTs EXAM: CT ANGIOGRAPHY CHEST CT ABDOMEN AND PELVIS WITH CONTRAST TECHNIQUE: Multidetector CT imaging of the chest was performed using the  standard protocol during bolus administration of intravenous contrast. Multiplanar CT image reconstructions and MIPs were obtained to evaluate the vascular anatomy. Multidetector CT imaging of the abdomen and pelvis was performed using the standard protocol during bolus administration of intravenous contrast. CONTRAST:  136m OMNIPAQUE IOHEXOL 350 MG/ML SOLN COMPARISON:  06/17/2012 FINDINGS: CTA CHEST FINDINGS Cardiovascular: Thoracic aortic calcifications are noted without aneurysmal dilatation. No cardiac enlargement is seen. Pulmonary artery shows a normal branching pattern. No filling defects are identified. Coronary calcifications are noted. Mediastinum/Nodes: Thoracic inlet is within normal limits. Multiple prominent mediastinal lymph nodes are noted. The largest of these is a conglomeration of nodes adjacent to the carina measuring 1.9 cm in short axis. 19 mm short axis right hilar lymph node is noted as well. The esophagus is within normal limits. Lungs/Pleura: Mild emphysematous changes are noted in the left lung. The lung is otherwise clear. Right lung demonstrates a rounded soft tissue mass lesion measuring 2.1 x 1.5 cm in the right upper lobe best seen on image number 33 of series 5. Some more peripheral smaller nodular densities are noted as well. No sizable effusion or pneumothorax is noted. Musculoskeletal: Degenerative changes of the thoracic spine are seen. No lytic lesions are noted. Review of the MIP images confirms the above findings. CT ABDOMEN and PELVIS FINDINGS Hepatobiliary: Liver demonstrates multiple peripherally enhancing lesions consistent with metastatic disease. There are too numerous to count although an index lesion in the left lobe measures 2.6 cm in dimension best seen on image number 26 of series 5. Index lesion on the right inferiorly measures approximately 2.8 cm in greatest dimension. Gallbladder is well distended with dependent gallstones. No biliary ductal dilatation is  noted. Pancreas: Pancreas again demonstrates a cystic lesion  in the tail slightly smaller than that seen on the prior exam now measuring 1.6 cm. Spleen: Normal in size without focal abnormality. Adrenals/Urinary Tract: Adrenal glands demonstrate bilateral hypodense lesions stable in appearance from 2014 consistent with adrenal adenomas. Kidneys demonstrate a normal enhancement pattern. No renal calculi or urinary tract obstructive changes are seen. The bladder is partially distended. Stomach/Bowel: Diverticular change of the colon is noted. No definitive colonic mass is seen. The appendix is not well visualized and likely has been surgically removed. No small bowel or gastric abnormality is seen. Vascular/Lymphatic: Diffuse vascular calcifications are noted in the abdominal aorta and iliac vessels. No aneurysmal dilatation is seen. Some lymphadenopathy is noted in the gastrohepatic ligament and to a greater degree in the portacaval space and peripancreatic region. The largest of these measures approximately 10 mm in short axis. Reproductive: Uterus has been surgically removed. No adnexal mass is noted. Other: No free pelvic fluid is seen. Musculoskeletal: Degenerative changes of lumbar spine are noted. No compression deformities are seen. Review of the MIP images confirms the above findings. IMPRESSION: CTA of the chest: No evidence of pulmonary emboli. 2.1 cm soft tissue mass lesion in the right upper lung with some peripheral nodularity as well as associated hilar and mediastinal adenopathy. Although this may be related to metastatic disease given the findings in the liver, the possibility of a primary pulmonary neoplasm deserves primary consideration. CT of the abdomen and pelvis: Changes consistent with diffuse hepatic metastatic disease. Tissue sampling is recommended for further evaluation. Associated adenopathy in the upper abdomen is noted as well. Cholelithiasis without complicating factors. Pancreatic  cystic lesion in the tail slightly smaller than that seen on prior exam. Bilateral adrenal adenomas stable from the prior study. Diverticulosis without diverticulitis. Electronically Signed   By: Inez Catalina M.D.   On: 11/15/2019 18:45   CT ABDOMEN PELVIS W CONTRAST  Result Date: 11/15/2019 CLINICAL DATA:  Chest and abdominal pain with elevated LFTs EXAM: CT ANGIOGRAPHY CHEST CT ABDOMEN AND PELVIS WITH CONTRAST TECHNIQUE: Multidetector CT imaging of the chest was performed using the standard protocol during bolus administration of intravenous contrast. Multiplanar CT image reconstructions and MIPs were obtained to evaluate the vascular anatomy. Multidetector CT imaging of the abdomen and pelvis was performed using the standard protocol during bolus administration of intravenous contrast. CONTRAST:  173m OMNIPAQUE IOHEXOL 350 MG/ML SOLN COMPARISON:  06/17/2012 FINDINGS: CTA CHEST FINDINGS Cardiovascular: Thoracic aortic calcifications are noted without aneurysmal dilatation. No cardiac enlargement is seen. Pulmonary artery shows a normal branching pattern. No filling defects are identified. Coronary calcifications are noted. Mediastinum/Nodes: Thoracic inlet is within normal limits. Multiple prominent mediastinal lymph nodes are noted. The largest of these is a conglomeration of nodes adjacent to the carina measuring 1.9 cm in short axis. 19 mm short axis right hilar lymph node is noted as well. The esophagus is within normal limits. Lungs/Pleura: Mild emphysematous changes are noted in the left lung. The lung is otherwise clear. Right lung demonstrates a rounded soft tissue mass lesion measuring 2.1 x 1.5 cm in the right upper lobe best seen on image number 33 of series 5. Some more peripheral smaller nodular densities are noted as well. No sizable effusion or pneumothorax is noted. Musculoskeletal: Degenerative changes of the thoracic spine are seen. No lytic lesions are noted. Review of the MIP images  confirms the above findings. CT ABDOMEN and PELVIS FINDINGS Hepatobiliary: Liver demonstrates multiple peripherally enhancing lesions consistent with metastatic disease. There are too numerous to count  although an index lesion in the left lobe measures 2.6 cm in dimension best seen on image number 26 of series 5. Index lesion on the right inferiorly measures approximately 2.8 cm in greatest dimension. Gallbladder is well distended with dependent gallstones. No biliary ductal dilatation is noted. Pancreas: Pancreas again demonstrates a cystic lesion in the tail slightly smaller than that seen on the prior exam now measuring 1.6 cm. Spleen: Normal in size without focal abnormality. Adrenals/Urinary Tract: Adrenal glands demonstrate bilateral hypodense lesions stable in appearance from 2014 consistent with adrenal adenomas. Kidneys demonstrate a normal enhancement pattern. No renal calculi or urinary tract obstructive changes are seen. The bladder is partially distended. Stomach/Bowel: Diverticular change of the colon is noted. No definitive colonic mass is seen. The appendix is not well visualized and likely has been surgically removed. No small bowel or gastric abnormality is seen. Vascular/Lymphatic: Diffuse vascular calcifications are noted in the abdominal aorta and iliac vessels. No aneurysmal dilatation is seen. Some lymphadenopathy is noted in the gastrohepatic ligament and to a greater degree in the portacaval space and peripancreatic region. The largest of these measures approximately 10 mm in short axis. Reproductive: Uterus has been surgically removed. No adnexal mass is noted. Other: No free pelvic fluid is seen. Musculoskeletal: Degenerative changes of lumbar spine are noted. No compression deformities are seen. Review of the MIP images confirms the above findings. IMPRESSION: CTA of the chest: No evidence of pulmonary emboli. 2.1 cm soft tissue mass lesion in the right upper lung with some peripheral  nodularity as well as associated hilar and mediastinal adenopathy. Although this may be related to metastatic disease given the findings in the liver, the possibility of a primary pulmonary neoplasm deserves primary consideration. CT of the abdomen and pelvis: Changes consistent with diffuse hepatic metastatic disease. Tissue sampling is recommended for further evaluation. Associated adenopathy in the upper abdomen is noted as well. Cholelithiasis without complicating factors. Pancreatic cystic lesion in the tail slightly smaller than that seen on prior exam. Bilateral adrenal adenomas stable from the prior study. Diverticulosis without diverticulitis. Electronically Signed   By: Inez Catalina M.D.   On: 11/15/2019 18:45   DG Chest Port 1 View  Result Date: 11/15/2019 CLINICAL DATA:  Dyspnea and tachycardia EXAM: PORTABLE CHEST 1 VIEW COMPARISON:  11/03/2018 FINDINGS: Cardiac shadow is stable. Aortic calcifications are again seen. Lungs are well aerated bilaterally. No focal infiltrate or sizable effusion is seen. No bony abnormality is noted. IMPRESSION: No active disease. Electronically Signed   By: Inez Catalina M.D.   On: 11/15/2019 16:28    Labs:  CBC: Recent Labs    08/04/19 1500 08/04/19 1500 09/14/19 1309 11/15/19 1546 11/15/19 1617 11/16/19 0230  WBC 5.6  --  5.4 7.0  --  6.7  HGB 13.7  --  13.3 15.4* 16.7* 13.8  HCT 41.1   < > 41.4 47.2* 49.0* 43.9  PLT 207.0  --  193 246  --  220   < > = values in this interval not displayed.    COAGS: No results for input(s): INR, APTT in the last 8760 hours.  BMP: Recent Labs    05/20/19 1355 05/20/19 1355 08/04/19 1500 08/04/19 1500 09/14/19 1309 11/15/19 1555 11/15/19 1617 11/16/19 0230  NA 137   < > 138   < > 139 134* 138 135  K 4.4   < > 4.7   < > 3.8 4.0 4.3 4.2  CL 106   < > 102   < >  106 103 105 103  CO2 28   < > 29  --  27 21*  --  23  GLUCOSE 86   < > 77   < > 93 94 92 70  BUN 9   < > 13   < > 17 12 16 9   CALCIUM 9.0    < > 9.7  --  9.6 10.2  --  9.4  CREATININE 0.73   < > 0.75  --  0.80 0.63 0.60 0.60  GFRNONAA >60  --   --   --  >60 >60  --  >60  GFRAA >60  --   --   --  >60  --   --   --    < > = values in this interval not displayed.    LIVER FUNCTION TESTS: Recent Labs    05/20/19 1355 08/04/19 1500 09/14/19 1309 11/15/19 1555  BILITOT 0.4 0.6 0.5 1.2  AST 43* 58* 89* 283*  ALT 42 37* 41 92*  ALKPHOS 100 100 114 203*  PROT 8.5* 8.5* 8.1 9.0*  ALBUMIN 3.1* 3.8 3.1* 3.0*    TUMOR MARKERS: No results for input(s): AFPTM, CEA, CA199, CHROMGRNA in the last 8760 hours.  Assessment and Plan: Left-sided breast cancer  Diagnosed in 2015 presents with complaint of liver lesions, concern for metastatic progression.  IR consulted for liver lesion biopsy at the request of Mikey Bussing, DNP. Case reviewed by Dr. Serafina Royals who approves patient for procedure.  NPO p MN tonight.  Hold PM lovenox- documented on MAR.   Risks and benefits was discussed with the patient and/or patient's family including, but not limited to bleeding, infection, damage to adjacent structures or low yield requiring additional tests.  All of the questions were answered and there is agreement to proceed.  Consent signed and in chart.  Thank you for this interesting consult.  I greatly enjoyed meeting Doris Zamora and look forward to participating in their care.  A copy of this report was sent to the requesting provider on this date.  Electronically Signed: Docia Barrier, PA 11/16/2019, 1:39 PM   I spent a total of 40 Minutes    in face to face in clinical consultation, greater than 50% of which was counseling/coordinating care for liver lesion.

## 2019-11-16 NOTE — Consult Note (Addendum)
Cardiology Consultation:   Patient ID: Doris Zamora MRN: [id number]; DOB: 1930-09-07  Admit date: 11/15/2019 Date of Consult: 11/16/2019  Primary Care Provider: Biagio Borg, MD [facility] Cardiologist: New - [facility] Electrophysiologist:  None    Patient Profile:   Doris Zamora is a 86 y.o. female with a hx of breast cancer, endometrial cancer s/p hysterectomy, hyperlipidemia, hypothyroidism and former tobacco use who is being seen today for the evaluation of breast cancer, endometrial cancer s/p hysterectomy, hyperlipidemia, hypothyroidism and former tobacco use at the request of Dr/ Doris Zamora.  History of Present Illness:   Doris Zamora is a frail 86 year old female with past medical history of breast cancer, endometrial cancer s/p hysterectomy, hyperlipidemia, hypothyroidism and former tobacco use.  She has a history of breast cancer on anastrozole and has declined a mastectomy previously.  According to the daughter, she has lost close to 30 pounds in the past 6 weeks due to loss of appetite.  For the past 3 weeks, she has been having increasing weakness, chest discomfort and shortness of breath.  Her PCPs office initially instructed the daughter to take the patient to a local urgent care, however due to COVID-19 pandemic, she is fearful to go to the urgent care.  She was he able to see Doris Zamora on 11/15/2019 who noted she was in SVT with heart rate in the 170s.  She was subsequently sent to [facility].  After IV hydration, she broke out of SVT by herself.  Since then, she has been placed on Lopressor 25 mg twice a day.  Cardiology has been consulted for SVT.  Troponin was negative x2.  Due to the recently elevated LFT and her overall symptom, CT of chest and abdomen were obtained in the emergency room.  CT image demonstrated pulmonary nodule as well as metastatic changes in her liver.  Oncology is currently been consulted.   Past Medical History:   Diagnosis Date   ANXIETY    Arthritis    Endometrial cancer ([place]) 2007   Hearing loss    Heartburn    occ   Hyperglycemia 04/20/2014   HYPERLIPIDEMIA    Hypothyroidism    INSOMNIA-SLEEP DISORDER-UNSPEC    Peripheral edema    Seasonal allergies    Venous insufficiency    WEIGHT LOSS     Past Surgical History:  Procedure Laterality Date   ABDOMINAL HYSTERECTOMY  2007   BREAST BIOPSY Left 01/26/2014   x3, malignant   CATARACT EXTRACTION, BILATERAL     OOPHORECTOMY  2007   THYROIDECTOMY, PARTIAL  1983   TOTAL KNEE ARTHROPLASTY Left 11/08/2018   Procedure: LEFT TOTAL KNEE ARTHROPLASTY;  Surgeon: Doris Pear, MD;  Location: [facility];  Service: Orthopedics;  Laterality: Left;     Home Medications:  Prior to Admission medications   Medication Sig Start Date End Date Taking? Authorizing Provider  ALPRAZolam (XANAX) 0.5 MG tablet TAKE 1 TABLET(0.5 MG) BY MOUTH DAILY AS NEEDED Patient taking differently: Take 0.5 mg by mouth at bedtime as needed for anxiety.  11/04/19  Yes Doris Borg, MD  anastrozole (ARIMIDEX) 1 MG tablet TAKE 1 TABLET(1 MG) BY MOUTH DAILY Patient taking differently: Take 1 mg by mouth daily.  09/12/19  Yes Doris Merle, MD  BLACK CURRANT SEED OIL PO Take 1 tablet by mouth daily.    Yes [provider]  cetirizine (ZYRTEC) 10 MG tablet TAKE 1 TABLET(10 MG) BY MOUTH DAILY Patient taking differently: Take 10  mg by mouth daily.  08/16/18  Yes Doris Borg, MD  cholecalciferol (VITAMIN D) 1000 UNITS tablet Take 1,000 Units by mouth daily.    Yes [provider]  Coenzyme Q10 (COQ10) 100 MG CAPS Take 100 mg by mouth daily.   Yes [provider]  fluticasone (FLONASE) 50 MCG/ACT nasal spray Place 2 sprays into both nostrils daily. 06/05/17  Yes Doris Borg, MD  Krill Oil 350 MG CAPS Take 350 mg by mouth daily.   Yes [provider]  Menthol, Topical Analgesic, (BIOFREEZE EX) Apply 1 patch topically daily as needed (pain).    Yes [provider]  OVER THE COUNTER MEDICATION Take 1 tablet by mouth daily. Instaflex   Yes [provider]  potassium chloride (KLOR-CON) 10 MEQ tablet TAKE 1 TABLET BY MOUTH EVERY DAY AS NEEDED FOR WITH USE OF LASIX Patient taking differently: Take 10 mEq by mouth daily.  06/06/19  Yes Doris Borg, MD  Thiamine HCl (VITAMIN B-1) 250 MG tablet Take 250 mg by mouth daily.   Yes [provider]  traMADol (ULTRAM) 50 MG tablet Take 1 tablet (50 mg total) by mouth every 6 (six) hours as needed. Patient taking differently: Take 50 mg by mouth every 6 (six) hours as needed for moderate pain.  10/05/19  Yes Zamora, Doris Reading R, DO  vitamin B-12 (CYANOCOBALAMIN) 1000 MCG tablet Take 1,000 mcg by mouth daily.   Yes [provider]  vitamin E 400 UNIT capsule Take 400 Units by mouth daily.   Yes [provider]    Inpatient Medications: Scheduled Meds:  anastrozole  1 mg Oral Daily   enoxaparin (LOVENOX) injection  40 mg Subcutaneous Q24H   metoprolol tartrate  25 mg Oral BID   vitamin B-1  250 mg Oral Daily   vitamin B-12  1,000 mcg Oral Daily   Continuous Infusions:  PRN Meds: acetaminophen **OR** acetaminophen, ALPRAZolam, hydrocortisone cream, ondansetron **OR** ondansetron (ZOFRAN) IV  Allergies:    Allergies  Allergen Reactions   Synthroid [Levothyroxine Sodium] Swelling    Happened yrs ago   Pneumovax [Pneumococcal Polysaccharide Vaccine] Swelling    Social History:   Social History   Socioeconomic History   Marital status: Widowed    Spouse name: Not on file   Number of children: Not on file   Years of education: Not on file   Highest education level: Not on file  Occupational History   Not on file  Tobacco Use   Smoking status: Former Smoker    Years: 50.00    Types: Cigarettes    Quit date: 02/10/1994    Years since quitting: 25.7   Smokeless tobacco: Never Used  Substance and Sexual Activity   Alcohol  use: Yes    Alcohol/week: 7.0 standard drinks    Types: 7 Glasses of wine per week    Comment: 4 0z. HS to help her sleep   Drug use: No    Comment: admiited to poat use of marjuana to help her sleep   Sexual activity: Not on file  Other Topics Concern   Not on file  Social History Narrative         Widow. 3 children - 2 living. retired Freight forwarder of Radio broadcast assistant Strain:    Difficulty of Paying Living Expenses: Not on file  Food Insecurity:    Worried About Charity fundraiser in the Last Year: Not on file   Ran  Out of Food in the Last Year: Not on file  Transportation Needs:    Lack of Transportation (Medical): Not on file   Lack of Transportation (Non-Medical): Not on file  Physical Activity:    Days of Exercise per Week: Not on file   Minutes of Exercise per Session: Not on file  Stress:    Feeling of Stress : Not on file  Social Connections:    Frequency of Communication with Friends and Family: Not on file   Frequency of Social Gatherings with Friends and Family: Not on file   Attends Religious Services: Not on file   Active Member of Clubs or Organizations: Not on file   Attends Archivist Meetings: Not on file   Marital Status: Not on file  Intimate Partner Violence:    Fear of Current or Ex-Partner: Not on file   Emotionally Abused: Not on file   Physically Abused: Not on file   Sexually Abused: Not on file    Family History:    Family History  Problem Relation Age of Onset   Diabetes Mother    Muscular dystrophy Father    Cancer Other 75       breast cancer    Cancer Sister 66       pancreatic cancer      ROS:  Please see the history of present illness.   All other ROS reviewed and negative.     Physical Exam/Data:   Vitals:   11/16/19 0200 11/16/19 0255 11/16/19 0738 11/16/19 1212  BP: 134/72 (!) 157/70 (!) 153/66 (!) 145/61  Pulse: 78 83 81 78  Resp: 18 16 18 18   Temp:   98.2 F (36.8 C) 97.8 F (36.6 C) 97.7 F (36.5 C)  TempSrc:  Oral Oral Oral  SpO2: 99% 99% 97% 100%  Weight: 75.4 kg     Height: 5\' 6"  (1.676 m)      No intake or output data in the 24 hours ending 11/16/19 1250 Last 3 Weights 11/16/2019 10/05/2019 09/14/2019  Weight (lbs) 166 lb 3.6 oz 190 lb 186 lb 6.4 oz  Weight (kg) 75.4 kg 86.183 kg 84.55 kg     Body mass index is 26.83 kg/m.  General:  Well nourished, well developed, in no acute distress HEENT: normal Lymph: no adenopathy Neck: no JVD Endocrine:  No thryomegaly Vascular: No carotid bruits; FA pulses 2+ bilaterally without bruits  Cardiac:  normal S1, S2; RRR; no murmur  Lungs:  clear to auscultation bilaterally, no wheezing, rhonchi or rales  Abd: soft, nontender, no hepatomegaly  Ext: no edema Musculoskeletal:  No deformities, BUE and BLE strength normal and equal Skin: warm and dry  Neuro:  CNs 2-12 intact, no focal abnormalities noted Psych:  Normal affect   EKG:  The EKG was personally reviewed and demonstrates: SVT with heart rate in the 170s Telemetry:  Telemetry was personally reviewed and demonstrates: Sinus rhythm without recurrent SVT  Relevant CV Studies:  N/A  Laboratory Data:  High Sensitivity Troponin:   Recent Labs  Lab 11/15/19 1555 11/15/19 1928 11/16/19 0230 11/16/19 0914  TROPONINIHS 8 6 15 10      Chemistry Recent Labs  Lab 11/15/19 1555 11/15/19 1617 11/16/19 0230  NA 134* 138 135  K 4.0 4.3 4.2  CL 103 105 103  CO2 21*  --  23  GLUCOSE 94 92 70  BUN 12 16 9   CREATININE 0.63 0.60 0.60  CALCIUM 10.2  --  9.4  GFRNONAA >60  --  >  60  ANIONGAP 10  --  9    Recent Labs  Lab 11/15/19 1555  PROT 9.0*  ALBUMIN 3.0*  AST 283*  ALT 92*  ALKPHOS 203*  BILITOT 1.2   Hematology Recent Labs  Lab 11/15/19 1546 11/15/19 1617 11/16/19 0230  WBC 7.0  --  6.7  RBC 5.16*  --  4.72  HGB 15.4* 16.7* 13.8  HCT 47.2* 49.0* 43.9  MCV 91.5  --  93.0  MCH 29.8  --  29.2  MCHC 32.6   --  31.4  RDW 14.2  --  14.3  PLT 246  --  220   BNPNo results for input(s): BNP, PROBNP in the last 168 hours.  DDimer  Recent Labs  Lab 11/15/19 1555  DDIMER 7.45*     Radiology/Studies:  CT Angio Chest PE W and/or Wo Contrast  Result Date: 11/15/2019 CLINICAL DATA:  Chest and abdominal pain with elevated LFTs EXAM: CT ANGIOGRAPHY CHEST CT ABDOMEN AND PELVIS WITH CONTRAST TECHNIQUE: Multidetector CT imaging of the chest was performed using the standard protocol during bolus administration of intravenous contrast. Multiplanar CT image reconstructions and MIPs were obtained to evaluate the vascular anatomy. Multidetector CT imaging of the abdomen and pelvis was performed using the standard protocol during bolus administration of intravenous contrast. CONTRAST:  173mL OMNIPAQUE IOHEXOL 350 MG/ML SOLN COMPARISON:  06/17/2012 FINDINGS: CTA CHEST FINDINGS Cardiovascular: Thoracic aortic calcifications are noted without aneurysmal dilatation. No cardiac enlargement is seen. Pulmonary artery shows a normal branching pattern. No filling defects are identified. Coronary calcifications are noted. Mediastinum/Nodes: Thoracic inlet is within normal limits. Multiple prominent mediastinal lymph nodes are noted. The largest of these is a conglomeration of nodes adjacent to the carina measuring 1.9 cm in short axis. 19 mm short axis right hilar lymph node is noted as well. The esophagus is within normal limits. Lungs/Pleura: Mild emphysematous changes are noted in the left lung. The lung is otherwise clear. Right lung demonstrates a rounded soft tissue mass lesion measuring 2.1 x 1.5 cm in the right upper lobe best seen on image number 33 of series 5. Some more peripheral smaller nodular densities are noted as well. No sizable effusion or pneumothorax is noted. Musculoskeletal: Degenerative changes of the thoracic spine are seen. No lytic lesions are noted. Review of the MIP images confirms the above findings. CT  ABDOMEN and PELVIS FINDINGS Hepatobiliary: Liver demonstrates multiple peripherally enhancing lesions consistent with metastatic disease. There are too numerous to count although an index lesion in the left lobe measures 2.6 cm in dimension best seen on image number 26 of series 5. Index lesion on the right inferiorly measures approximately 2.8 cm in greatest dimension. Gallbladder is well distended with dependent gallstones. No biliary ductal dilatation is noted. Pancreas: Pancreas again demonstrates a cystic lesion in the tail slightly smaller than that seen on the prior exam now measuring 1.6 cm. Spleen: Normal in size without focal abnormality. Adrenals/Urinary Tract: Adrenal glands demonstrate bilateral hypodense lesions stable in appearance from 2014 consistent with adrenal adenomas. Kidneys demonstrate a normal enhancement pattern. No renal calculi or urinary tract obstructive changes are seen. The bladder is partially distended. Stomach/Bowel: Diverticular change of the colon is noted. No definitive colonic mass is seen. The appendix is not well visualized and likely has been surgically removed. No small bowel or gastric abnormality is seen. Vascular/Lymphatic: Diffuse vascular calcifications are noted in the abdominal aorta and iliac vessels. No aneurysmal dilatation is seen. Some lymphadenopathy is noted in the gastrohepatic  ligament and to a greater degree in the portacaval space and peripancreatic region. The largest of these measures approximately 10 mm in short axis. Reproductive: Uterus has been surgically removed. No adnexal mass is noted. Other: No free pelvic fluid is seen. Musculoskeletal: Degenerative changes of lumbar spine are noted. No compression deformities are seen. Review of the MIP images confirms the above findings. IMPRESSION: CTA of the chest: No evidence of pulmonary emboli. 2.1 cm soft tissue mass lesion in the right upper lung with some peripheral nodularity as well as associated  hilar and mediastinal adenopathy. Although this may be related to metastatic disease given the findings in the liver, the possibility of a primary pulmonary neoplasm deserves primary consideration. CT of the abdomen and pelvis: Changes consistent with diffuse hepatic metastatic disease. Tissue sampling is recommended for further evaluation. Associated adenopathy in the upper abdomen is noted as well. Cholelithiasis without complicating factors. Pancreatic cystic lesion in the tail slightly smaller than that seen on prior exam. Bilateral adrenal adenomas stable from the prior study. Diverticulosis without diverticulitis. Electronically Signed   By: Inez Catalina M.D.   On: 11/15/2019 18:45   CT ABDOMEN PELVIS W CONTRAST  Result Date: 11/15/2019 CLINICAL DATA:  Chest and abdominal pain with elevated LFTs EXAM: CT ANGIOGRAPHY CHEST CT ABDOMEN AND PELVIS WITH CONTRAST TECHNIQUE: Multidetector CT imaging of the chest was performed using the standard protocol during bolus administration of intravenous contrast. Multiplanar CT image reconstructions and MIPs were obtained to evaluate the vascular anatomy. Multidetector CT imaging of the abdomen and pelvis was performed using the standard protocol during bolus administration of intravenous contrast. CONTRAST:  166mL OMNIPAQUE IOHEXOL 350 MG/ML SOLN COMPARISON:  06/17/2012 FINDINGS: CTA CHEST FINDINGS Cardiovascular: Thoracic aortic calcifications are noted without aneurysmal dilatation. No cardiac enlargement is seen. Pulmonary artery shows a normal branching pattern. No filling defects are identified. Coronary calcifications are noted. Mediastinum/Nodes: Thoracic inlet is within normal limits. Multiple prominent mediastinal lymph nodes are noted. The largest of these is a conglomeration of nodes adjacent to the carina measuring 1.9 cm in short axis. 19 mm short axis right hilar lymph node is noted as well. The esophagus is within normal limits. Lungs/Pleura: Mild  emphysematous changes are noted in the left lung. The lung is otherwise clear. Right lung demonstrates a rounded soft tissue mass lesion measuring 2.1 x 1.5 cm in the right upper lobe best seen on image number 33 of series 5. Some more peripheral smaller nodular densities are noted as well. No sizable effusion or pneumothorax is noted. Musculoskeletal: Degenerative changes of the thoracic spine are seen. No lytic lesions are noted. Review of the MIP images confirms the above findings. CT ABDOMEN and PELVIS FINDINGS Hepatobiliary: Liver demonstrates multiple peripherally enhancing lesions consistent with metastatic disease. There are too numerous to count although an index lesion in the left lobe measures 2.6 cm in dimension best seen on image number 26 of series 5. Index lesion on the right inferiorly measures approximately 2.8 cm in greatest dimension. Gallbladder is well distended with dependent gallstones. No biliary ductal dilatation is noted. Pancreas: Pancreas again demonstrates a cystic lesion in the tail slightly smaller than that seen on the prior exam now measuring 1.6 cm. Spleen: Normal in size without focal abnormality. Adrenals/Urinary Tract: Adrenal glands demonstrate bilateral hypodense lesions stable in appearance from 2014 consistent with adrenal adenomas. Kidneys demonstrate a normal enhancement pattern. No renal calculi or urinary tract obstructive changes are seen. The bladder is partially distended. Stomach/Bowel: Diverticular change of  the colon is noted. No definitive colonic mass is seen. The appendix is not well visualized and likely has been surgically removed. No small bowel or gastric abnormality is seen. Vascular/Lymphatic: Diffuse vascular calcifications are noted in the abdominal aorta and iliac vessels. No aneurysmal dilatation is seen. Some lymphadenopathy is noted in the gastrohepatic ligament and to a greater degree in the portacaval space and peripancreatic region. The largest of  these measures approximately 10 mm in short axis. Reproductive: Uterus has been surgically removed. No adnexal mass is noted. Other: No free pelvic fluid is seen. Musculoskeletal: Degenerative changes of lumbar spine are noted. No compression deformities are seen. Review of the MIP images confirms the above findings. IMPRESSION: CTA of the chest: No evidence of pulmonary emboli. 2.1 cm soft tissue mass lesion in the right upper lung with some peripheral nodularity as well as associated hilar and mediastinal adenopathy. Although this may be related to metastatic disease given the findings in the liver, the possibility of a primary pulmonary neoplasm deserves primary consideration. CT of the abdomen and pelvis: Changes consistent with diffuse hepatic metastatic disease. Tissue sampling is recommended for further evaluation. Associated adenopathy in the upper abdomen is noted as well. Cholelithiasis without complicating factors. Pancreatic cystic lesion in the tail slightly smaller than that seen on prior exam. Bilateral adrenal adenomas stable from the prior study. Diverticulosis without diverticulitis. Electronically Signed   By: Inez Catalina M.D.   On: 11/15/2019 18:45   DG Chest Port 1 View  Result Date: 11/15/2019 CLINICAL DATA:  Dyspnea and tachycardia EXAM: PORTABLE CHEST 1 VIEW COMPARISON:  11/03/2018 FINDINGS: Cardiac shadow is stable. Aortic calcifications are again seen. Lungs are well aerated bilaterally. No focal infiltrate or sizable effusion is seen. No bony abnormality is noted. IMPRESSION: No active disease. Electronically Signed   By: Inez Catalina M.D.   On: 11/15/2019 16:28   Assessment and Plan:   1. SVT: Likely related to dehydration and poor oral intake.  She is self converted out of SVT.  Agree with initiating beta-blocker therapy to help suppress SVT.  Also recommend a repeat echocardiogram to assess ejection fraction.  If EF is normal, would not recommend any further  work-up  2. History of breast cancer: Refused mastectomy. On anastrozole at home.  CT of chest abdomen concerning for metastatic changes in the liver, there is also a nodule in the lung which may represent carcinoma as well.  Oncology service is being consulted  -Likely contributing to the recent drastic weight loss  3. History of endometrial cancer s/p hysterectomy  4. Drastic weight loss with poor oral intake: Contributing to #1.  5. Hyperlipidemia  6. Hypothyroidism       For questions or updates, please contact [place] Please consult [email or web address] for contact info under    Signed, Almyra Deforest, [place]  11/16/2019 12:50 PM  Personally seen and examined. Agree with above.   86 year old with metastatic cancer with recent SVT episode. EKG personally reviewed from primary care physician, Dr. Gwynn Burly office. Appears to be a short RP tachycardia, possibly atrial tachycardia at 170 bpm. P waves are preceding each QRS complex. This SVT spontaneously converted without intervention.  Currently she is laying comfortably in bed, looks younger than stated age. Daughter at bedside. No significant chest pain or shortness of breath.  Telemetry personally reviewed shows no evidence further of SVT. TSH normal.  Assessment and plan:  Paroxysmal supraventricular tachycardia -Likely atrial tachycardia. -Agree with current metoprolol usage. Continue. Hopefully this will help  suppress. -Personally reviewed CT scan, no evidence of pericardial effusion or pericardial involvement of metastatic cancer. -TSH normal. -Checking echocardiogram to ensure proper structure and function.  Metastatic cancer changes in her liver -Oncology to evaluate. Weight loss, weakness.  Please let us know if we can be of further assistance.  Candee Furbish, MD

## 2019-11-16 NOTE — Progress Notes (Addendum)
HEMATOLOGY-ONCOLOGY PROGRESS NOTE  SUBJECTIVE: Doris Zamora is followed by our office for left breast cancer.  She has been on anastrozole.  She was admitted to the hospital with SVT.  Heart rate was in the 170s but now in the 70s to 80s.  Cardiology has been consulted.  The patient reports fatigue and weakness.  She also reports poor appetite and weight loss of about 20 pounds over the past 6 weeks.  She is not complaining of any chest pain but her daughter noticed that she gets quite short of breath with exertion.  Denies abdominal pain, nausea, vomiting.  Oncology History Overview Note  Breast cancer, left breast   Staging form: Breast, AJCC 7th Edition     Clinical: T1c, N0 - Unsigned Endometrial cancer   Staging form: Corpus Uteri - Adenosarcoma, AJCC 7th Edition     Clinical: T1c, N0 - Unsigned     Endometrial cancer ([place])  08/12/2005 Initial Diagnosis   Endometrial cancer, T1bN0, s/p hysrectomy    Breast cancer, left breast ([place])  01/13/2014 Imaging   mammogram and US showed 3 masses at 6 o'clock, retroareolar and 2 o'clock area, measuring 0.7-1.7cm.    01/26/2014 Initial Diagnosis   Breast cancer, left breast, multifocal (3 lesions, biopsy showed 2 similar lesions with lobular features, and the third lesion has ductal features).    02/10/2014 -  Anti-estrogen oral therapy   Pt declined surgery, started anastrozole 1 mg once daily started on 02/10/2014. Pt held anastrzole in 08/2016 and restarted 01/05/17.    09/19/2014 Mammogram    mammogram and ultrasound showed interval decrease in the size and density of the 3 previous biopsy sites of malignancy in the left breast.   08/18/2016 Mammogram   IMPRESSION: Unchanged appearance of the left breast with 3 biopsy proven cancers. No evidence of malignancy in the right breast.   08/18/2016 Imaging   Bone Density 08/18/16 T score -1.4, indicating patient is osteopenic   02/16/2017 Mammogram   Diagnostic bilateral mammogram with tomography:  IMPRESSION: No significant interval change in the appearance of the 3 sites of biopsy proven cancer in the left breast. No evidence of right breast malignancy.   04/02/2017 Imaging   Breast MRI  W WO Contrast 04/02/17 IMPRESSION: 1. 2 cm area of very mild non masslike enhancement within the RETROAREOLAR LEFT breast with LEFT nipple retraction compatible with known malignancy in the RETROAREOLAR region. It is difficult to determine interval change from prior studies given different modalities. 2. Biopsy clips within the UPPER-OUTER LEFT breast and LOWER LEFT breast without adjacent abnormal enhancement. 3. No MR evidence of RIGHT breast malignancy.   08/10/2017 Mammogram   08/10/2017 MM Diag Breast TOMO Bilateral IMPRESSION: 1. No significant interval change in the mammographic appearance of 3 sites of biopsy proven left breast cancer. 2. No mammographic evidence of malignancy on the right.      REVIEW OF SYSTEMS:   Constitutional: Denies fevers, chills.  Reports weight loss, fatigue, anorexia. Eyes: Denies blurriness of vision Ears, nose, mouth, throat, and face: Denies mucositis or sore throat Respiratory: Reports shortness of breath, no cough Cardiovascular: Denies palpitation, chest discomfort Gastrointestinal:  Denies nausea, heartburn or change in bowel habits Skin: Denies abnormal skin rashes Lymphatics: Denies new lymphadenopathy or easy bruising Neurological:Denies numbness, tingling or new weaknesses Behavioral/Psych: Mood is stable, no new changes  Extremities: No lower extremity edema All other systems were reviewed with the patient and are negative.  I have reviewed the past medical history, past surgical history,  social history and family history with the patient and they are unchanged from previous note.   PHYSICAL EXAMINATION: ECOG PERFORMANCE STATUS: 2 - Symptomatic, <50% confined to bed  Vitals:   11/16/19 0738 11/16/19 1212  BP: (!) 153/66 (!) 145/61  Pulse:  81 78  Resp: 18 18  Temp: 97.8 F (36.6 C) 97.7 F (36.5 C)  SpO2: 97% 100%   Filed Weights   11/16/19 0200  Weight: 75.4 kg    Intake/Output from previous day: No intake/output data recorded.  GENERAL: Awake and alert, no distress SKIN: skin color, texture, turgor are normal, no rashes or significant lesions EYES: No scleral icterus OROPHARYNX: Oral mucosa dry, no thrush or mucositis LUNGS: clear to auscultation and percussion with normal breathing effort HEART: regular rate & rhythm and no murmurs and no lower extremity edema ABDOMEN:abdomen soft, non-tender and normal bowel sounds NEURO: alert & oriented x 3 with fluent speech, no focal motor/sensory deficits  LABORATORY DATA:  I have reviewed the data as listed CMP Latest Ref Rng & Units 11/16/2019 11/15/2019 11/15/2019  Glucose 70 - 99 mg/dL 70 92 94  BUN 8 - 23 mg/dL 9 16 12   Creatinine 0.44 - 1.00 mg/dL 0.60 0.60 0.63  Sodium 135 - 145 mmol/L 135 138 134(L)  Potassium 3.5 - 5.1 mmol/L 4.2 4.3 4.0  Chloride 98 - 111 mmol/L 103 105 103  CO2 22 - 32 mmol/L 23 - 21(L)  Calcium 8.9 - 10.3 mg/dL 9.4 - 10.2  Total Protein 6.5 - 8.1 g/dL - - 9.0(H)  Total Bilirubin 0.3 - 1.2 mg/dL - - 1.2  Alkaline Phos 38 - 126 U/L - - 203(H)  AST 15 - 41 U/L - - 283(H)  ALT 0 - 44 U/L - - 92(H)    Lab Results  Component Value Date   WBC 6.7 11/16/2019   HGB 13.8 11/16/2019   HCT 43.9 11/16/2019   MCV 93.0 11/16/2019   PLT 220 11/16/2019   NEUTROABS 3.2 09/14/2019    CT Angio Chest PE W and/or Wo Contrast  Result Date: 11/15/2019 CLINICAL DATA:  Chest and abdominal pain with elevated LFTs EXAM: CT ANGIOGRAPHY CHEST CT ABDOMEN AND PELVIS WITH CONTRAST TECHNIQUE: Multidetector CT imaging of the chest was performed using the standard protocol during bolus administration of intravenous contrast. Multiplanar CT image reconstructions and MIPs were obtained to evaluate the vascular anatomy. Multidetector CT imaging of the abdomen and  pelvis was performed using the standard protocol during bolus administration of intravenous contrast. CONTRAST:  125m OMNIPAQUE IOHEXOL 350 MG/ML SOLN COMPARISON:  06/17/2012 FINDINGS: CTA CHEST FINDINGS Cardiovascular: Thoracic aortic calcifications are noted without aneurysmal dilatation. No cardiac enlargement is seen. Pulmonary artery shows a normal branching pattern. No filling defects are identified. Coronary calcifications are noted. Mediastinum/Nodes: Thoracic inlet is within normal limits. Multiple prominent mediastinal lymph nodes are noted. The largest of these is a conglomeration of nodes adjacent to the carina measuring 1.9 cm in short axis. 19 mm short axis right hilar lymph node is noted as well. The esophagus is within normal limits. Lungs/Pleura: Mild emphysematous changes are noted in the left lung. The lung is otherwise clear. Right lung demonstrates a rounded soft tissue mass lesion measuring 2.1 x 1.5 cm in the right upper lobe best seen on image number 33 of series 5. Some more peripheral smaller nodular densities are noted as well. No sizable effusion or pneumothorax is noted. Musculoskeletal: Degenerative changes of the thoracic spine are seen. No lytic lesions are noted.  Review of the MIP images confirms the above findings. CT ABDOMEN and PELVIS FINDINGS Hepatobiliary: Liver demonstrates multiple peripherally enhancing lesions consistent with metastatic disease. There are too numerous to count although an index lesion in the left lobe measures 2.6 cm in dimension best seen on image number 26 of series 5. Index lesion on the right inferiorly measures approximately 2.8 cm in greatest dimension. Gallbladder is well distended with dependent gallstones. No biliary ductal dilatation is noted. Pancreas: Pancreas again demonstrates a cystic lesion in the tail slightly smaller than that seen on the prior exam now measuring 1.6 cm. Spleen: Normal in size without focal abnormality. Adrenals/Urinary  Tract: Adrenal glands demonstrate bilateral hypodense lesions stable in appearance from 2014 consistent with adrenal adenomas. Kidneys demonstrate a normal enhancement pattern. No renal calculi or urinary tract obstructive changes are seen. The bladder is partially distended. Stomach/Bowel: Diverticular change of the colon is noted. No definitive colonic mass is seen. The appendix is not well visualized and likely has been surgically removed. No small bowel or gastric abnormality is seen. Vascular/Lymphatic: Diffuse vascular calcifications are noted in the abdominal aorta and iliac vessels. No aneurysmal dilatation is seen. Some lymphadenopathy is noted in the gastrohepatic ligament and to a greater degree in the portacaval space and peripancreatic region. The largest of these measures approximately 10 mm in short axis. Reproductive: Uterus has been surgically removed. No adnexal mass is noted. Other: No free pelvic fluid is seen. Musculoskeletal: Degenerative changes of lumbar spine are noted. No compression deformities are seen. Review of the MIP images confirms the above findings. IMPRESSION: CTA of the chest: No evidence of pulmonary emboli. 2.1 cm soft tissue mass lesion in the right upper lung with some peripheral nodularity as well as associated hilar and mediastinal adenopathy. Although this may be related to metastatic disease given the findings in the liver, the possibility of a primary pulmonary neoplasm deserves primary consideration. CT of the abdomen and pelvis: Changes consistent with diffuse hepatic metastatic disease. Tissue sampling is recommended for further evaluation. Associated adenopathy in the upper abdomen is noted as well. Cholelithiasis without complicating factors. Pancreatic cystic lesion in the tail slightly smaller than that seen on prior exam. Bilateral adrenal adenomas stable from the prior study. Diverticulosis without diverticulitis. Electronically Signed   By: Inez Catalina M.D.    On: 11/15/2019 18:45   CT ABDOMEN PELVIS W CONTRAST  Result Date: 11/15/2019 CLINICAL DATA:  Chest and abdominal pain with elevated LFTs EXAM: CT ANGIOGRAPHY CHEST CT ABDOMEN AND PELVIS WITH CONTRAST TECHNIQUE: Multidetector CT imaging of the chest was performed using the standard protocol during bolus administration of intravenous contrast. Multiplanar CT image reconstructions and MIPs were obtained to evaluate the vascular anatomy. Multidetector CT imaging of the abdomen and pelvis was performed using the standard protocol during bolus administration of intravenous contrast. CONTRAST:  143m OMNIPAQUE IOHEXOL 350 MG/ML SOLN COMPARISON:  06/17/2012 FINDINGS: CTA CHEST FINDINGS Cardiovascular: Thoracic aortic calcifications are noted without aneurysmal dilatation. No cardiac enlargement is seen. Pulmonary artery shows a normal branching pattern. No filling defects are identified. Coronary calcifications are noted. Mediastinum/Nodes: Thoracic inlet is within normal limits. Multiple prominent mediastinal lymph nodes are noted. The largest of these is a conglomeration of nodes adjacent to the carina measuring 1.9 cm in short axis. 19 mm short axis right hilar lymph node is noted as well. The esophagus is within normal limits. Lungs/Pleura: Mild emphysematous changes are noted in the left lung. The lung is otherwise clear. Right lung demonstrates a  rounded soft tissue mass lesion measuring 2.1 x 1.5 cm in the right upper lobe best seen on image number 33 of series 5. Some more peripheral smaller nodular densities are noted as well. No sizable effusion or pneumothorax is noted. Musculoskeletal: Degenerative changes of the thoracic spine are seen. No lytic lesions are noted. Review of the MIP images confirms the above findings. CT ABDOMEN and PELVIS FINDINGS Hepatobiliary: Liver demonstrates multiple peripherally enhancing lesions consistent with metastatic disease. There are too numerous to count although an index  lesion in the left lobe measures 2.6 cm in dimension best seen on image number 26 of series 5. Index lesion on the right inferiorly measures approximately 2.8 cm in greatest dimension. Gallbladder is well distended with dependent gallstones. No biliary ductal dilatation is noted. Pancreas: Pancreas again demonstrates a cystic lesion in the tail slightly smaller than that seen on the prior exam now measuring 1.6 cm. Spleen: Normal in size without focal abnormality. Adrenals/Urinary Tract: Adrenal glands demonstrate bilateral hypodense lesions stable in appearance from 2014 consistent with adrenal adenomas. Kidneys demonstrate a normal enhancement pattern. No renal calculi or urinary tract obstructive changes are seen. The bladder is partially distended. Stomach/Bowel: Diverticular change of the colon is noted. No definitive colonic mass is seen. The appendix is not well visualized and likely has been surgically removed. No small bowel or gastric abnormality is seen. Vascular/Lymphatic: Diffuse vascular calcifications are noted in the abdominal aorta and iliac vessels. No aneurysmal dilatation is seen. Some lymphadenopathy is noted in the gastrohepatic ligament and to a greater degree in the portacaval space and peripancreatic region. The largest of these measures approximately 10 mm in short axis. Reproductive: Uterus has been surgically removed. No adnexal mass is noted. Other: No free pelvic fluid is seen. Musculoskeletal: Degenerative changes of lumbar spine are noted. No compression deformities are seen. Review of the MIP images confirms the above findings. IMPRESSION: CTA of the chest: No evidence of pulmonary emboli. 2.1 cm soft tissue mass lesion in the right upper lung with some peripheral nodularity as well as associated hilar and mediastinal adenopathy. Although this may be related to metastatic disease given the findings in the liver, the possibility of a primary pulmonary neoplasm deserves primary  consideration. CT of the abdomen and pelvis: Changes consistent with diffuse hepatic metastatic disease. Tissue sampling is recommended for further evaluation. Associated adenopathy in the upper abdomen is noted as well. Cholelithiasis without complicating factors. Pancreatic cystic lesion in the tail slightly smaller than that seen on prior exam. Bilateral adrenal adenomas stable from the prior study. Diverticulosis without diverticulitis. Electronically Signed   By: Inez Catalina M.D.   On: 11/15/2019 18:45   DG Chest Port 1 View  Result Date: 11/15/2019 CLINICAL DATA:  Dyspnea and tachycardia EXAM: PORTABLE CHEST 1 VIEW COMPARISON:  11/03/2018 FINDINGS: Cardiac shadow is stable. Aortic calcifications are again seen. Lungs are well aerated bilaterally. No focal infiltrate or sizable effusion is seen. No bony abnormality is noted. IMPRESSION: No active disease. Electronically Signed   By: Inez Catalina M.D.   On: 11/15/2019 16:28    ASSESSMENT AND PLAN: 1.  [id number], stage I ER positive/PR positive/HER-2 negative left breast cancer 2.  Right lung mass with hilar and mediastinal adenopathy, and liver metastases 3.  SVT, resolved 4.  Anorexia/weight loss 5.  History of endometrial cancer status post hysterectomy 6.  Hypothyroidism 7.  Hyperlipidemia  -I have reviewed the CT scan results with the patient and her daughter who are at the  bedside.  We discussed that findings are concerning for a metastatic malignancy.  We discussed that this could represent a recurrent, metastatic breast cancer versus a primary lung neoplasm.  The patient does have a 20-pack-year history of smoking. -We discussed proceeding with a liver biopsy to better characterize what type of malignancy this is.  The patient would like to proceed.  I have placed an order for an ultrasound-guided liver biopsy by interventional radiology.  Further discussion regarding diagnosis, prognosis, and treatment options pending  work-up. -Recommend dietitian consult. -We will arrange for outpatient follow-up in our office to discuss biopsy results.   LOS: 0 days   Mikey Bussing, DNP, AGPCNP-BC, AOCNP 11/16/19   Addendum  I have seen the patient, examined her. I agree with the assessment and and plan and have edited the notes.   I have reviewed her CT scan images in person, and discussed with patient and her daughter at the bedside.  The scan findings and her recent weight loss, anorexia, and weakness are highly concerning for metastatic cancer, from breast cancer versus a secondary lung cancer.  I recommend ultrasound-guided liver biopsy by IR, which she has been scheduled for tomorrow.  I will arrange outpatient follow-up with me on next Tuesday 10/12 at 8:40am to discuss her biopsy results.  I sent a high-priority schedule message.  Patient and his daughter are aware of the appointment.  Okay to discharge after biopsy from oncology standpoint.  Doris Zamora  11/16/2019

## 2019-11-16 NOTE — Progress Notes (Signed)
[facility] PROGRESS NOTE    Progress Note  Doris Zamora  TDD:[id number] DOB: 03-Nov-1930 DOA: 11/15/2019 PCP: Biagio Borg, MD     Brief Narrative:   Doris Zamora is an 86 y.o. female past medical history of breast cancer on anastrozole declined mastectomy, history of endometrial cancer status post hysterectomy has had a poor appetite and not eating well for the past 2 weeks which has lost a considerable amount of weight over the last weeks.  Went to see the PCP on the day of admission was found to be tachycardic referred to the ED and found to be in SVT which spontaneously converted back.  CT angio of the chest and abdomen was done that showed a right upper lung lesion and adenopathy along with liver lesions concerning for metastatic disease.  Assessment/Plan:   SVT (supraventricular tachycardia) ([place]): Spontaneously converted to sinus rhythm. TSH 2.8. We will start her on oral metoprolol. Consult cardiology  Generalized weakness: Multifactorial in the setting of metastatic disease and SVT.  Metastatic stage IV breast cancer: We will need to consult hospice and palliative care. She had a mammogram in 08/10/2017 that showed breast cancer as she had radiographic appearance of 3 sites of biopsy that were proving to be left breast cancer: The carcinoma is grade I and has some lobular features. The carcinoma on part 2 is morphologically identical to that in part 1.  The carcinoma appears grade II and is likely a ductal phenotype.  History of endometrial cancer: Status post hysterectomy.  RN Pressure Injury Documentation: Pressure Injury 11/16/19 Ankle Left;Posterior Deep Tissue Pressure Injury - Purple or maroon localized area of discolored intact skin or blood-filled blister due to damage of underlying soft tissue from pressure and/or shear. (Active)  11/16/19 0330  Location: Ankle  Location Orientation: Left;Posterior  Staging: Deep Tissue Pressure Injury - Purple or maroon  localized area of discolored intact skin or blood-filled blister due to damage of underlying soft tissue from pressure and/or shear.  Wound Description (Comments):   Present on Admission: Yes    Estimated body mass index is 26.83 kg/m as calculated from the following:   Height as of this encounter: 5\' 6"  (1.676 m).   Weight as of this encounter: 75.4 kg.  DVT prophylaxis: lovenox Family Communication:none Status is: Observation  The patient remains OBS appropriate and will d/c before 2 midnights.  Dispo: The patient is from: Home              Anticipated d/c is to: Home              Anticipated d/c date is: 1 day              Patient currently is not medically stable to d/c.  Code Status:     Code Status Orders  (From admission, onward)         Start     Ordered   11/15/19 2144  Full code  Continuous        11/15/19 2144        Code Status History    Date Active Date Inactive Code Status Order ID Comments User Context   11/08/2018 1454 11/09/2018 1909 Full Code [id number]  Frederik Pear, MD Inpatient   Advance Care Planning Activity        IV Access:    Peripheral IV   Procedures and diagnostic studies:   CT Angio Chest PE W and/or Wo Contrast  Result Date: 11/15/2019 CLINICAL DATA:  Chest and abdominal pain with elevated LFTs EXAM: CT ANGIOGRAPHY CHEST CT ABDOMEN AND PELVIS WITH CONTRAST TECHNIQUE: Multidetector CT imaging of the chest was performed using the standard protocol during bolus administration of intravenous contrast. Multiplanar CT image reconstructions and MIPs were obtained to evaluate the vascular anatomy. Multidetector CT imaging of the abdomen and pelvis was performed using the standard protocol during bolus administration of intravenous contrast. CONTRAST:  15mL OMNIPAQUE IOHEXOL 350 MG/ML SOLN COMPARISON:  06/17/2012 FINDINGS: CTA CHEST FINDINGS Cardiovascular: Thoracic aortic calcifications are noted without aneurysmal dilatation. No cardiac  enlargement is seen. Pulmonary artery shows a normal branching pattern. No filling defects are identified. Coronary calcifications are noted. Mediastinum/Nodes: Thoracic inlet is within normal limits. Multiple prominent mediastinal lymph nodes are noted. The largest of these is a conglomeration of nodes adjacent to the carina measuring 1.9 cm in short axis. 19 mm short axis right hilar lymph node is noted as well. The esophagus is within normal limits. Lungs/Pleura: Mild emphysematous changes are noted in the left lung. The lung is otherwise clear. Right lung demonstrates a rounded soft tissue mass lesion measuring 2.1 x 1.5 cm in the right upper lobe best seen on image number 33 of series 5. Some more peripheral smaller nodular densities are noted as well. No sizable effusion or pneumothorax is noted. Musculoskeletal: Degenerative changes of the thoracic spine are seen. No lytic lesions are noted. Review of the MIP images confirms the above findings. CT ABDOMEN and PELVIS FINDINGS Hepatobiliary: Liver demonstrates multiple peripherally enhancing lesions consistent with metastatic disease. There are too numerous to count although an index lesion in the left lobe measures 2.6 cm in dimension best seen on image number 26 of series 5. Index lesion on the right inferiorly measures approximately 2.8 cm in greatest dimension. Gallbladder is well distended with dependent gallstones. No biliary ductal dilatation is noted. Pancreas: Pancreas again demonstrates a cystic lesion in the tail slightly smaller than that seen on the prior exam now measuring 1.6 cm. Spleen: Normal in size without focal abnormality. Adrenals/Urinary Tract: Adrenal glands demonstrate bilateral hypodense lesions stable in appearance from 2014 consistent with adrenal adenomas. Kidneys demonstrate a normal enhancement pattern. No renal calculi or urinary tract obstructive changes are seen. The bladder is partially distended. Stomach/Bowel: Diverticular  change of the colon is noted. No definitive colonic mass is seen. The appendix is not well visualized and likely has been surgically removed. No small bowel or gastric abnormality is seen. Vascular/Lymphatic: Diffuse vascular calcifications are noted in the abdominal aorta and iliac vessels. No aneurysmal dilatation is seen. Some lymphadenopathy is noted in the gastrohepatic ligament and to a greater degree in the portacaval space and peripancreatic region. The largest of these measures approximately 10 mm in short axis. Reproductive: Uterus has been surgically removed. No adnexal mass is noted. Other: No free pelvic fluid is seen. Musculoskeletal: Degenerative changes of lumbar spine are noted. No compression deformities are seen. Review of the MIP images confirms the above findings. IMPRESSION: CTA of the chest: No evidence of pulmonary emboli. 2.1 cm soft tissue mass lesion in the right upper lung with some peripheral nodularity as well as associated hilar and mediastinal adenopathy. Although this may be related to metastatic disease given the findings in the liver, the possibility of a primary pulmonary neoplasm deserves primary consideration. CT of the abdomen and pelvis: Changes consistent with diffuse hepatic metastatic disease. Tissue sampling is recommended for further evaluation. Associated adenopathy in the upper abdomen is noted as well. Cholelithiasis without  complicating factors. Pancreatic cystic lesion in the tail slightly smaller than that seen on prior exam. Bilateral adrenal adenomas stable from the prior study. Diverticulosis without diverticulitis. Electronically Signed   By: Inez Catalina M.D.   On: 11/15/2019 18:45   CT ABDOMEN PELVIS W CONTRAST  Result Date: 11/15/2019 CLINICAL DATA:  Chest and abdominal pain with elevated LFTs EXAM: CT ANGIOGRAPHY CHEST CT ABDOMEN AND PELVIS WITH CONTRAST TECHNIQUE: Multidetector CT imaging of the chest was performed using the standard protocol during  bolus administration of intravenous contrast. Multiplanar CT image reconstructions and MIPs were obtained to evaluate the vascular anatomy. Multidetector CT imaging of the abdomen and pelvis was performed using the standard protocol during bolus administration of intravenous contrast. CONTRAST:  140mL OMNIPAQUE IOHEXOL 350 MG/ML SOLN COMPARISON:  06/17/2012 FINDINGS: CTA CHEST FINDINGS Cardiovascular: Thoracic aortic calcifications are noted without aneurysmal dilatation. No cardiac enlargement is seen. Pulmonary artery shows a normal branching pattern. No filling defects are identified. Coronary calcifications are noted. Mediastinum/Nodes: Thoracic inlet is within normal limits. Multiple prominent mediastinal lymph nodes are noted. The largest of these is a conglomeration of nodes adjacent to the carina measuring 1.9 cm in short axis. 19 mm short axis right hilar lymph node is noted as well. The esophagus is within normal limits. Lungs/Pleura: Mild emphysematous changes are noted in the left lung. The lung is otherwise clear. Right lung demonstrates a rounded soft tissue mass lesion measuring 2.1 x 1.5 cm in the right upper lobe best seen on image number 33 of series 5. Some more peripheral smaller nodular densities are noted as well. No sizable effusion or pneumothorax is noted. Musculoskeletal: Degenerative changes of the thoracic spine are seen. No lytic lesions are noted. Review of the MIP images confirms the above findings. CT ABDOMEN and PELVIS FINDINGS Hepatobiliary: Liver demonstrates multiple peripherally enhancing lesions consistent with metastatic disease. There are too numerous to count although an index lesion in the left lobe measures 2.6 cm in dimension best seen on image number 26 of series 5. Index lesion on the right inferiorly measures approximately 2.8 cm in greatest dimension. Gallbladder is well distended with dependent gallstones. No biliary ductal dilatation is noted. Pancreas: Pancreas  again demonstrates a cystic lesion in the tail slightly smaller than that seen on the prior exam now measuring 1.6 cm. Spleen: Normal in size without focal abnormality. Adrenals/Urinary Tract: Adrenal glands demonstrate bilateral hypodense lesions stable in appearance from 2014 consistent with adrenal adenomas. Kidneys demonstrate a normal enhancement pattern. No renal calculi or urinary tract obstructive changes are seen. The bladder is partially distended. Stomach/Bowel: Diverticular change of the colon is noted. No definitive colonic mass is seen. The appendix is not well visualized and likely has been surgically removed. No small bowel or gastric abnormality is seen. Vascular/Lymphatic: Diffuse vascular calcifications are noted in the abdominal aorta and iliac vessels. No aneurysmal dilatation is seen. Some lymphadenopathy is noted in the gastrohepatic ligament and to a greater degree in the portacaval space and peripancreatic region. The largest of these measures approximately 10 mm in short axis. Reproductive: Uterus has been surgically removed. No adnexal mass is noted. Other: No free pelvic fluid is seen. Musculoskeletal: Degenerative changes of lumbar spine are noted. No compression deformities are seen. Review of the MIP images confirms the above findings. IMPRESSION: CTA of the chest: No evidence of pulmonary emboli. 2.1 cm soft tissue mass lesion in the right upper lung with some peripheral nodularity as well as associated hilar and mediastinal adenopathy. Although  this may be related to metastatic disease given the findings in the liver, the possibility of a primary pulmonary neoplasm deserves primary consideration. CT of the abdomen and pelvis: Changes consistent with diffuse hepatic metastatic disease. Tissue sampling is recommended for further evaluation. Associated adenopathy in the upper abdomen is noted as well. Cholelithiasis without complicating factors. Pancreatic cystic lesion in the tail  slightly smaller than that seen on prior exam. Bilateral adrenal adenomas stable from the prior study. Diverticulosis without diverticulitis. Electronically Signed   By: Inez Catalina M.D.   On: 11/15/2019 18:45   DG Chest Port 1 View  Result Date: 11/15/2019 CLINICAL DATA:  Dyspnea and tachycardia EXAM: PORTABLE CHEST 1 VIEW COMPARISON:  11/03/2018 FINDINGS: Cardiac shadow is stable. Aortic calcifications are again seen. Lungs are well aerated bilaterally. No focal infiltrate or sizable effusion is seen. No bony abnormality is noted. IMPRESSION: No active disease. Electronically Signed   By: Inez Catalina M.D.   On: 11/15/2019 16:28     Medical Consultants:    None.  Anti-Infectives:   none  Subjective:    NINA MONDOR relates she feels still weeks.  Objective:    Vitals:   11/16/19 0000 11/16/19 0200 11/16/19 0255 11/16/19 0738  BP: (!) 136/53 134/72 (!) 157/70 (!) 153/66  Pulse: 73 78 83 81  Resp: 16 18 16 18   Temp:   98.2 F (36.8 C) 97.8 F (36.6 C)  TempSrc:   Oral Oral  SpO2: 99% 99% 99% 97%  Weight:  75.4 kg    Height:  5\' 6"  (1.676 m)     SpO2: 97 %  No intake or output data in the 24 hours ending 11/16/19 1026 Filed Weights   11/16/19 0200  Weight: 75.4 kg    Exam: General exam: In no acute distress. Respiratory system: Good air movement and clear to auscultation. Cardiovascular system: S1 & S2 heard, RRR. No JVD. Gastrointestinal system: Abdomen is nondistended, soft and nontender.  Central nervous system: Alert and oriented. No focal neurological deficits. Extremities: No pedal edema. Skin: No rashes, lesions or ulcers  Data Reviewed:    Labs: Basic Metabolic Panel: Recent Labs  Lab 11/15/19 1555 11/15/19 1555 11/15/19 1617 11/16/19 0230  NA 134*  --  138 135  K 4.0   < > 4.3 4.2  CL 103  --  105 103  CO2 21*  --   --  23  GLUCOSE 94  --  92 70  BUN 12  --  16 9  CREATININE 0.63  --  0.60 0.60  CALCIUM 10.2  --   --  9.4   < > =  values in this interval not displayed.   GFR Estimated Creatinine Clearance: 51.4 mL/min (by C-G formula based on SCr of 0.6 mg/dL). Liver Function Tests: Recent Labs  Lab 11/15/19 1555  AST 283*  ALT 92*  ALKPHOS 203*  BILITOT 1.2  PROT 9.0*  ALBUMIN 3.0*   Recent Labs  Lab 11/15/19 1555  LIPASE 94*   No results for input(s): AMMONIA in the last 168 hours. Coagulation profile No results for input(s): INR, PROTIME in the last 168 hours. COVID-19 Labs  Recent Labs    11/15/19 1555  DDIMER 7.45*    Lab Results  Component Value Date   SARSCOV2NAA NEGATIVE 11/15/2019   SARSCOV2NAA NOT DETECTED 11/04/2018    CBC: Recent Labs  Lab 11/15/19 1546 11/15/19 1617 11/16/19 0230  WBC 7.0  --  6.7  HGB 15.4* 16.7* 13.8  HCT 47.2* 49.0* 43.9  MCV 91.5  --  93.0  PLT 246  --  220   Cardiac Enzymes: No results for input(s): CKTOTAL, CKMB, CKMBINDEX, TROPONINI in the last 168 hours. BNP (last 3 results) No results for input(s): PROBNP in the last 8760 hours. CBG: No results for input(s): GLUCAP in the last 168 hours. D-Dimer: Recent Labs    11/15/19 1555  DDIMER 7.45*   Hgb A1c: No results for input(s): HGBA1C in the last 72 hours. Lipid Profile: No results for input(s): CHOL, HDL, LDLCALC, TRIG, CHOLHDL, LDLDIRECT in the last 72 hours. Thyroid function studies: Recent Labs    11/15/19 2144  TSH 2.817   Anemia work up: No results for input(s): VITAMINB12, FOLATE, FERRITIN, TIBC, IRON, RETICCTPCT in the last 72 hours. Sepsis Labs: Recent Labs  Lab 11/15/19 1546 11/15/19 1556 11/15/19 1839 11/16/19 0230  WBC 7.0  --   --  6.7  LATICACIDVEN  --  2.3* 1.8  --    Microbiology Recent Results (from the past 240 hour(s))  Culture, blood (routine x 2)     Status: None (Preliminary result)   Collection Time: 11/15/19  4:00 PM   Specimen: BLOOD  Result Value Ref Range Status   Specimen Description BLOOD LEFT ANTECUBITAL  Final   Special Requests   Final     BOTTLES DRAWN AEROBIC AND ANAEROBIC Blood Culture adequate volume   Culture   Final    NO GROWTH < 24 HOURS Performed at [facility], [address], [place], [place] [address]    Report Status PENDING  Incomplete  Culture, blood (routine x 2)     Status: None (Preliminary result)   Collection Time: 11/15/19  6:40 PM   Specimen: BLOOD  Result Value Ref Range Status   Specimen Description BLOOD BLOOD LEFT FOREARM  Final   Special Requests   Final    BOTTLES DRAWN AEROBIC AND ANAEROBIC Blood Culture adequate volume   Culture   Final    NO GROWTH < 24 HOURS Performed at [facility], [place] [address] [place], [place] [address]    Report Status PENDING  Incomplete  Respiratory Panel by RT PCR (Flu A&B, Covid) - Nasopharyngeal Swab     Status: None   Collection Time: 11/15/19  7:26 PM   Specimen: Nasopharyngeal Swab  Result Value Ref Range Status   SARS Coronavirus 2 by RT PCR NEGATIVE NEGATIVE Final    Comment: (NOTE) SARS-CoV-2 target nucleic acids are NOT DETECTED.  The SARS-CoV-2 RNA is generally detectable in upper respiratoy specimens during the acute phase of infection. The lowest concentration of SARS-CoV-2 viral copies this assay can detect is 131 copies/mL. A negative result does not preclude SARS-Cov-2 infection and should not be used as the sole basis for treatment or other patient management decisions. A negative result may occur with  improper specimen collection/handling, submission of specimen other than nasopharyngeal swab, presence of viral mutation(s) within the areas targeted by this assay, and inadequate number of viral copies (<131 copies/mL). A negative result must be combined with clinical observations, patient history, and epidemiological information. The expected result is Negative.  Fact Sheet for Patients:  PinkCheek.be  Fact Sheet for Healthcare Providers:  GravelBags.it  This  test is no t yet approved or cleared by the [place] FDA and  has been authorized for detection and/or diagnosis of SARS-CoV-2 by FDA under an Emergency Use Authorization (EUA). This EUA will remain  in effect (meaning this  test can be used) for the duration of the COVID-19 declaration under Section 564(b)(1) of the Act, 21 U.S.C. section 360bbb-3(b)(1), unless the authorization is terminated or revoked sooner.     Influenza A by PCR NEGATIVE NEGATIVE Final   Influenza B by PCR NEGATIVE NEGATIVE Final    Comment: (NOTE) The Xpert Xpress SARS-CoV-2/FLU/RSV assay is intended as an aid in  the diagnosis of influenza from Nasopharyngeal swab specimens and  should not be used as a sole basis for treatment. Nasal washings and  aspirates are unacceptable for Xpert Xpress SARS-CoV-2/FLU/RSV  testing.  Fact Sheet for Patients: PinkCheek.be  Fact Sheet for Healthcare Providers: GravelBags.it  This test is not yet approved or cleared by the [place] FDA and  has been authorized for detection and/or diagnosis of SARS-CoV-2 by  FDA under an Emergency Use Authorization (EUA). This EUA will remain  in effect (meaning this test can be used) for the duration of the  Covid-19 declaration under Section 564(b)(1) of the Act, 21  U.S.C. section 360bbb-3(b)(1), unless the authorization is  terminated or revoked. Performed at [facility], [place] [address], [place], [place] [address]      Medications:   . anastrozole  1 mg Oral Daily  . enoxaparin (LOVENOX) injection  40 mg Subcutaneous Q24H  . vitamin B-1  250 mg Oral Daily  . vitamin B-12  1,000 mcg Oral Daily   Continuous Infusions:    LOS: 0 days   Charlynne Cousins  [facility]  11/16/2019, 10:26 AM

## 2019-11-16 NOTE — Evaluation (Addendum)
Physical Therapy Evaluation Patient Details Name: Doris Zamora MRN: [id number] DOB: January 06, 1931 Today's Date: 11/16/2019   History of Present Illness  pt is an 86 y/o female with h/o Breast CA, endometrial CA, has lost about 30 LBs unintentinally over 6 weeks admitted from PCP with tachycardia in SVT which spontaneously converted back in the ED.  Clinical Impression  Pt admitted with/for SVT and RVR.  Pt now in sinus rhythm, needing min assist at most for mobility.  Expect steady return to baseline.  Pt currently limited functionally due to the problems listed below.  (see problems list.)  Pt will benefit from PT to maximize function and safety to be able to get home safely with available assist.     Follow Up Recommendations No PT follow up;Other (comment) (likely no follow up.)    Equipment Recommendations  3in1 (PT)    Recommendations for Other Services Rehab consult     Precautions / Restrictions Precautions Precautions: Fall      Mobility  Bed Mobility Overal bed mobility: Needs Assistance Bed Mobility: Supine to Sit     Supine to sit: Min assist;HOB elevated     General bed mobility comments: assisted up via R elbow, but pt used bil UE's to scoot to EOB without assist  Transfers Overall transfer level: Needs assistance   Transfers: Sit to/from Stand Sit to Stand: Min guard            Ambulation/Gait Ambulation/Gait assistance: Min guard;Min assist Gait Distance (Feet): 100 Feet Assistive device: Rolling walker (2 wheeled) Gait Pattern/deviations: Step-through pattern   Gait velocity interpretation: <1.8 ft/sec, indicate of risk for recurrent falls General Gait Details: initially gait showed contralateral hip drop from weaker L LE that smoothed out as she continued.  pt having trouble staying in the RW.  Small mildly unsteady steps.  Stairs            Wheelchair Mobility    Modified Rankin (Stroke Patients Only)       Balance Overall  balance assessment: Needs assistance Sitting-balance support: No upper extremity supported Sitting balance-Leahy Scale: Fair       Standing balance-Leahy Scale: Fair Standing balance comment: preferred use of RW today, but stood at EOB without assist.                             Pertinent Vitals/Pain Pain Assessment: Faces Faces Pain Scale: No hurt Pain Intervention(s): Monitored during session    Home Living Family/patient expects to be discharged to:: Private residence Living Arrangements: Children Available Help at Discharge: Family;Available 24 hours/day Type of Home: House Home Access: Stairs to enter Entrance Stairs-Rails: Psychiatric nurse of Steps: several Home Layout: Multi-level Home Equipment: Environmental consultant - 2 wheels;Cane - single point;Shower seat      Prior Function Level of Independence: Independent with assistive device(s) (in a homelike environment)               Hand Dominance        Extremity/Trunk Assessment   Upper Extremity Assessment Upper Extremity Assessment: Overall WFL for tasks assessed    Lower Extremity Assessment Lower Extremity Assessment: Overall WFL for tasks assessed (L LE mmt weaker than R LE,  mild hip flexor weakness overall)       Communication   Communication: No difficulties  Cognition Arousal/Alertness: Awake/alert Behavior During Therapy: WFL for tasks assessed/performed Overall Cognitive Status: Within Functional Limits for tasks assessed (NT formally)  General Comments General comments (skin integrity, edema, etc.): vss on RA    Exercises     Assessment/Plan    PT Assessment    PT Problem List         PT Treatment Interventions      PT Goals (Current goals can be found in the Care Plan section)  Acute Rehab PT Goals Patient Stated Goal: go home PT Goal Formulation: With patient/family Time For Goal Achievement:  11/23/19 Potential to Achieve Goals: Good    Frequency     Barriers to discharge        Co-evaluation               AM-PAC PT "6 Clicks" Mobility  Outcome Measure Help needed turning from your back to your side while in a flat bed without using bedrails?: A Little Help needed moving from lying on your back to sitting on the side of a flat bed without using bedrails?: A Little Help needed moving to and from a bed to a chair (including a wheelchair)?: A Little Help needed standing up from a chair using your arms (e.g., wheelchair or bedside chair)?: A Little Help needed to walk in hospital room?: A Little Help needed climbing 3-5 steps with a railing? : A Little 6 Click Score: 18    End of Session   Activity Tolerance: Patient tolerated treatment well Patient left: in chair;with call bell/phone within reach;with chair alarm set;with family/visitor present Nurse Communication: Mobility status PT Visit Diagnosis: Unsteadiness on feet (R26.81);Other abnormalities of gait and mobility (R26.89);Muscle weakness (generalized) (M62.81)    Time: 6256-3893 PT Time Calculation (min) (ACUTE ONLY): 32 min   Charges:   PT Evaluation $PT Eval Moderate Complexity: 1 Mod PT Treatments $Gait Training: 8-22 mins        11/16/2019  Ginger Carne., PT Acute Rehabilitation Services [phone]  (pager) [phone]  (office)  Tessie Fass Litzi Binning 11/16/2019, 2:24 PM

## 2019-11-17 ENCOUNTER — Inpatient Hospital Stay (HOSPITAL_COMMUNITY): Payer: Medicare Other

## 2019-11-17 ENCOUNTER — Encounter (HOSPITAL_COMMUNITY): Payer: Self-pay | Admitting: Internal Medicine

## 2019-11-17 DIAGNOSIS — I5030 Unspecified diastolic (congestive) heart failure: Secondary | ICD-10-CM | POA: Diagnosis present

## 2019-11-17 DIAGNOSIS — I471 Supraventricular tachycardia: Secondary | ICD-10-CM | POA: Diagnosis present

## 2019-11-17 DIAGNOSIS — C787 Secondary malignant neoplasm of liver and intrahepatic bile duct: Secondary | ICD-10-CM

## 2019-11-17 DIAGNOSIS — Z833 Family history of diabetes mellitus: Secondary | ICD-10-CM | POA: Diagnosis not present

## 2019-11-17 DIAGNOSIS — L89516 Pressure-induced deep tissue damage of right ankle: Secondary | ICD-10-CM | POA: Diagnosis present

## 2019-11-17 DIAGNOSIS — I361 Nonrheumatic tricuspid (valve) insufficiency: Secondary | ICD-10-CM

## 2019-11-17 DIAGNOSIS — Z515 Encounter for palliative care: Secondary | ICD-10-CM | POA: Diagnosis not present

## 2019-11-17 DIAGNOSIS — Z9842 Cataract extraction status, left eye: Secondary | ICD-10-CM | POA: Diagnosis not present

## 2019-11-17 DIAGNOSIS — I351 Nonrheumatic aortic (valve) insufficiency: Secondary | ICD-10-CM

## 2019-11-17 DIAGNOSIS — H9190 Unspecified hearing loss, unspecified ear: Secondary | ICD-10-CM | POA: Diagnosis present

## 2019-11-17 DIAGNOSIS — Z7189 Other specified counseling: Secondary | ICD-10-CM | POA: Diagnosis not present

## 2019-11-17 DIAGNOSIS — Z79899 Other long term (current) drug therapy: Secondary | ICD-10-CM | POA: Diagnosis not present

## 2019-11-17 DIAGNOSIS — Z9841 Cataract extraction status, right eye: Secondary | ICD-10-CM | POA: Diagnosis not present

## 2019-11-17 DIAGNOSIS — L89526 Pressure-induced deep tissue damage of left ankle: Secondary | ICD-10-CM | POA: Diagnosis present

## 2019-11-17 DIAGNOSIS — Z90710 Acquired absence of both cervix and uterus: Secondary | ICD-10-CM | POA: Diagnosis not present

## 2019-11-17 DIAGNOSIS — E039 Hypothyroidism, unspecified: Secondary | ICD-10-CM | POA: Diagnosis present

## 2019-11-17 DIAGNOSIS — Z87891 Personal history of nicotine dependence: Secondary | ICD-10-CM | POA: Diagnosis not present

## 2019-11-17 DIAGNOSIS — Z20822 Contact with and (suspected) exposure to covid-19: Secondary | ICD-10-CM | POA: Diagnosis present

## 2019-11-17 DIAGNOSIS — Z8542 Personal history of malignant neoplasm of other parts of uterus: Secondary | ICD-10-CM | POA: Diagnosis not present

## 2019-11-17 DIAGNOSIS — Z170 Estrogen receptor positive status [ER+]: Secondary | ICD-10-CM | POA: Diagnosis not present

## 2019-11-17 DIAGNOSIS — E860 Dehydration: Secondary | ICD-10-CM | POA: Diagnosis present

## 2019-11-17 DIAGNOSIS — Z888 Allergy status to other drugs, medicaments and biological substances status: Secondary | ICD-10-CM | POA: Diagnosis not present

## 2019-11-17 DIAGNOSIS — Z96652 Presence of left artificial knee joint: Secondary | ICD-10-CM | POA: Diagnosis present

## 2019-11-17 DIAGNOSIS — C50912 Malignant neoplasm of unspecified site of left female breast: Secondary | ICD-10-CM | POA: Diagnosis present

## 2019-11-17 DIAGNOSIS — Z6826 Body mass index (BMI) 26.0-26.9, adult: Secondary | ICD-10-CM | POA: Diagnosis not present

## 2019-11-17 DIAGNOSIS — I350 Nonrheumatic aortic (valve) stenosis: Secondary | ICD-10-CM

## 2019-11-17 DIAGNOSIS — R531 Weakness: Secondary | ICD-10-CM

## 2019-11-17 DIAGNOSIS — R634 Abnormal weight loss: Secondary | ICD-10-CM | POA: Diagnosis present

## 2019-11-17 DIAGNOSIS — E785 Hyperlipidemia, unspecified: Secondary | ICD-10-CM | POA: Diagnosis present

## 2019-11-17 DIAGNOSIS — F419 Anxiety disorder, unspecified: Secondary | ICD-10-CM | POA: Diagnosis present

## 2019-11-17 LAB — PROTIME-INR
INR: 1.2 (ref 0.8–1.2)
Prothrombin Time: 14.3 seconds (ref 11.4–15.2)

## 2019-11-17 LAB — ECHOCARDIOGRAM COMPLETE
AR max vel: 1.51 cm2
AV Area VTI: 1.65 cm2
AV Area mean vel: 1.76 cm2
AV Mean grad: 9 mmHg
AV Peak grad: 18.4 mmHg
Ao pk vel: 2.15 m/s
Area-P 1/2: 3.91 cm2
Calc EF: 65.1 %
Height: 66 in
P 1/2 time: 403 msec
S' Lateral: 2.1 cm
Single Plane A2C EF: 68.8 %
Single Plane A4C EF: 60.2 %
Weight: 2592.61 oz

## 2019-11-17 MED ORDER — GELATIN ABSORBABLE 12-7 MM EX MISC
CUTANEOUS | Status: AC
  Filled 2019-11-17: qty 1

## 2019-11-17 MED ORDER — LIDOCAINE HCL (PF) 1 % IJ SOLN
INTRAMUSCULAR | Status: AC
  Filled 2019-11-17: qty 30

## 2019-11-17 MED ORDER — MIDAZOLAM HCL 2 MG/2ML IJ SOLN
INTRAMUSCULAR | Status: AC | PRN
  Administered 2019-11-17: 0.5 mg via INTRAVENOUS

## 2019-11-17 MED ORDER — MIDAZOLAM HCL 2 MG/2ML IJ SOLN
INTRAMUSCULAR | Status: AC
  Filled 2019-11-17: qty 2

## 2019-11-17 MED ORDER — SODIUM CHLORIDE 0.9 % IV SOLN
INTRAVENOUS | Status: AC | PRN
  Administered 2019-11-17: 10 mL/h via INTRAVENOUS

## 2019-11-17 MED ORDER — FENTANYL CITRATE (PF) 100 MCG/2ML IJ SOLN
INTRAMUSCULAR | Status: AC
  Filled 2019-11-17: qty 2

## 2019-11-17 MED ORDER — FENTANYL CITRATE (PF) 100 MCG/2ML IJ SOLN
INTRAMUSCULAR | Status: AC | PRN
  Administered 2019-11-17 (×2): 25 ug via INTRAVENOUS

## 2019-11-17 NOTE — Procedures (Signed)
Interventional Radiology Procedure Note  Procedure: US guided biopsy of liver mass, left liver Complications: None EBL: None Recommendations: - Bedrest at least 2 hours.   - Routine wound care - Follow up pathology    Signed,  Corrie Mckusick, DO

## 2019-11-17 NOTE — Progress Notes (Signed)
  Echocardiogram 2D Echocardiogram has been performed.  Doris Zamora 11/17/2019, 3:35 PM

## 2019-11-17 NOTE — Consult Note (Signed)
Consultation Note Date: 11/17/2019   Patient Name: Doris Zamora  DOB: 1930/11/26  MRN: [id number]  Age / Sex: 86 y.o., female  PCP: Biagio Borg, MD Referring Physician: Aileen Fass, Tammi Klippel, MD  Reason for Consultation: Establishing goals of care and Psychosocial/spiritual support  HPI/Patient Profile: 86 y.o. female  with past medical history of left breast cancer 2015 with chemotherapy, endometrial cancer status post hysterectomy in 2007, heartburn, arthritis, hearing loss, seasonal allergies, venous insufficiency, insomnia admitted on 11/15/2019 with SVT that has resolved but imaging suggesting liver/lung metastasis.  She has been having poor appetite and not eating well for the last 2 weeks and has lost weight over the last 6 weeks.  Clinical Assessment and Goals of Care: I have reviewed medical records including EPIC notes, labs and imaging, examined the patient and met at bedside with Doris Zamora to discuss diagnosis prognosis, GOC, EOL wishes, disposition and options. I introduced Palliative Medicine as specialized medical care for people living with serious illness. It focuses on providing relief from the symptoms and stress of a serious illness.   Doris Zamora is resting quietly in bed.  She greets me making and keeping eye contact.  She appears younger than stated age, is alert and oriented, able to make her basic needs known.  There is no family at bedside at this time.  I asked Doris Zamora where she is going, what the doctors have told her, and although she knows that she is going to radiology, she is unable to verbalize that she will have a needle biopsy with her liver.  Once I state this she states acknowledgment stating yes that is what is going to happen.  We discussed a brief life review of the patient.  Doris Zamora lost her spouse in 76.  She tells me that she has 3 children, her daughter, Doris Zamora  if family lives in her home, her son Doris Crumble "Harrie Jeans" is a truck driver and her son Doris Zamora works nights at the post [employer].  As far as functional and nutritional status, Doris Zamora has experienced weight loss over the last 6 weeks.  Her daughter, Doris Zamora lives in her home and assist with housekeeping.  We discussed her current illness and what it means in the larger context of her on-going co-morbidities.  Natural disease trajectory and expectations at EOL were discussed.  We talked about her current diagnosis.  Doris Zamora is aware that she is being checked for cancers.  I attempted to elicit values and goals of care important to the patient.    At this point, Doris Zamora is agreeable to tissue biopsy.  She is also agreeable to meet with oncology to hear a suggested treatment plan.  I reassure her that she decides how we care for her.  Advanced directives, concepts specific to code status, were considered and discussed.  We talked about, "treat the treatable, but allowing natural death".  Doris Zamora states that she has not discussed her end-of-life choices with her children.  I encouraged her to do  so.  She tells me that she believes treating the treatable but allowing a natural death is a right choice for her.  I encouraged her to discuss this with her children before we change CODE STATUS.  Palliative Care services outpatient were explained and offered.  At this point, Doris Zamora is agreeable to outpatient palliative services to follow.  Questions and concerns were addressed.  The family was encouraged to call with questions or concerns.   Conference with attending, bedside nursing staff, transition of care team related to patient condition, needs, goals of care.    HCPOA  NEXT OF KIN -Doris Zamora names her daughter, Doris Zamora as her healthcare surrogate.    SUMMARY OF RECOMMENDATIONS   Continue to treat the treatable. Interested in hearing the treatment plan offered by oncology Home with  home health if needed Outpatient palliative to follow Continue CODE STATUS discussions with family   Code Status/Advance Care Planning:  Full code -we talked about the concept of "treat the treatable, but allowing natural death".  [name] states that this would be her preference, but I encouraged her to share her thoughts with her family before changing [place].  Symptom Management:   Per hospitalist, no additional needs at this time.  Palliative Prophylaxis:   Frequent Pain Assessment and Turn Reposition  Additional Recommendations (Limitations, Scope, Preferences):  Full Scope Treatment -would take chemotherapy if offered  Psycho-social/Spiritual:   Desire for further Chaplaincy support:no  Additional Recommendations: Caregiving  Support/Resources and Education on Hospice  Prognosis:   Unable to determine, based on outcomes, liver biopsy results, treatment plan as offered by oncology  Discharge Planning: Anticipate home with home health if needed      Primary Diagnoses: Present on Admission: . SVT (supraventricular tachycardia) ([place]) . Breast cancer, left breast ([place])   I have reviewed the medical record, interviewed the patient and family, and examined the patient. The following aspects are pertinent.  Past Medical History:  Diagnosis Date  . ANXIETY   . Arthritis   . Endometrial cancer ([place]) 2007  . Hearing loss   . Heartburn    occ  . Hyperglycemia 04/20/2014  . HYPERLIPIDEMIA   . Hypothyroidism   . INSOMNIA-SLEEP DISORDER-UNSPEC   . Peripheral edema   . Seasonal allergies   . Venous insufficiency   . WEIGHT LOSS    Social History   Socioeconomic History  . Marital status: Widowed    Spouse name: Not on file  . Number of children: Not on file  . Years of education: Not on file  . Highest education level: Not on file  Occupational History  . Not on file  Tobacco Use  . Smoking status: Former Smoker    Years: 50.00    Types:  Cigarettes    Quit date: 02/10/1994    Years since quitting: 25.7  . Smokeless tobacco: Never Used  Substance and Sexual Activity  . Alcohol use: Yes    Alcohol/week: 7.0 standard drinks    Types: 7 Glasses of wine per week    Comment: 4 0z. HS to help her sleep  . Drug use: No    Comment: admiited to poat use of marjuana to help her sleep  . Sexual activity: Not on file  Other Topics Concern  . Not on file  Social History Narrative         Widow. 3 children - 2 living. retired Dance movement psychotherapist Strain:   .  Difficulty of Paying Living Expenses: Not on file  Food Insecurity:   . Worried About Charity fundraiser in the Last Year: Not on file  . Ran Out of Food in the Last Year: Not on file  Transportation Needs:   . Lack of Transportation (Medical): Not on file  . Lack of Transportation (Non-Medical): Not on file  Physical Activity:   . Days of Exercise per Week: Not on file  . Minutes of Exercise per Session: Not on file  Stress:   . Feeling of Stress : Not on file  Social Connections:   . Frequency of Communication with Friends and Family: Not on file  . Frequency of Social Gatherings with Friends and Family: Not on file  . Attends Religious Services: Not on file  . Active Member of Clubs or Organizations: Not on file  . Attends Archivist Meetings: Not on file  . Marital Status: Not on file   Family History  Problem Relation Age of Onset  . Diabetes Mother   . Muscular dystrophy Father   . Cancer Other 40       breast cancer   . Cancer Sister 61       pancreatic cancer    Scheduled Meds: . anastrozole  1 mg Oral Daily  . enoxaparin (LOVENOX) injection  40 mg Subcutaneous Q24H  . fentaNYL      . gelatin adsorbable      . lidocaine (PF)      . metoprolol tartrate  25 mg Oral BID  . midazolam      . vitamin B-1  250 mg Oral Daily  . vitamin B-12  1,000 mcg Oral Daily   Continuous Infusions: PRN  Meds:.acetaminophen **OR** acetaminophen, ALPRAZolam, hydrocortisone cream, ondansetron **OR** ondansetron (ZOFRAN) IV Medications Prior to Admission:  Prior to Admission medications   Medication Sig Start Date End Date Taking? Authorizing Provider  ALPRAZolam (XANAX) 0.5 MG tablet TAKE 1 TABLET(0.5 MG) BY MOUTH DAILY AS NEEDED Patient taking differently: Take 0.5 mg by mouth at bedtime as needed for anxiety.  11/04/19  Yes Biagio Borg, MD  anastrozole (ARIMIDEX) 1 MG tablet TAKE 1 TABLET(1 MG) BY MOUTH DAILY Patient taking differently: Take 1 mg by mouth daily.  09/12/19  Yes Truitt Merle, MD  BLACK CURRANT SEED OIL PO Take 1 tablet by mouth daily.    Yes [provider]  cetirizine (ZYRTEC) 10 MG tablet TAKE 1 TABLET(10 MG) BY MOUTH DAILY Patient taking differently: Take 10 mg by mouth daily.  08/16/18  Yes Biagio Borg, MD  cholecalciferol (VITAMIN D) 1000 UNITS tablet Take 1,000 Units by mouth daily.    Yes [provider]  Coenzyme Q10 (COQ10) 100 MG CAPS Take 100 mg by mouth daily.   Yes [provider]  fluticasone (FLONASE) 50 MCG/ACT nasal spray Place 2 sprays into both nostrils daily. 06/05/17  Yes Biagio Borg, MD  Krill Oil 350 MG CAPS Take 350 mg by mouth daily.   Yes [provider]  Menthol, Topical Analgesic, (BIOFREEZE EX) Apply 1 patch topically daily as needed (pain).   Yes [provider]  OVER THE COUNTER MEDICATION Take 1 tablet by mouth daily. Instaflex   Yes [provider]  potassium chloride (KLOR-CON) 10 MEQ tablet TAKE 1 TABLET BY MOUTH EVERY DAY AS NEEDED FOR WITH USE OF LASIX Patient taking differently: Take 10 mEq by mouth daily.  06/06/19  Yes Biagio Borg, MD  Thiamine HCl (VITAMIN  B-1) 250 MG tablet Take 250 mg by mouth daily.   Yes [provider]  traMADol (ULTRAM) 50 MG tablet Take 1 tablet (50 mg total) by mouth every 6 (six) hours as needed. Patient taking differently: Take 50 mg by mouth every 6  (six) hours as needed for moderate pain.  10/05/19  Yes Draper, Christia Reading R, DO  vitamin B-12 (CYANOCOBALAMIN) 1000 MCG tablet Take 1,000 mcg by mouth daily.   Yes [provider]  vitamin E 400 UNIT capsule Take 400 Units by mouth daily.   Yes [provider]   Allergies  Allergen Reactions  . Synthroid [Levothyroxine Sodium] Swelling    Happened yrs ago  . Pneumovax [Pneumococcal Polysaccharide Vaccine] Swelling   Review of Systems  Unable to perform ROS: Age    Physical Exam Vitals and nursing note reviewed.  Constitutional:      General: She is not in acute distress.    Appearance: She is ill-appearing.  HENT:     Head: Normocephalic and atraumatic.     Mouth/Throat:     Mouth: Mucous membranes are moist.  Cardiovascular:     Rate and Rhythm: Normal rate.  Pulmonary:     Effort: Pulmonary effort is normal. No respiratory distress.  Abdominal:     General: Abdomen is flat.     Tenderness: There is no guarding.  Skin:    General: Skin is warm and dry.  Neurological:     Mental Status: She is alert and oriented to person, place, and time.  Psychiatric:        Mood and Affect: Mood normal.        Behavior: Behavior normal.     Vital Signs: BP (!) 127/54 (BP Location: Right Arm)   Pulse 66   Temp 97.8 F (36.6 C) (Oral)   Resp 18   Ht 5' 6"  (1.676 m)   Wt 73.5 kg   SpO2 93%   BMI 26.15 kg/m  Pain Scale: 0-10   Pain Score: 0-No pain   SpO2: SpO2: 93 % O2 Device:SpO2: 93 % O2 Flow Rate: .O2 Flow Rate (L/min): 2 L/min  IO: Intake/output summary:   Intake/Output Summary (Last 24 hours) at 11/17/2019 1453 Last data filed at 11/17/2019 0844 Gross per 24 hour  Intake 0 ml  Output 200 ml  Net -200 ml    LBM: Last BM Date: 11/16/19 (per patinent) Baseline Weight: Weight: 75.4 kg Most recent weight: Weight: 73.5 kg     Palliative Assessment/Data:   Flowsheet Rows     Most Recent Value  Intake Tab  Referral Department Hospitalist  Unit  at Time of Referral Cardiac/Telemetry Unit  Palliative Care Primary Diagnosis Cancer  Date Notified 11/16/19  Palliative Care Type New Palliative care  Reason for referral Clarify Goals of Care  Date of Admission 11/15/19  Date first seen by Palliative Care 11/17/19  # of days Palliative referral response time 1 Day(s)  # of days IP prior to Palliative referral 1  Clinical Assessment  Palliative Performance Scale Score 40%  Pain Max last 24 hours Not able to report  Pain Min Last 24 hours Not able to report  Dyspnea Max Last 24 Hours Not able to report  Dyspnea Min Last 24 hours Not able to report  Psychosocial & Spiritual Assessment  Palliative Care Outcomes      Time In: 1010  Time Out: 1120  Time Total: 70 minutes  Greater than 50%  of this time was spent counseling  and coordinating care related to the above assessment and plan.  Signed by: Drue Novel, NP   Please contact Palliative Medicine Team phone at [phone] for questions and concerns.  For individual provider: See Shea Evans

## 2019-11-17 NOTE — Progress Notes (Signed)
[facility] PROGRESS NOTE    Progress Note  Doris Zamora  IEP:[id number] DOB: 1930-10-31 DOA: 11/15/2019 PCP: Biagio Borg, MD     Brief Narrative:   Doris Zamora is an 86 y.o. female past medical history of breast cancer on anastrozole declined mastectomy, history of endometrial cancer status post hysterectomy has had a poor appetite and not eating well for the past 2 weeks which has lost a considerable amount of weight over the last weeks.  Went to see the PCP on the day of admission was found to be tachycardic referred to the ED and found to be in SVT which spontaneously converted back.  CT angio of the chest and abdomen was done that showed a right upper lung lesion and adenopathy along with liver lesions concerning for metastatic disease.  Assessment/Plan:   SVT (supraventricular tachycardia) ([place]): Spontaneously converted to sinus rhythm. Continue oral metoprolol appreciate cardiology's assistance. 2D echo is pending.  Generalized weakness: Multifactorial in the setting of metastatic disease and SVT.  Metastatic stage IV breast cancer: Palliative care has been consulted awaiting recommendations probably the patient would like to move towards comfort care. Oncology was consulted and they recommended an ultrasound-guided biopsy. IR has been consulted scheduled on 11/17/2019.  History of endometrial cancer: Status post hysterectomy.  RN Pressure Injury Documentation: Pressure Injury 11/16/19 Ankle Left;Posterior Deep Tissue Pressure Injury - Purple or maroon localized area of discolored intact skin or blood-filled blister due to damage of underlying soft tissue from pressure and/or shear. (Active)  11/16/19 0330  Location: Ankle  Location Orientation: Left;Posterior  Staging: Deep Tissue Pressure Injury - Purple or maroon localized area of discolored intact skin or blood-filled blister due to damage of underlying soft tissue from pressure and/or shear.  Wound  Description (Comments):   Present on Admission: Yes    Estimated body mass index is 26.15 kg/m as calculated from the following:   Height as of this encounter: 5\' 6"  (1.676 m).   Weight as of this encounter: 73.5 kg.  DVT prophylaxis: lovenox Family Communication:none Status is: Observation  The patient remains OBS appropriate and will d/c before 2 midnights.  Dispo: The patient is from: Home              Anticipated d/c is to: Home              Anticipated d/c date is: 1 day              Patient currently is not medically stable to d/c.  Code Status:     Code Status Orders  (From admission, onward)         Start     Ordered   11/15/19 2144  Full code  Continuous        11/15/19 2144        Code Status History    Date Active Date Inactive Code Status Order ID Comments User Context   11/08/2018 1454 11/09/2018 1909 Full Code [id number]  Frederik Pear, MD Inpatient   Advance Care Planning Activity        IV Access:    Peripheral IV   Procedures and diagnostic studies:   CT Angio Chest PE W and/or Wo Contrast  Result Date: 11/15/2019 CLINICAL DATA:  Chest and abdominal pain with elevated LFTs EXAM: CT ANGIOGRAPHY CHEST CT ABDOMEN AND PELVIS WITH CONTRAST TECHNIQUE: Multidetector CT imaging of the chest was performed using the standard protocol during bolus administration of intravenous contrast. Multiplanar CT image reconstructions  and MIPs were obtained to evaluate the vascular anatomy. Multidetector CT imaging of the abdomen and pelvis was performed using the standard protocol during bolus administration of intravenous contrast. CONTRAST:  130mL OMNIPAQUE IOHEXOL 350 MG/ML SOLN COMPARISON:  06/17/2012 FINDINGS: CTA CHEST FINDINGS Cardiovascular: Thoracic aortic calcifications are noted without aneurysmal dilatation. No cardiac enlargement is seen. Pulmonary artery shows a normal branching pattern. No filling defects are identified. Coronary calcifications are noted.  Mediastinum/Nodes: Thoracic inlet is within normal limits. Multiple prominent mediastinal lymph nodes are noted. The largest of these is a conglomeration of nodes adjacent to the carina measuring 1.9 cm in short axis. 19 mm short axis right hilar lymph node is noted as well. The esophagus is within normal limits. Lungs/Pleura: Mild emphysematous changes are noted in the left lung. The lung is otherwise clear. Right lung demonstrates a rounded soft tissue mass lesion measuring 2.1 x 1.5 cm in the right upper lobe best seen on image number 33 of series 5. Some more peripheral smaller nodular densities are noted as well. No sizable effusion or pneumothorax is noted. Musculoskeletal: Degenerative changes of the thoracic spine are seen. No lytic lesions are noted. Review of the MIP images confirms the above findings. CT ABDOMEN and PELVIS FINDINGS Hepatobiliary: Liver demonstrates multiple peripherally enhancing lesions consistent with metastatic disease. There are too numerous to count although an index lesion in the left lobe measures 2.6 cm in dimension best seen on image number 26 of series 5. Index lesion on the right inferiorly measures approximately 2.8 cm in greatest dimension. Gallbladder is well distended with dependent gallstones. No biliary ductal dilatation is noted. Pancreas: Pancreas again demonstrates a cystic lesion in the tail slightly smaller than that seen on the prior exam now measuring 1.6 cm. Spleen: Normal in size without focal abnormality. Adrenals/Urinary Tract: Adrenal glands demonstrate bilateral hypodense lesions stable in appearance from 2014 consistent with adrenal adenomas. Kidneys demonstrate a normal enhancement pattern. No renal calculi or urinary tract obstructive changes are seen. The bladder is partially distended. Stomach/Bowel: Diverticular change of the colon is noted. No definitive colonic mass is seen. The appendix is not well visualized and likely has been surgically removed.  No small bowel or gastric abnormality is seen. Vascular/Lymphatic: Diffuse vascular calcifications are noted in the abdominal aorta and iliac vessels. No aneurysmal dilatation is seen. Some lymphadenopathy is noted in the gastrohepatic ligament and to a greater degree in the portacaval space and peripancreatic region. The largest of these measures approximately 10 mm in short axis. Reproductive: Uterus has been surgically removed. No adnexal mass is noted. Other: No free pelvic fluid is seen. Musculoskeletal: Degenerative changes of lumbar spine are noted. No compression deformities are seen. Review of the MIP images confirms the above findings. IMPRESSION: CTA of the chest: No evidence of pulmonary emboli. 2.1 cm soft tissue mass lesion in the right upper lung with some peripheral nodularity as well as associated hilar and mediastinal adenopathy. Although this may be related to metastatic disease given the findings in the liver, the possibility of a primary pulmonary neoplasm deserves primary consideration. CT of the abdomen and pelvis: Changes consistent with diffuse hepatic metastatic disease. Tissue sampling is recommended for further evaluation. Associated adenopathy in the upper abdomen is noted as well. Cholelithiasis without complicating factors. Pancreatic cystic lesion in the tail slightly smaller than that seen on prior exam. Bilateral adrenal adenomas stable from the prior study. Diverticulosis without diverticulitis. Electronically Signed   By: Inez Catalina M.D.   On: 11/15/2019  18:45   CT ABDOMEN PELVIS W CONTRAST  Result Date: 11/15/2019 CLINICAL DATA:  Chest and abdominal pain with elevated LFTs EXAM: CT ANGIOGRAPHY CHEST CT ABDOMEN AND PELVIS WITH CONTRAST TECHNIQUE: Multidetector CT imaging of the chest was performed using the standard protocol during bolus administration of intravenous contrast. Multiplanar CT image reconstructions and MIPs were obtained to evaluate the vascular anatomy.  Multidetector CT imaging of the abdomen and pelvis was performed using the standard protocol during bolus administration of intravenous contrast. CONTRAST:  162mL OMNIPAQUE IOHEXOL 350 MG/ML SOLN COMPARISON:  06/17/2012 FINDINGS: CTA CHEST FINDINGS Cardiovascular: Thoracic aortic calcifications are noted without aneurysmal dilatation. No cardiac enlargement is seen. Pulmonary artery shows a normal branching pattern. No filling defects are identified. Coronary calcifications are noted. Mediastinum/Nodes: Thoracic inlet is within normal limits. Multiple prominent mediastinal lymph nodes are noted. The largest of these is a conglomeration of nodes adjacent to the carina measuring 1.9 cm in short axis. 19 mm short axis right hilar lymph node is noted as well. The esophagus is within normal limits. Lungs/Pleura: Mild emphysematous changes are noted in the left lung. The lung is otherwise clear. Right lung demonstrates a rounded soft tissue mass lesion measuring 2.1 x 1.5 cm in the right upper lobe best seen on image number 33 of series 5. Some more peripheral smaller nodular densities are noted as well. No sizable effusion or pneumothorax is noted. Musculoskeletal: Degenerative changes of the thoracic spine are seen. No lytic lesions are noted. Review of the MIP images confirms the above findings. CT ABDOMEN and PELVIS FINDINGS Hepatobiliary: Liver demonstrates multiple peripherally enhancing lesions consistent with metastatic disease. There are too numerous to count although an index lesion in the left lobe measures 2.6 cm in dimension best seen on image number 26 of series 5. Index lesion on the right inferiorly measures approximately 2.8 cm in greatest dimension. Gallbladder is well distended with dependent gallstones. No biliary ductal dilatation is noted. Pancreas: Pancreas again demonstrates a cystic lesion in the tail slightly smaller than that seen on the prior exam now measuring 1.6 cm. Spleen: Normal in size  without focal abnormality. Adrenals/Urinary Tract: Adrenal glands demonstrate bilateral hypodense lesions stable in appearance from 2014 consistent with adrenal adenomas. Kidneys demonstrate a normal enhancement pattern. No renal calculi or urinary tract obstructive changes are seen. The bladder is partially distended. Stomach/Bowel: Diverticular change of the colon is noted. No definitive colonic mass is seen. The appendix is not well visualized and likely has been surgically removed. No small bowel or gastric abnormality is seen. Vascular/Lymphatic: Diffuse vascular calcifications are noted in the abdominal aorta and iliac vessels. No aneurysmal dilatation is seen. Some lymphadenopathy is noted in the gastrohepatic ligament and to a greater degree in the portacaval space and peripancreatic region. The largest of these measures approximately 10 mm in short axis. Reproductive: Uterus has been surgically removed. No adnexal mass is noted. Other: No free pelvic fluid is seen. Musculoskeletal: Degenerative changes of lumbar spine are noted. No compression deformities are seen. Review of the MIP images confirms the above findings. IMPRESSION: CTA of the chest: No evidence of pulmonary emboli. 2.1 cm soft tissue mass lesion in the right upper lung with some peripheral nodularity as well as associated hilar and mediastinal adenopathy. Although this may be related to metastatic disease given the findings in the liver, the possibility of a primary pulmonary neoplasm deserves primary consideration. CT of the abdomen and pelvis: Changes consistent with diffuse hepatic metastatic disease. Tissue sampling is recommended  for further evaluation. Associated adenopathy in the upper abdomen is noted as well. Cholelithiasis without complicating factors. Pancreatic cystic lesion in the tail slightly smaller than that seen on prior exam. Bilateral adrenal adenomas stable from the prior study. Diverticulosis without diverticulitis.  Electronically Signed   By: Inez Catalina M.D.   On: 11/15/2019 18:45   DG Chest Port 1 View  Result Date: 11/15/2019 CLINICAL DATA:  Dyspnea and tachycardia EXAM: PORTABLE CHEST 1 VIEW COMPARISON:  11/03/2018 FINDINGS: Cardiac shadow is stable. Aortic calcifications are again seen. Lungs are well aerated bilaterally. No focal infiltrate or sizable effusion is seen. No bony abnormality is noted. IMPRESSION: No active disease. Electronically Signed   By: Inez Catalina M.D.   On: 11/15/2019 16:28     Medical Consultants:    None.  Anti-Infectives:   none  Subjective:    Burnis Kingfisher no new complaints.  Objective:    Vitals:   11/16/19 1631 11/16/19 1952 11/17/19 0326 11/17/19 0810  BP: (!) 142/62 (!) 141/70 (!) 156/54 (!) 146/63  Pulse: 66 73 69 73  Resp: 18 18 18 20   Temp: 97.7 F (36.5 C) 98 F (36.7 C) 97.9 F (36.6 C) 98.3 F (36.8 C)  TempSrc: Oral Oral Oral Oral  SpO2: 99% 98% 100% 98%  Weight:   73.5 kg   Height:       SpO2: 98 %   Intake/Output Summary (Last 24 hours) at 11/17/2019 0831 Last data filed at 11/17/2019 0600 Gross per 24 hour  Intake 460 ml  Output 200 ml  Net 260 ml   Filed Weights   11/16/19 0200 11/17/19 0326  Weight: 75.4 kg 73.5 kg    Exam: General exam: In no acute distress. Respiratory system: Good air movement and clear to auscultation. Cardiovascular system: S1 & S2 heard, RRR. No JVD. Gastrointestinal system: Abdomen is nondistended, soft and nontender.  Extremities: No pedal edema. Skin: No rashes, lesions or ulcers  Data Reviewed:    Labs: Basic Metabolic Panel: Recent Labs  Lab 11/15/19 1555 11/15/19 1555 11/15/19 1617 11/16/19 0230  NA 134*  --  138 135  K 4.0   < > 4.3 4.2  CL 103  --  105 103  CO2 21*  --   --  23  GLUCOSE 94  --  92 70  BUN 12  --  16 9  CREATININE 0.63  --  0.60 0.60  CALCIUM 10.2  --   --  9.4   < > = values in this interval not displayed.   GFR Estimated Creatinine Clearance:  50.8 mL/min (by C-G formula based on SCr of 0.6 mg/dL). Liver Function Tests: Recent Labs  Lab 11/15/19 1555  AST 283*  ALT 92*  ALKPHOS 203*  BILITOT 1.2  PROT 9.0*  ALBUMIN 3.0*   Recent Labs  Lab 11/15/19 1555  LIPASE 94*   No results for input(s): AMMONIA in the last 168 hours. Coagulation profile No results for input(s): INR, PROTIME in the last 168 hours. COVID-19 Labs  Recent Labs    11/15/19 1555  DDIMER 7.45*    Lab Results  Component Value Date   SARSCOV2NAA NEGATIVE 11/15/2019   SARSCOV2NAA NOT DETECTED 11/04/2018    CBC: Recent Labs  Lab 11/15/19 1546 11/15/19 1617 11/16/19 0230  WBC 7.0  --  6.7  HGB 15.4* 16.7* 13.8  HCT 47.2* 49.0* 43.9  MCV 91.5  --  93.0  PLT 246  --  220   Cardiac Enzymes:  No results for input(s): CKTOTAL, CKMB, CKMBINDEX, TROPONINI in the last 168 hours. BNP (last 3 results) No results for input(s): PROBNP in the last 8760 hours. CBG: No results for input(s): GLUCAP in the last 168 hours. D-Dimer: Recent Labs    11/15/19 1555  DDIMER 7.45*   Hgb A1c: No results for input(s): HGBA1C in the last 72 hours. Lipid Profile: No results for input(s): CHOL, HDL, LDLCALC, TRIG, CHOLHDL, LDLDIRECT in the last 72 hours. Thyroid function studies: Recent Labs    11/15/19 2144  TSH 2.817   Anemia work up: No results for input(s): VITAMINB12, FOLATE, FERRITIN, TIBC, IRON, RETICCTPCT in the last 72 hours. Sepsis Labs: Recent Labs  Lab 11/15/19 1546 11/15/19 1556 11/15/19 1839 11/16/19 0230  WBC 7.0  --   --  6.7  LATICACIDVEN  --  2.3* 1.8  --    Microbiology Recent Results (from the past 240 hour(s))  Culture, blood (routine x 2)     Status: None (Preliminary result)   Collection Time: 11/15/19  4:00 PM   Specimen: BLOOD  Result Value Ref Range Status   Specimen Description BLOOD LEFT ANTECUBITAL  Final   Special Requests   Final    BOTTLES DRAWN AEROBIC AND ANAEROBIC Blood Culture adequate volume   Culture    Final    NO GROWTH < 24 HOURS Performed at [facility], [address], [place], [place] [address]    Report Status PENDING  Incomplete  Culture, blood (routine x 2)     Status: None (Preliminary result)   Collection Time: 11/15/19  6:40 PM   Specimen: BLOOD  Result Value Ref Range Status   Specimen Description BLOOD BLOOD LEFT FOREARM  Final   Special Requests   Final    BOTTLES DRAWN AEROBIC AND ANAEROBIC Blood Culture adequate volume   Culture   Final    NO GROWTH < 24 HOURS Performed at [facility], [place] [address], [place], [place] [address]    Report Status PENDING  Incomplete  Respiratory Panel by RT PCR (Flu A&B, Covid) - Nasopharyngeal Swab     Status: None   Collection Time: 11/15/19  7:26 PM   Specimen: Nasopharyngeal Swab  Result Value Ref Range Status   SARS Coronavirus 2 by RT PCR NEGATIVE NEGATIVE Final    Comment: (NOTE) SARS-CoV-2 target nucleic acids are NOT DETECTED.  The SARS-CoV-2 RNA is generally detectable in upper respiratoy specimens during the acute phase of infection. The lowest concentration of SARS-CoV-2 viral copies this assay can detect is 131 copies/mL. A negative result does not preclude SARS-Cov-2 infection and should not be used as the sole basis for treatment or other patient management decisions. A negative result may occur with  improper specimen collection/handling, submission of specimen other than nasopharyngeal swab, presence of viral mutation(s) within the areas targeted by this assay, and inadequate number of viral copies (<131 copies/mL). A negative result must be combined with clinical observations, patient history, and epidemiological information. The expected result is Negative.  Fact Sheet for Patients:  PinkCheek.be  Fact Sheet for Healthcare Providers:  GravelBags.it  This test is no t yet approved or cleared by the [place] FDA and  has been  authorized for detection and/or diagnosis of SARS-CoV-2 by FDA under an Emergency Use Authorization (EUA). This EUA will remain  in effect (meaning this test can be used) for the duration of the COVID-19 declaration under Section 564(b)(1) of the Act, 21 U.S.C. section 360bbb-3(b)(1), unless  the authorization is terminated or revoked sooner.     Influenza A by PCR NEGATIVE NEGATIVE Final   Influenza B by PCR NEGATIVE NEGATIVE Final    Comment: (NOTE) The Xpert Xpress SARS-CoV-2/FLU/RSV assay is intended as an aid in  the diagnosis of influenza from Nasopharyngeal swab specimens and  should not be used as a sole basis for treatment. Nasal washings and  aspirates are unacceptable for Xpert Xpress SARS-CoV-2/FLU/RSV  testing.  Fact Sheet for Patients: PinkCheek.be  Fact Sheet for Healthcare Providers: GravelBags.it  This test is not yet approved or cleared by the [place] FDA and  has been authorized for detection and/or diagnosis of SARS-CoV-2 by  FDA under an Emergency Use Authorization (EUA). This EUA will remain  in effect (meaning this test can be used) for the duration of the  Covid-19 declaration under Section 564(b)(1) of the Act, 21  U.S.C. section 360bbb-3(b)(1), unless the authorization is  terminated or revoked. Performed at [facility], [place] [address] [place], [place] [address]      Medications:   . anastrozole  1 mg Oral Daily  . enoxaparin (LOVENOX) injection  40 mg Subcutaneous Q24H  . metoprolol tartrate  25 mg Oral BID  . vitamin B-1  250 mg Oral Daily  . vitamin B-12  1,000 mcg Oral Daily   Continuous Infusions:    LOS: 0 days   Charlynne Cousins  [facility]  11/17/2019, 8:31 AM

## 2019-11-18 LAB — SURGICAL PATHOLOGY

## 2019-11-18 MED ORDER — METOPROLOL TARTRATE 25 MG PO TABS
25.0000 mg | ORAL_TABLET | Freq: Two times a day (BID) | ORAL | 2 refills | Status: DC
Start: 2019-11-18 — End: 2019-11-29

## 2019-11-18 NOTE — Discharge Summary (Signed)
Physician Discharge Summary  Doris Zamora NLZ:[id number] DOB: [birth year] DOA: 11/15/2019  PCP: Biagio Borg, MD  Admit date: 11/15/2019 Discharge date: 11/18/2019  Admitted From: Home Disposition:  Home  Recommendations for Outpatient Follow-up:  1. Follow up with Oncology in 1-2 weeks 2. Please obtain BMP/CBC in one week   Home Health:Yes Equipment/Devices:None  Discharge Condition:Stable CODE STATUS:Full Diet recommendation: Heart Healthy    Brief/Interim Summary: 86 y.o. female past medical history of breast cancer on anastrozole declined mastectomy, history of endometrial cancer status post hysterectomy has had a poor appetite and not eating well for the past 2 weeks which has lost a considerable amount of weight over the last weeks.  Went to see the PCP on the day of admission was found to be tachycardic referred to the ED and found to be in SVT which spontaneously converted back.  CT angio of the chest and abdomen was done that showed a right upper lung lesion and adenopathy along with liver lesions concerning for metastatic disease.  Discharge Diagnoses:  Principal Problem:   SVT (supraventricular tachycardia) (HCC) Active Problems:   Breast cancer, left breast (HCC)   Generalized weakness   General weakness   Liver metastases (HCC)   Goals of care, counseling/discussion   Palliative care by specialist   DNR (do not resuscitate) discussion  SVT: She has been turned is converted to sinus rhythm she was started on metoprolol cardiology was consulted which agreed with plan. A 2D echo was done which showed EF 60% and grade 1 diastolic heart failure.  Generalized weakness: Multifactorial in the setting of metastatic disease and SVT.  Metastatic stage IV breast cancer ago Palliative care was consulted they decided to continue current treatment and plan offered by oncology.  Liver biopsy was done on 11/17/2019 and discussed results with oncology as an  outpatient. Palliative care to continue to follow-up as an outpatient. Physical therapy evaluated the patient the recommended home health PT.  Discharge Instructions  Discharge Instructions    Diet - low sodium heart healthy   Complete by: As directed    Increase activity slowly   Complete by: As directed    No wound care   Complete by: As directed      Allergies as of 11/18/2019      Reactions   Synthroid [levothyroxine Sodium] Swelling   Happened yrs ago   Pneumovax [pneumococcal Polysaccharide Vaccine] Swelling      Medication List    TAKE these medications   ALPRAZolam 0.5 MG tablet Commonly known as: XANAX TAKE 1 TABLET(0.5 MG) BY MOUTH DAILY AS NEEDED What changed: See the new instructions.   anastrozole 1 MG tablet Commonly known as: ARIMIDEX TAKE 1 TABLET(1 MG) BY MOUTH DAILY What changed: See the new instructions.   BIOFREEZE EX Apply 1 patch topically daily as needed (pain).   BLACK CURRANT SEED OIL PO Take 1 tablet by mouth daily.   cetirizine 10 MG tablet Commonly known as: ZYRTEC TAKE 1 TABLET(10 MG) BY MOUTH DAILY What changed: See the new instructions.   cholecalciferol 1000 units tablet Commonly known as: VITAMIN D Take 1,000 Units by mouth daily.   CoQ10 100 MG Caps Take 100 mg by mouth daily.   fluticasone 50 MCG/ACT nasal spray Commonly known as: Flonase Place 2 sprays into both nostrils daily.   Krill Oil 350 MG Caps Take 350 mg by mouth daily.   metoprolol tartrate 25 MG tablet Commonly known as: LOPRESSOR Take 1 tablet (25 mg total) by  mouth 2 (two) times daily.   OVER THE COUNTER MEDICATION Take 1 tablet by mouth daily. Instaflex   potassium chloride 10 MEQ tablet Commonly known as: KLOR-CON TAKE 1 TABLET BY MOUTH EVERY DAY AS NEEDED FOR WITH USE OF LASIX What changed:   how much to take  how to take this  when to take this  additional instructions   traMADol 50 MG tablet Commonly known as: ULTRAM Take 1 tablet  (50 mg total) by mouth every 6 (six) hours as needed. What changed: reasons to take this   vitamin B-1 250 MG tablet Take 250 mg by mouth daily.   vitamin B-12 1000 MCG tablet Commonly known as: CYANOCOBALAMIN Take 1,000 mcg by mouth daily.   vitamin E 180 MG (400 UNITS) capsule Take 400 Units by mouth daily.       Allergies  Allergen Reactions  . Synthroid [Levothyroxine Sodium] Swelling    Happened yrs ago  . Pneumovax [Pneumococcal Polysaccharide Vaccine] Swelling    Consultations:  Oncology.   Procedures/Studies: CT Angio Chest PE W and/or Wo Contrast  Result Date: 11/15/2019 CLINICAL DATA:  Chest and abdominal pain with elevated LFTs EXAM: CT ANGIOGRAPHY CHEST CT ABDOMEN AND PELVIS WITH CONTRAST TECHNIQUE: Multidetector CT imaging of the chest was performed using the standard protocol during bolus administration of intravenous contrast. Multiplanar CT image reconstructions and MIPs were obtained to evaluate the vascular anatomy. Multidetector CT imaging of the abdomen and pelvis was performed using the standard protocol during bolus administration of intravenous contrast. CONTRAST:  11mL OMNIPAQUE IOHEXOL 350 MG/ML SOLN COMPARISON:  06/17/2012 FINDINGS: CTA CHEST FINDINGS Cardiovascular: Thoracic aortic calcifications are noted without aneurysmal dilatation. No cardiac enlargement is seen. Pulmonary artery shows a normal branching pattern. No filling defects are identified. Coronary calcifications are noted. Mediastinum/Nodes: Thoracic inlet is within normal limits. Multiple prominent mediastinal lymph nodes are noted. The largest of these is a conglomeration of nodes adjacent to the carina measuring 1.9 cm in short axis. 19 mm short axis right hilar lymph node is noted as well. The esophagus is within normal limits. Lungs/Pleura: Mild emphysematous changes are noted in the left lung. The lung is otherwise clear. Right lung demonstrates a rounded soft tissue mass lesion measuring  2.1 x 1.5 cm in the right upper lobe best seen on image number 33 of series 5. Some more peripheral smaller nodular densities are noted as well. No sizable effusion or pneumothorax is noted. Musculoskeletal: Degenerative changes of the thoracic spine are seen. No lytic lesions are noted. Review of the MIP images confirms the above findings. CT ABDOMEN and PELVIS FINDINGS Hepatobiliary: Liver demonstrates multiple peripherally enhancing lesions consistent with metastatic disease. There are too numerous to count although an index lesion in the left lobe measures 2.6 cm in dimension best seen on image number 26 of series 5. Index lesion on the right inferiorly measures approximately 2.8 cm in greatest dimension. Gallbladder is well distended with dependent gallstones. No biliary ductal dilatation is noted. Pancreas: Pancreas again demonstrates a cystic lesion in the tail slightly smaller than that seen on the prior exam now measuring 1.6 cm. Spleen: Normal in size without focal abnormality. Adrenals/Urinary Tract: Adrenal glands demonstrate bilateral hypodense lesions stable in appearance from 2014 consistent with adrenal adenomas. Kidneys demonstrate a normal enhancement pattern. No renal calculi or urinary tract obstructive changes are seen. The bladder is partially distended. Stomach/Bowel: Diverticular change of the colon is noted. No definitive colonic mass is seen. The appendix is not well visualized  and likely has been surgically removed. No small bowel or gastric abnormality is seen. Vascular/Lymphatic: Diffuse vascular calcifications are noted in the abdominal aorta and iliac vessels. No aneurysmal dilatation is seen. Some lymphadenopathy is noted in the gastrohepatic ligament and to a greater degree in the portacaval space and peripancreatic region. The largest of these measures approximately 10 mm in short axis. Reproductive: Uterus has been surgically removed. No adnexal mass is noted. Other: No free pelvic  fluid is seen. Musculoskeletal: Degenerative changes of lumbar spine are noted. No compression deformities are seen. Review of the MIP images confirms the above findings. IMPRESSION: CTA of the chest: No evidence of pulmonary emboli. 2.1 cm soft tissue mass lesion in the right upper lung with some peripheral nodularity as well as associated hilar and mediastinal adenopathy. Although this may be related to metastatic disease given the findings in the liver, the possibility of a primary pulmonary neoplasm deserves primary consideration. CT of the abdomen and pelvis: Changes consistent with diffuse hepatic metastatic disease. Tissue sampling is recommended for further evaluation. Associated adenopathy in the upper abdomen is noted as well. Cholelithiasis without complicating factors. Pancreatic cystic lesion in the tail slightly smaller than that seen on prior exam. Bilateral adrenal adenomas stable from the prior study. Diverticulosis without diverticulitis. Electronically Signed   By: Inez Catalina M.D.   On: 11/15/2019 18:45   CT ABDOMEN PELVIS W CONTRAST  Result Date: 11/15/2019 CLINICAL DATA:  Chest and abdominal pain with elevated LFTs EXAM: CT ANGIOGRAPHY CHEST CT ABDOMEN AND PELVIS WITH CONTRAST TECHNIQUE: Multidetector CT imaging of the chest was performed using the standard protocol during bolus administration of intravenous contrast. Multiplanar CT image reconstructions and MIPs were obtained to evaluate the vascular anatomy. Multidetector CT imaging of the abdomen and pelvis was performed using the standard protocol during bolus administration of intravenous contrast. CONTRAST:  169mL OMNIPAQUE IOHEXOL 350 MG/ML SOLN COMPARISON:  06/17/2012 FINDINGS: CTA CHEST FINDINGS Cardiovascular: Thoracic aortic calcifications are noted without aneurysmal dilatation. No cardiac enlargement is seen. Pulmonary artery shows a normal branching pattern. No filling defects are identified. Coronary calcifications are  noted. Mediastinum/Nodes: Thoracic inlet is within normal limits. Multiple prominent mediastinal lymph nodes are noted. The largest of these is a conglomeration of nodes adjacent to the carina measuring 1.9 cm in short axis. 19 mm short axis right hilar lymph node is noted as well. The esophagus is within normal limits. Lungs/Pleura: Mild emphysematous changes are noted in the left lung. The lung is otherwise clear. Right lung demonstrates a rounded soft tissue mass lesion measuring 2.1 x 1.5 cm in the right upper lobe best seen on image number 33 of series 5. Some more peripheral smaller nodular densities are noted as well. No sizable effusion or pneumothorax is noted. Musculoskeletal: Degenerative changes of the thoracic spine are seen. No lytic lesions are noted. Review of the MIP images confirms the above findings. CT ABDOMEN and PELVIS FINDINGS Hepatobiliary: Liver demonstrates multiple peripherally enhancing lesions consistent with metastatic disease. There are too numerous to count although an index lesion in the left lobe measures 2.6 cm in dimension best seen on image number 26 of series 5. Index lesion on the right inferiorly measures approximately 2.8 cm in greatest dimension. Gallbladder is well distended with dependent gallstones. No biliary ductal dilatation is noted. Pancreas: Pancreas again demonstrates a cystic lesion in the tail slightly smaller than that seen on the prior exam now measuring 1.6 cm. Spleen: Normal in size without focal abnormality. Adrenals/Urinary Tract: Adrenal  glands demonstrate bilateral hypodense lesions stable in appearance from 2014 consistent with adrenal adenomas. Kidneys demonstrate a normal enhancement pattern. No renal calculi or urinary tract obstructive changes are seen. The bladder is partially distended. Stomach/Bowel: Diverticular change of the colon is noted. No definitive colonic mass is seen. The appendix is not well visualized and likely has been surgically  removed. No small bowel or gastric abnormality is seen. Vascular/Lymphatic: Diffuse vascular calcifications are noted in the abdominal aorta and iliac vessels. No aneurysmal dilatation is seen. Some lymphadenopathy is noted in the gastrohepatic ligament and to a greater degree in the portacaval space and peripancreatic region. The largest of these measures approximately 10 mm in short axis. Reproductive: Uterus has been surgically removed. No adnexal mass is noted. Other: No free pelvic fluid is seen. Musculoskeletal: Degenerative changes of lumbar spine are noted. No compression deformities are seen. Review of the MIP images confirms the above findings. IMPRESSION: CTA of the chest: No evidence of pulmonary emboli. 2.1 cm soft tissue mass lesion in the right upper lung with some peripheral nodularity as well as associated hilar and mediastinal adenopathy. Although this may be related to metastatic disease given the findings in the liver, the possibility of a primary pulmonary neoplasm deserves primary consideration. CT of the abdomen and pelvis: Changes consistent with diffuse hepatic metastatic disease. Tissue sampling is recommended for further evaluation. Associated adenopathy in the upper abdomen is noted as well. Cholelithiasis without complicating factors. Pancreatic cystic lesion in the tail slightly smaller than that seen on prior exam. Bilateral adrenal adenomas stable from the prior study. Diverticulosis without diverticulitis. Electronically Signed   By: Inez Catalina M.D.   On: 11/15/2019 18:45   US BIOPSY (LIVER)  Result Date: 11/17/2019 INDICATION: 86 year old female with a history multiple liver lesions, concern for metastases EXAM: ULTRASOUND-GUIDED LIVER MASS BIOPSY MEDICATIONS: None. ANESTHESIA/SEDATION: Moderate (conscious) sedation was employed during this procedure. A total of Versed 0.5 mg and Fentanyl 50 mcg was administered intravenously. Moderate Sedation Time: 12 minutes. The patient's  level of consciousness and vital signs were monitored continuously by radiology nursing throughout the procedure under my direct supervision. FLUOROSCOPY TIME:  Ultrasound COMPLICATIONS: None PROCEDURE: Informed written consent was obtained from the patient after a thorough discussion of the procedural risks, benefits and alternatives. All questions were addressed. Maximal Sterile Barrier Technique was utilized including caps, mask, sterile gowns, sterile gloves, sterile drape, hand hygiene and skin antiseptic. A timeout was performed prior to the initiation of the procedure. Ultrasound survey of the right liver lobe performed with images stored and sent to PACs. The infra xiphoid region was prepped with chlorhexidine in a sterile fashion, and a sterile drape was applied covering the operative field. A sterile gown and sterile gloves were used for the procedure. Local anesthesia was provided with 1% Lidocaine. The patient was prepped and draped sterilely and the skin and subcutaneous tissues were generously infiltrated with 1% lidocaine. A 17 gauge introducer needle was then advanced under ultrasound guidance in an intercostal location into the left liver lobe, targeting relatively hyperechoic lesion in the left liver. The stylet was removed, and multiple separate 18 gauge core biopsy were retrieved. Samples were placed into formalin for transportation to the lab. Gel-Foam pledgets were then infused with a small amount of saline for assistance with hemostasis. The needle was removed, and a final ultrasound image was performed. The patient tolerated the procedure well and remained hemodynamically stable throughout. No complications were encountered and no significant blood loss was encounter.  IMPRESSION: Status post ultrasound-guided biopsy of left liver lesion. Signed, Dulcy Fanny. Dellia Nims, RPVI [facility] Electronically Signed   By: Corrie Mckusick D.O.   On:  11/17/2019 12:39   DG Chest Port 1 View  Result Date: 11/15/2019 CLINICAL DATA:  Dyspnea and tachycardia EXAM: PORTABLE CHEST 1 VIEW COMPARISON:  11/03/2018 FINDINGS: Cardiac shadow is stable. Aortic calcifications are again seen. Lungs are well aerated bilaterally. No focal infiltrate or sizable effusion is seen. No bony abnormality is noted. IMPRESSION: No active disease. Electronically Signed   By: Inez Catalina M.D.   On: 11/15/2019 16:28   ECHOCARDIOGRAM COMPLETE  Result Date: 11/17/2019    ECHOCARDIOGRAM REPORT   Patient Name:   Doris Zamora Date of Exam: 11/17/2019 Medical Rec #:  [id number]      Height:       66.0 in Accession #:    [id number]     Weight:       162.0 lb Date of Birth:  01-19-1931      BSA:          1.829 m Patient Age:    56 years       BP:           127/54 mmHg Patient Gender: F              HR:           70 bpm. Exam Location:  Inpatient Procedure: 2D Echo, Cardiac Doppler and Color Doppler Indications:    R06.02 SOB  History:        Patient has no prior history of Echocardiogram examinations.                 Abnormal ECG; Risk Factors:Dyslipidemia. Breast cancer. SVT.                 Tachycardia.  Sonographer:    Roseanna Rainbow RDCS Referring Phys: [place]  1. Left ventricular ejection fraction, by estimation, is 60 to 65%. The left ventricle has normal function. The left ventricle has no regional wall motion abnormalities. There is mild left ventricular hypertrophy. Left ventricular diastolic parameters are consistent with Grade I diastolic dysfunction (impaired relaxation).  2. Right ventricular systolic function is normal. The right ventricular size is normal. There is normal pulmonary artery systolic pressure. The estimated right ventricular systolic pressure is 58.8 mmHg.  3. The mitral valve is normal in structure. Trivial mitral valve regurgitation. No evidence of mitral stenosis.  4. The aortic valve is tricuspid. Aortic valve regurgitation is  moderate. Mild aortic valve stenosis by doppler measurements though visually, the degree of stenosis appears worse. Aortic valve area, by VTI measures 1.65 cm. Aortic valve mean gradient measures 9.0 mmHg.  5. The inferior vena cava is normal in size with greater than 50% respiratory variability, suggesting right atrial pressure of 3 mmHg. FINDINGS  Left Ventricle: Left ventricular ejection fraction, by estimation, is 60 to 65%. The left ventricle has normal function. The left ventricle has no regional wall motion abnormalities. The left ventricular internal cavity size was normal in size. There is  mild left ventricular hypertrophy. Left ventricular diastolic parameters are consistent with Grade I diastolic dysfunction (impaired relaxation). Right Ventricle: The right ventricular size is normal. No increase in right ventricular wall thickness. Right ventricular systolic function is normal. There is normal pulmonary artery systolic pressure. The tricuspid regurgitant velocity is 2.61 m/s, and  with an assumed right atrial  pressure of 3 mmHg, the estimated right ventricular systolic pressure is 20.2 mmHg. Left Atrium: Left atrial size was normal in size. Right Atrium: Right atrial size was normal in size. Pericardium: There is no evidence of pericardial effusion. Mitral Valve: The mitral valve is normal in structure. There is mild calcification of the mitral valve leaflet(s). Mild mitral annular calcification. Trivial mitral valve regurgitation. No evidence of mitral valve stenosis. Tricuspid Valve: The tricuspid valve is normal in structure. Tricuspid valve regurgitation is mild. Aortic Valve: The aortic valve is tricuspid. Aortic valve regurgitation is moderate. Aortic regurgitation PHT measures 403 msec. Mild aortic stenosis is present. Aortic valve mean gradient measures 9.0 mmHg. Aortic valve peak gradient measures 18.4 mmHg.  Aortic valve area, by VTI measures 1.65 cm. Pulmonic Valve: The pulmonic valve was  normal in structure. Pulmonic valve regurgitation is trivial. Aorta: The aortic root is normal in size and structure. Venous: The inferior vena cava is normal in size with greater than 50% respiratory variability, suggesting right atrial pressure of 3 mmHg. IAS/Shunts: No atrial level shunt detected by color flow Doppler.  LEFT VENTRICLE PLAX 2D LVIDd:         3.30 cm     Diastology LVIDs:         2.10 cm     LV e' medial:    6.64 cm/s LV PW:         1.30 cm     LV E/e' medial:  9.6 LV IVS:        1.30 cm     LV e' lateral:   5.44 cm/s LVOT diam:     2.20 cm     LV E/e' lateral: 11.7 LV SV:         71 LV SV Index:   39 LVOT Area:     3.80 cm  LV Volumes (MOD) LV vol d, MOD A2C: 60.8 ml LV vol d, MOD A4C: 60.0 ml LV vol s, MOD A2C: 19.0 ml LV vol s, MOD A4C: 23.9 ml LV SV MOD A2C:     41.8 ml LV SV MOD A4C:     60.0 ml LV SV MOD BP:      40.2 ml RIGHT VENTRICLE            IVC RV S prime:     9.46 cm/s  IVC diam: 1.50 cm TAPSE (M-mode): 2.6 cm LEFT ATRIUM             Index       RIGHT ATRIUM           Index LA diam:        2.60 cm 1.42 cm/m  RA Area:     13.10 cm LA Vol (A2C):   20.4 ml 11.16 ml/m RA Volume:   31.50 ml  17.23 ml/m LA Vol (A4C):   29.6 ml 16.19 ml/m LA Biplane Vol: 25.1 ml 13.73 ml/m  AORTIC VALVE                    PULMONIC VALVE AV Area (Vmax):    1.51 cm     PR End Diast Vel: 1.62 msec AV Area (Vmean):   1.76 cm AV Area (VTI):     1.65 cm AV Vmax:           214.50 cm/s AV Vmean:          135.000 cm/s AV VTI:            0.428  m AV Peak Grad:      18.4 mmHg AV Mean Grad:      9.0 mmHg LVOT Vmax:         85.00 cm/s LVOT Vmean:        62.400 cm/s LVOT VTI:          0.186 m LVOT/AV VTI ratio: 0.44 AI PHT:            403 msec  AORTA Ao Root diam: 2.80 cm Ao Asc diam:  3.50 cm MITRAL VALVE               TRICUSPID VALVE MV Area (PHT): 3.91 cm    TR Peak grad:   27.2 mmHg MV Decel Time: 194 msec    TR Vmax:        261.00 cm/s MV E velocity: 63.80 cm/s MV A velocity: 89.10 cm/s  SHUNTS MV E/A  ratio:  0.72        Systemic VTI:  0.19 m                            Systemic Diam: 2.20 cm Loralie Champagne MD Electronically signed by Loralie Champagne MD Signature Date/Time: 11/17/2019/5:35:12 PM    Final      Subjective: No complaints.  Discharge Exam: Vitals:   11/18/19 0321 11/18/19 0804  BP: (!) 146/59 (!) 142/54  Pulse: 68 68  Resp: 18 18  Temp: 98 F (36.7 C) 97.7 F (36.5 C)  SpO2: 98% 98%   Vitals:   11/17/19 1656 11/17/19 1948 11/18/19 0321 11/18/19 0804  BP: (!) 144/59 (!) 120/56 (!) 146/59 (!) 142/54  Pulse: 71 66 68 68  Resp: 17 18 18 18   Temp: 98.2 F (36.8 C) 97.9 F (36.6 C) 98 F (36.7 C) 97.7 F (36.5 C)  TempSrc: Oral Oral Oral Oral  SpO2: 98% 95% 98% 98%  Weight:   72.5 kg   Height:        General: Pt is alert, awake, not in acute distress Cardiovascular: RRR, S1/S2 +, no rubs, no gallops Respiratory: CTA bilaterally, no wheezing, no rhonchi Abdominal: Soft, NT, ND, bowel sounds + Extremities: no edema, no cyanosis    The results of significant diagnostics from this hospitalization (including imaging, microbiology, ancillary and laboratory) are listed below for reference.     Microbiology: Recent Results (from the past 240 hour(s))  Culture, blood (routine x 2)     Status: None (Preliminary result)   Collection Time: 11/15/19  4:00 PM   Specimen: BLOOD  Result Value Ref Range Status   Specimen Description BLOOD LEFT ANTECUBITAL  Final   Special Requests   Final    BOTTLES DRAWN AEROBIC AND ANAEROBIC Blood Culture adequate volume   Culture   Final    NO GROWTH 3 DAYS Performed at [facility], [address], [place], [place] [address]    Report Status PENDING  Incomplete  Culture, blood (routine x 2)     Status: None (Preliminary result)   Collection Time: 11/15/19  6:40 PM   Specimen: BLOOD  Result Value Ref Range Status   Specimen Description BLOOD BLOOD LEFT FOREARM  Final   Special Requests   Final    BOTTLES DRAWN AEROBIC AND  ANAEROBIC Blood Culture adequate volume   Culture   Final    NO GROWTH 3 DAYS Performed at [facility], [place] [address] [place], [place] [address]  Report Status PENDING  Incomplete  Respiratory Panel by RT PCR (Flu A&B, Covid) - Nasopharyngeal Swab     Status: None   Collection Time: 11/15/19  7:26 PM   Specimen: Nasopharyngeal Swab  Result Value Ref Range Status   SARS Coronavirus 2 by RT PCR NEGATIVE NEGATIVE Final    Comment: (NOTE) SARS-CoV-2 target nucleic acids are NOT DETECTED.  The SARS-CoV-2 RNA is generally detectable in upper respiratoy specimens during the acute phase of infection. The lowest concentration of SARS-CoV-2 viral copies this assay can detect is 131 copies/mL. A negative result does not preclude SARS-Cov-2 infection and should not be used as the sole basis for treatment or other patient management decisions. A negative result may occur with  improper specimen collection/handling, submission of specimen other than nasopharyngeal swab, presence of viral mutation(s) within the areas targeted by this assay, and inadequate number of viral copies (<131 copies/mL). A negative result must be combined with clinical observations, patient history, and epidemiological information. The expected result is Negative.  Fact Sheet for Patients:  PinkCheek.be  Fact Sheet for Healthcare Providers:  GravelBags.it  This test is no t yet approved or cleared by the [place] FDA and  has been authorized for detection and/or diagnosis of SARS-CoV-2 by FDA under an Emergency Use Authorization (EUA). This EUA will remain  in effect (meaning this test can be used) for the duration of the COVID-19 declaration under Section 564(b)(1) of the Act, 21 U.S.C. section 360bbb-3(b)(1), unless the authorization is terminated or revoked sooner.     Influenza A by PCR NEGATIVE NEGATIVE Final   Influenza B by PCR  NEGATIVE NEGATIVE Final    Comment: (NOTE) The Xpert Xpress SARS-CoV-2/FLU/RSV assay is intended as an aid in  the diagnosis of influenza from Nasopharyngeal swab specimens and  should not be used as a sole basis for treatment. Nasal washings and  aspirates are unacceptable for Xpert Xpress SARS-CoV-2/FLU/RSV  testing.  Fact Sheet for Patients: PinkCheek.be  Fact Sheet for Healthcare Providers: GravelBags.it  This test is not yet approved or cleared by the [place] FDA and  has been authorized for detection and/or diagnosis of SARS-CoV-2 by  FDA under an Emergency Use Authorization (EUA). This EUA will remain  in effect (meaning this test can be used) for the duration of the  Covid-19 declaration under Section 564(b)(1) of the Act, 21  U.S.C. section 360bbb-3(b)(1), unless the authorization is  terminated or revoked. Performed at [facility], [place] [address], [place], [place] [address]      Labs: BNP (last 3 results) No results for input(s): BNP in the last 8760 hours. Basic Metabolic Panel: Recent Labs  Lab 11/15/19 1555 11/15/19 1617 11/16/19 0230  NA 134* 138 135  K 4.0 4.3 4.2  CL 103 105 103  CO2 21*  --  23  GLUCOSE 94 92 70  BUN 12 16 9   CREATININE 0.63 0.60 0.60  CALCIUM 10.2  --  9.4   Liver Function Tests: Recent Labs  Lab 11/15/19 1555  AST 283*  ALT 92*  ALKPHOS 203*  BILITOT 1.2  PROT 9.0*  ALBUMIN 3.0*   Recent Labs  Lab 11/15/19 1555  LIPASE 94*   No results for input(s): AMMONIA in the last 168 hours. CBC: Recent Labs  Lab 11/15/19 1546 11/15/19 1617 11/16/19 0230  WBC 7.0  --  6.7  HGB 15.4* 16.7* 13.8  HCT 47.2* 49.0* 43.9  MCV 91.5  --  93.0  PLT 246  --  220   Cardiac Enzymes: No results for input(s): CKTOTAL, CKMB, CKMBINDEX, TROPONINI in the last 168 hours. BNP: Invalid input(s): POCBNP CBG: No results for input(s): GLUCAP in the last 168  hours. D-Dimer Recent Labs    11/15/19 1555  DDIMER 7.45*   Hgb A1c No results for input(s): HGBA1C in the last 72 hours. Lipid Profile No results for input(s): CHOL, HDL, LDLCALC, TRIG, CHOLHDL, LDLDIRECT in the last 72 hours. Thyroid function studies Recent Labs    11/15/19 2144  TSH 2.817   Anemia work up No results for input(s): VITAMINB12, FOLATE, FERRITIN, TIBC, IRON, RETICCTPCT in the last 72 hours. Urinalysis    Component Value Date/Time   COLORURINE YELLOW 11/16/2019 0322   APPEARANCEUR CLEAR 11/16/2019 0322   LABSPEC >1.046 (H) 11/16/2019 0322   PHURINE 5.0 11/16/2019 0322   GLUCOSEU NEGATIVE 11/16/2019 0322   GLUCOSEU NEGATIVE 08/04/2019 1513   HGBUR NEGATIVE 11/16/2019 0322   BILIRUBINUR NEGATIVE 11/16/2019 0322   KETONESUR 20 (A) 11/16/2019 0322   PROTEINUR NEGATIVE 11/16/2019 0322   UROBILINOGEN 1.0 08/04/2019 1513   NITRITE NEGATIVE 11/16/2019 0322   LEUKOCYTESUR NEGATIVE 11/16/2019 0322   Sepsis Labs Invalid input(s): PROCALCITONIN,  WBC,  LACTICIDVEN Microbiology Recent Results (from the past 240 hour(s))  Culture, blood (routine x 2)     Status: None (Preliminary result)   Collection Time: 11/15/19  4:00 PM   Specimen: BLOOD  Result Value Ref Range Status   Specimen Description BLOOD LEFT ANTECUBITAL  Final   Special Requests   Final    BOTTLES DRAWN AEROBIC AND ANAEROBIC Blood Culture adequate volume   Culture   Final    NO GROWTH 3 DAYS Performed at [facility], [place] [address], [place], [place] [address]    Report Status PENDING  Incomplete  Culture, blood (routine x 2)     Status: None (Preliminary result)   Collection Time: 11/15/19  6:40 PM   Specimen: BLOOD  Result Value Ref Range Status   Specimen Description BLOOD BLOOD LEFT FOREARM  Final   Special Requests   Final    BOTTLES DRAWN AEROBIC AND ANAEROBIC Blood Culture adequate volume   Culture   Final    NO GROWTH 3 DAYS Performed at [facility], [place] [address], [place], [place] [address]    Report Status PENDING  Incomplete  Respiratory Panel by RT PCR (Flu A&B, Covid) - Nasopharyngeal Swab     Status: None   Collection Time: 11/15/19  7:26 PM   Specimen: Nasopharyngeal Swab  Result Value Ref Range Status   SARS Coronavirus 2 by RT PCR NEGATIVE NEGATIVE Final    Comment: (NOTE) SARS-CoV-2 target nucleic acids are NOT DETECTED.  The SARS-CoV-2 RNA is generally detectable in upper respiratoy specimens during the acute phase of infection. The lowest concentration of SARS-CoV-2 viral copies this assay can detect is 131 copies/mL. A negative result does not preclude SARS-Cov-2 infection and should not be used as the sole basis for treatment or other patient management decisions. A negative result may occur with  improper specimen collection/handling, submission of specimen other than nasopharyngeal swab, presence of viral mutation(s) within the areas targeted by this assay, and inadequate number of viral copies (<131 copies/mL). A negative result must be combined with clinical observations, patient history, and epidemiological information. The expected result is Negative.  Fact Sheet for Patients:  PinkCheek.be  Fact Sheet for Healthcare Providers:  GravelBags.it  This test is no t yet approved or cleared  by the [place] and  has been authorized for detection and/or diagnosis of SARS-CoV-2 by FDA under an Emergency Use Authorization (EUA). This EUA will remain  in effect (meaning this test can be used) for the duration of the COVID-19 declaration under Section 564(b)(1) of the Act, 21 U.S.C. section 360bbb-3(b)(1), unless the authorization is terminated or revoked sooner.     Influenza A by PCR NEGATIVE NEGATIVE Final   Influenza B by PCR NEGATIVE NEGATIVE Final    Comment: (NOTE) The Xpert Xpress SARS-CoV-2/FLU/RSV assay is intended as an aid in  the diagnosis of  influenza from Nasopharyngeal swab specimens and  should not be used as a sole basis for treatment. Nasal washings and  aspirates are unacceptable for Xpert Xpress SARS-CoV-2/FLU/RSV  testing.  Fact Sheet for Patients: PinkCheek.be  Fact Sheet for Healthcare Providers: GravelBags.it  This test is not yet approved or cleared by the [place] FDA and  has been authorized for detection and/or diagnosis of SARS-CoV-2 by  FDA under an Emergency Use Authorization (EUA). This EUA will remain  in effect (meaning this test can be used) for the duration of the  Covid-19 declaration under Section 564(b)(1) of the Act, 21  U.S.C. section 360bbb-3(b)(1), unless the authorization is  terminated or revoked. Performed at [facility], [place] [address] [place], [place] [address]      Time coordinating discharge: Over 30 minutes  SIGNED:   Charlynne Cousins, MD  [facility] 11/18/2019, 8:47 AM Pager   If 7PM-7AM, please contact night-coverage [email or web address] Password [email or web address]

## 2019-11-18 NOTE — Plan of Care (Signed)
  Problem: Nutrition: Goal: Adequate nutrition will be maintained Outcome: Completed/Met   Problem: Elimination: Goal: Will not experience complications related to bowel motility Outcome: Completed/Met Goal: Will not experience complications related to urinary retention Outcome: Completed/Met

## 2019-11-18 NOTE — TOC Transition Note (Signed)
Transition of Care [facility]) - CM/SW Discharge Note   Patient Details  Name: Doris Zamora MRN: [id number] Date of Birth: [birth year]  [facility] CM/SW Contact:  Zenon Mayo, RN Phone Number: 11/18/2019, 9:42 AM   Clinical Narrative:    Patient for dc today, she would like to have a 3 n 1, NCM made referral to [facility], this will be brought up to room prior to dc.  She has no other needs.   Final next level of care: Home/Self Care Barriers to Discharge: No Barriers Identified   Patient Goals and CMS Choice Patient states their goals for this hospitalization and ongoing recovery are:: get better      Discharge Placement                       Discharge Plan and Services                DME Arranged: 3-N-1 DME Agency: [facility] Date DME Agency Contacted: 11/18/19 Time DME Agency Contacted: [phone] Representative spoke with at DME Agency: Industry: NA          Social Determinants of Health ([place]) Interventions     Readmission Risk Interventions No flowsheet data found.

## 2019-11-18 NOTE — Progress Notes (Signed)
[place]   Telephone:([phone] Fax:([phone]) [phone]   Clinic Follow up Note   Patient Care Team: Biagio Borg, MD as PCP - General (Internal Medicine) [name] Guys, MD as Attending Physician (Ophthalmology) Jovita Kussmaul, MD as Consulting Physician (General Surgery) Truitt Merle, MD as Consulting Physician (Hematology)  Date of Service:  11/22/2019  CHIEF COMPLAINT: Follow-up of left breast cancer  SUMMARY OF ONCOLOGIC HISTORY: Oncology History Overview Note  Cancer Staging Breast cancer, left breast ([place]) Staging form: Breast, AJCC 7th Edition - Clinical: T1c, N0 - Unsigned  Endometrial cancer ([place]) Staging form: Corpus Uteri - Adenosarcoma, AJCC 7th Edition - Clinical: T1b, N0 - Unsigned  Small cell lung cancer, right upper lobe ([place]) Staging form: Lung, AJCC 8th Edition - Clinical stage from 11/19/2019: Stage IVB (cT1c, cN2, pM1c) - Signed by Truitt Merle, MD on 11/19/2019    Endometrial cancer ([place])  08/12/2005 Initial Diagnosis   Endometrial cancer, T1bN0, s/p hysrectomy    Breast cancer, left breast ([place])  01/13/2014 Imaging   mammogram and US showed 3 masses at 6 o'clock, retroareolar and 2 o'clock area, measuring 0.7-1.7cm.    01/26/2014 Initial Diagnosis   Breast cancer, left breast, multifocal (3 lesions, biopsy showed 2 similar lesions with lobular features, and the third lesion has ductal features).    02/10/2014 - 11/22/2019 Anti-estrogen oral therapy   Pt declined surgery, started anastrozole 1 mg once daily started on 02/10/2014. Pt held anastrzole in 08/2016 and restarted 01/05/17. Stopped 11/22/19 given hospice for lung cancer.    09/19/2014 Mammogram    mammogram and ultrasound showed interval decrease in the size and density of the 3 previous biopsy sites of malignancy in the left breast.   08/18/2016 Mammogram   IMPRESSION: Unchanged appearance of the left breast with 3 biopsy proven cancers. No evidence of malignancy in the right breast.    08/18/2016 Imaging   Bone Density 08/18/16 T score -1.4, indicating patient is osteopenic   02/16/2017 Mammogram   Diagnostic bilateral mammogram with tomography: IMPRESSION: No significant interval change in the appearance of the 3 sites of biopsy proven cancer in the left breast. No evidence of right breast malignancy.   04/02/2017 Imaging   Breast MRI  W WO Contrast 04/02/17 IMPRESSION: 1. 2 cm area of very mild non masslike enhancement within the RETROAREOLAR LEFT breast with LEFT nipple retraction compatible with known malignancy in the RETROAREOLAR region. It is difficult to determine interval change from prior studies given different modalities. 2. Biopsy clips within the UPPER-OUTER LEFT breast and LOWER LEFT breast without adjacent abnormal enhancement. 3. No MR evidence of RIGHT breast malignancy.   08/10/2017 Mammogram   08/10/2017 MM Diag Breast TOMO Bilateral IMPRESSION: 1. No significant interval change in the mammographic appearance of 3 sites of biopsy proven left breast cancer. 2. No mammographic evidence of malignancy on the right.   Small cell lung cancer, right upper lobe ([place])  11/15/2019 Imaging   CT Angio CAP  IMPRESSION: CTA of the chest: No evidence of pulmonary emboli.   2.1 cm soft tissue mass lesion in the right upper lung with some peripheral nodularity as well as associated hilar and mediastinal adenopathy. Although this may be related to metastatic disease given the findings in the liver, the possibility of a primary pulmonary neoplasm deserves primary consideration.   CT of the abdomen and pelvis: Changes consistent with diffuse hepatic metastatic disease. Tissue sampling is recommended for further evaluation. Associated adenopathy in the upper abdomen is noted  as well.   Cholelithiasis without complicating factors.   Pancreatic cystic lesion in the tail slightly smaller than that seen on prior exam.   Bilateral adrenal adenomas stable from the prior  study.   Diverticulosis without diverticulitis.     11/17/2019 Initial Biopsy   FINAL MICROSCOPIC DIAGNOSIS:   A. LIVER, NEEDLE CORE BIOPSY:  - Small cell carcinoma.   COMMENT:   Dr. Vic Ripper has reviewed the case. Dr. Burr Medico was notified on 11/18/2019.    11/19/2019 Initial Diagnosis   Small cell lung cancer, right middle lobe ([place])   11/19/2019 Cancer Staging   Staging form: Lung, AJCC 8th Edition - Clinical stage from 11/19/2019: Stage IVB (cT1c, cN2, pM1c) - Signed by Truitt Merle, MD on 11/19/2019      CURRENT THERAPY:  Palliative Care, On Hospice   INTERVAL HISTORY:  [name] is here for a follow up. She presents to the clinic with her daughter. She notes since hospital discharge her daughter tries to make sure she gets up and walks around with cane. She notes she my name and knows she was being treated by me for her breast cancer. She is still fatigued. She is eating half a meal at a time. Her daughter has her on soft food diet so she does not have to chew much along with more liquids. She lost 25 pounds since august and since recent hospital discharge she was able to gain a few pounds.    REVIEW OF SYSTEMS:   Constitutional: Denies fevers, chills (+) weight loss (+) Fatigue  Eyes: Denies blurriness of vision Ears, nose, mouth, throat, and face: Denies mucositis or sore throat Respiratory: Denies cough, dyspnea or wheezes Cardiovascular: Denies palpitation, chest discomfort or lower extremity swelling Gastrointestinal:  Denies nausea, heartburn or change in bowel habits Skin: Denies abnormal skin rashes MSK: (+) Arthritis pain  Lymphatics: Denies new lymphadenopathy or easy bruising Neurological:Denies numbness, tingling or new weaknesses Behavioral/Psych: Mood is stable, no new changes  All other systems were reviewed with the patient and are negative.  MEDICAL HISTORY:  Past Medical History:  Diagnosis Date  . ANXIETY   . Arthritis   . Endometrial cancer ([place])  2007  . Hearing loss   . Heartburn    occ  . Hyperglycemia 04/20/2014  . HYPERLIPIDEMIA   . Hypothyroidism   . INSOMNIA-SLEEP DISORDER-UNSPEC   . Peripheral edema   . Seasonal allergies   . Venous insufficiency   . WEIGHT LOSS     SURGICAL HISTORY: Past Surgical History:  Procedure Laterality Date  . ABDOMINAL HYSTERECTOMY  2007  . BREAST BIOPSY Left 01/26/2014   x3, malignant  . CATARACT EXTRACTION, BILATERAL    . OOPHORECTOMY  2007  . THYROIDECTOMY, PARTIAL  1983  . TOTAL KNEE ARTHROPLASTY Left 11/08/2018   Procedure: LEFT TOTAL KNEE ARTHROPLASTY;  Surgeon: Frederik Pear, MD;  Location: [facility];  Service: Orthopedics;  Laterality: Left;    I have reviewed the social history and family history with the patient and they are unchanged from previous note.  ALLERGIES:  is allergic to synthroid [levothyroxine sodium] and pneumovax [pneumococcal polysaccharide vaccine].  MEDICATIONS:  Current Outpatient Medications  Medication Sig Dispense Refill  . ALPRAZolam (XANAX) 0.5 MG tablet TAKE 1 TABLET(0.5 MG) BY MOUTH DAILY AS NEEDED (Patient taking differently: Take 0.5 mg by mouth at bedtime as needed for anxiety. ) 30 tablet 5  . anastrozole (ARIMIDEX) 1 MG tablet TAKE 1 TABLET(1 MG) BY MOUTH DAILY (Patient taking differently: Take 1  mg by mouth daily. ) 30 tablet 1  . BLACK CURRANT SEED OIL PO Take 1 tablet by mouth daily.     . cetirizine (ZYRTEC) 10 MG tablet TAKE 1 TABLET(10 MG) BY MOUTH DAILY (Patient taking differently: Take 10 mg by mouth daily. ) 30 tablet 5  . cholecalciferol (VITAMIN D) 1000 UNITS tablet Take 1,000 Units by mouth daily.     . Coenzyme Q10 (COQ10) 100 MG CAPS Take 100 mg by mouth daily.    . fluticasone (FLONASE) 50 MCG/ACT nasal spray Place 2 sprays into both nostrils daily. 16 g 2  . Krill Oil 350 MG CAPS Take 350 mg by mouth daily.    . Menthol, Topical Analgesic, (BIOFREEZE EX) Apply 1 patch topically daily as needed (pain).    . metoprolol tartrate  (LOPRESSOR) 25 MG tablet Take 1 tablet (25 mg total) by mouth 2 (two) times daily. 30 tablet 2  . mirtazapine (REMERON) 7.5 MG tablet Take 1 tablet (7.5 mg total) by mouth at bedtime. 30 tablet 1  . OVER THE COUNTER MEDICATION Take 1 tablet by mouth daily. Instaflex    . potassium chloride (KLOR-CON) 10 MEQ tablet TAKE 1 TABLET BY MOUTH EVERY DAY AS NEEDED FOR WITH USE OF LASIX (Patient taking differently: Take 10 mEq by mouth daily. ) 30 tablet 5  . Thiamine HCl (VITAMIN B-1) 250 MG tablet Take 250 mg by mouth daily.    . traMADol (ULTRAM) 50 MG tablet Take 1 tablet (50 mg total) by mouth every 6 (six) hours as needed. (Patient taking differently: Take 50 mg by mouth every 6 (six) hours as needed for moderate pain. ) 30 tablet 0  . vitamin B-12 (CYANOCOBALAMIN) 1000 MCG tablet Take 1,000 mcg by mouth daily.    . vitamin E 400 UNIT capsule Take 400 Units by mouth daily.     No current facility-administered medications for this visit.    PHYSICAL EXAMINATION: ECOG PERFORMANCE STATUS: 3 - Symptomatic, >50% confined to bed  Vitals:   11/22/19 0925  BP: (!) 129/56  Pulse: 65  Resp: 18  Temp: (!) 95 F (35 C)  SpO2: 100%   Filed Weights   11/22/19 0925  Weight: 164 lb 7 oz (74.6 kg)    Due to COVID19 we will limit examination to appearance. Patient had no complaints.  GENERAL:alert, no distress and comfortable SKIN: skin color normal, no rashes or significant lesions EYES: normal, Conjunctiva are pink and non-injected, sclera clear  NEURO: alert & oriented x 3 with fluent speech    LABORATORY DATA:  I have reviewed the data as listed CBC Latest Ref Rng & Units 11/16/2019 11/15/2019 11/15/2019  WBC 4.0 - 10.5 K/uL 6.7 - 7.0  Hemoglobin 12.0 - 15.0 g/dL 13.8 16.7(H) 15.4(H)  Hematocrit 36 - 46 % 43.9 49.0(H) 47.2(H)  Platelets 150 - 400 K/uL 220 - 246     CMP Latest Ref Rng & Units 11/16/2019 11/15/2019 11/15/2019  Glucose 70 - 99 mg/dL 70 92 94  BUN 8 - 23 mg/dL _0 Creatinine 0.44 - 1.00 mg/dL 0.60 0.60 0.63  Sodium 135 - 145 mmol/L 135 138 134(L)  Potassium 3.5 - 5.1 mmol/L 4.2 4.3 4.0  Chloride 98 - 111 mmol/L 103 105 103  CO2 22 - 32 mmol/L 23 - 21(L)  Calcium 8.9 - 10.3 mg/dL 9.4 - 10.2  Total Protein 6.5 - 8.1 g/dL - - 9.0(H)  Total Bilirubin 0.3 - 1.2 mg/dL - - 1.2  Alkaline  Phos 38 - 126 U/L - - 203(H)  AST 15 - 41 U/L - - 283(H)  ALT 0 - 44 U/L - - 92(H)      RADIOGRAPHIC STUDIES: I have personally reviewed the radiological images as listed and agreed with the findings in the report. No results found.   ASSESSMENT & PLAN:  Doris Zamora is a 86 y.o. female with    1. Small Cell lung Cancer, Stage IV -During work up for recent hospitalization she was found to have liver metastasis and right lung mass with mediastinal adenopathy on 11/15/19 CT CAP. Her 11/17/19 Liver biopsy showed small cell carcinoma. I discussed this with patient and her daughter in great detail today.  Based on the scan findings, this is consistent with lung primary.  This is likely from her 20 year history of smoking.  -I discussed with distant metastasis her cancer is stage IV and not curable at this point. Surgery is not an option. I discussed small cell lung cancer is very aggressive and her prognosis is poor.  -I discussed standard treatment is systemic chemotherapy and immunotherapy. Radiation is not an option given her diffuse disease in liver and lungs. I discussed without treatment her lung cancer can take her life and likely soon. She has apprehension about chemotherapy treatment as she feels she cannot tolerate it.  After lengthy discussion, she declined.  Given her advanced age and decreased performance status this is reasonable decision.  -Given she is not on treatment, I discussed the option of Hospice Home care to manage her symptoms and quality of life.  We discussed the benefit and the logistics of hospice service.  She and her daughter are interested in  Hospice. I will send referral. If she is no longer able to be cared for at home, she can go to local facility.  -I will continue to oversee her overall care.  -F/u open    2. Goal of care discussion, DRN/DNI -We again discussed the incurable nature of her cancer, and the overall poor prognosis, especially given her aggressive lung cancer and no treatment.  -The patient understands the goal of care is palliative. -I recommend DNR/DNI, she agreed.  -She agreed to proceed with hospice.    3. Symptom Management: Weight Loss, Fatigue  -She is currently on soft foods diet. She has been able to gain weight since her hospital discharge.  -I will call in Mirtazapine to help her appetite and eating (11/14/19).  -I encouraged her to remain active for as long as she can, but take it slow. She ambulates with cane.  -She currently has tramadol for her arthritis pain. If she needs stronger pain medication as her cancer progresses I will prescribe.    4. Left breast cancer, 3 synchronized lesions, 2 of them with lobular features and one has ductal features. T1b-1cN0M0, stage I, ER+/PR+/HER2- -She was diagnosed in 01/2014. She has declined surgery, genetics, Radiation and chemo.  -She started treatment with antiestrogen anastrozole in 02/2014.  -Given she will proceed with hospice, she can stop Anastrozole.   5. History of endometrial cancer status post hysterectomy in 2007 6. Arthritis, Osteopenia, On Tramadol -Her main pain is in her left hip, mainly with walking. She uses Tramadol as needed.   7. Transaminitis  -Mild progression on 09/14/19 labs, but worsened given liver mets from small cell lung cancer progression.    Plan -I called in Mirtazapine today for her anorexia -Okay to stop Anastrozole since she will be under hospice  care -Cancel Mammogram and [place] Referral  -F/u open, I will remain to be her MD when she is under hospice care    No problem-specific Assessment &  Plan notes found for this encounter.   Orders Placed This Encounter  Procedures  . Ambulatory referral to Hospice    Referral Priority:   Routine    Referral Type:   Consultation    Referral Reason:   Specialty Services Required    Requested Specialty:   Hospice Services    Number of Visits Requested:   1   All questions were answered. The patient knows to call the clinic with any problems, questions or concerns. No barriers to learning was detected. The total time spent in the appointment was 45 minutes.     Truitt Merle, MD 11/22/2019   I, Joslyn Devon, am acting as scribe for Truitt Merle, MD.   I have reviewed the above documentation for accuracy and completeness, and I agree with the above.

## 2019-11-18 NOTE — Progress Notes (Signed)
D/C instructions given and reviewed with pt and daughter at bedside. Questions answered and encouraged to call with further concerns. Tele and IV removed, tolerated well.

## 2019-11-18 NOTE — Plan of Care (Signed)
  Problem: Education: Goal: Knowledge of General Education information will improve Description: Including pain rating scale, medication(s)/side effects and non-pharmacologic comfort measures Outcome: Adequate for Discharge   Problem: Health Behavior/Discharge Planning: Goal: Ability to manage health-related needs will improve Outcome: Adequate for Discharge   Problem: Clinical Measurements: Goal: Ability to maintain clinical measurements within normal limits will improve Outcome: Adequate for Discharge Goal: Will remain free from infection Outcome: Adequate for Discharge Goal: Diagnostic test results will improve Outcome: Adequate for Discharge Goal: Respiratory complications will improve Outcome: Adequate for Discharge Goal: Cardiovascular complication will be avoided Outcome: Adequate for Discharge   Problem: Activity: Goal: Risk for activity intolerance will decrease Outcome: Adequate for Discharge   Problem: Coping: Goal: Level of anxiety will decrease Outcome: Adequate for Discharge   Problem: Pain Managment: Goal: General experience of comfort will improve Outcome: Adequate for Discharge   Problem: Safety: Goal: Ability to remain free from injury will improve Outcome: Adequate for Discharge   Problem: Skin Integrity: Goal: Risk for impaired skin integrity will decrease Outcome: Adequate for Discharge   

## 2019-11-18 NOTE — Progress Notes (Addendum)
Physical Therapy Treatment Patient Details Name: Doris Zamora MRN: [id number] DOB: 1930/04/22 Today's Date: 11/18/2019    History of Present Illness pt is an 86 y/o female with h/o Breast CA, endometrial CA, has lost about 30 LBs unintentinally over 6 weeks admitted from PCP with tachycardia in SVT which spontaneously converted back in the ED.    PT Comments    Patient progressing well towards PT goals. Reports feeling well today. Requires Min A for bed mobility and MIn guard-Min A at times during gait especially with turns. Requires use of RW for support. Noted to have impaired endurance and balance deficits impacting safe mobility. VSS on RA- HR 70-80s bpm with 2 PVCs and Sp02 100% post activity.  Discharge recommendation updated to Home with HHPT due to above. Will continue to follow.   Follow Up Recommendations  Home health PT;Supervision - Intermittent     Equipment Recommendations  3in1 (PT)    Recommendations for Other Services       Precautions / Restrictions Precautions Precautions: Fall Restrictions Weight Bearing Restrictions: No    Mobility  Bed Mobility Overal bed mobility: Needs Assistance Bed Mobility: Supine to Sit;Sit to Supine     Supine to sit: Min assist;HOB elevated Sit to supine: Supervision   General bed mobility comments: ASsist with trunk to get to EOB with use of rails as well. No dizziness.  Transfers Overall transfer level: Needs assistance Equipment used: Rolling walker (2 wheeled) Transfers: Sit to/from Stand Sit to Stand: Min guard         General transfer comment: Min guard for safety. Stood from [employer].  Ambulation/Gait Ambulation/Gait assistance: Min guard;Min assist Gait Distance (Feet): 120 Feet Assistive device: Rolling walker (2 wheeled) Gait Pattern/deviations: Step-through pattern;Antalgic;Trunk flexed Gait velocity: .89 ft/sec Gait velocity interpretation: <1.8 ft/sec, indicate of risk for recurrent falls General  Gait Details: Slow, mildly unsteady gait esp with turns needing Min A for balance; antalgic initially which improved with distance. Cues for upright. Sp02 100% on RA. HR 70-80s bpm.   Stairs             Wheelchair Mobility    Modified Rankin (Stroke Patients Only)       Balance Overall balance assessment: Needs assistance Sitting-balance support: Feet supported;No upper extremity supported Sitting balance-Leahy Scale: Fair     Standing balance support: During functional activity Standing balance-Leahy Scale: Poor Standing balance comment: Requires UE support for dynamic tasks.                            Cognition Arousal/Alertness: Awake/alert Behavior During Therapy: WFL for tasks assessed/performed Overall Cognitive Status: No family/caregiver present to determine baseline cognitive functioning                                 General Comments: HOH, needs repetition to follow multi step commands. Impaired memory. Not sure of baseline.      Exercises      General Comments General comments (skin integrity, edema, etc.): VSS on RA.      Pertinent Vitals/Pain Pain Assessment: No/denies pain    Home Living                      Prior Function            PT Goals (current goals can now be found in the care plan section)  Progress towards PT goals: Progressing toward goals    Frequency    Min 3X/week      PT Plan Discharge plan needs to be updated    Co-evaluation              AM-PAC PT "6 Clicks" Mobility   Outcome Measure  Help needed turning from your back to your side while in a flat bed without using bedrails?: A Little Help needed moving from lying on your back to sitting on the side of a flat bed without using bedrails?: A Little Help needed moving to and from a bed to a chair (including a wheelchair)?: A Little Help needed standing up from a chair using your arms (e.g., wheelchair or bedside chair)?: A  Little Help needed to walk in hospital room?: A Little Help needed climbing 3-5 steps with a railing? : A Little 6 Click Score: 18    End of Session Equipment Utilized During Treatment: Gait belt Activity Tolerance: Patient tolerated treatment well Patient left: in bed;with call bell/phone within reach;with bed alarm set Nurse Communication: Mobility status PT Visit Diagnosis: Unsteadiness on feet (R26.81);Other abnormalities of gait and mobility (R26.89);Muscle weakness (generalized) (M62.81)     Time: 0800-0820 PT Time Calculation (min) (ACUTE ONLY): 20 min  Charges:  $Gait Training: 8-22 mins                     Marisa Severin, PT, DPT Acute Rehabilitation Services Pager [phone] Office [place] 11/18/2019, 8:30 AM

## 2019-11-19 DIAGNOSIS — C3411 Malignant neoplasm of upper lobe, right bronchus or lung: Secondary | ICD-10-CM | POA: Insufficient documentation

## 2019-11-19 NOTE — Progress Notes (Signed)
START ON PATHWAY REGIMEN - Small Cell Lung     Cycles 1 through 4, every 21 days:     Atezolizumab      Carboplatin      Etoposide    Cycles 5 and beyond, every 21 days:     Atezolizumab   **Always confirm dose/schedule in your pharmacy ordering system**  Patient Characteristics: Newly Diagnosed, Preoperative or Nonsurgical Candidate (Clinical Staging), First Line, Extensive Stage Therapeutic Status: Newly Diagnosed, Preoperative or Nonsurgical Candidate (Clinical Staging) AJCC T Category: cT1b AJCC N Category: cN2 AJCC M Category: pM1c AJCC 8 Stage Grouping: IVB Stage Classification: Extensive  Intent of Therapy: Non-Curative / Palliative Intent, Discussed with Patient

## 2019-11-20 LAB — CULTURE, BLOOD (ROUTINE X 2)
Culture: NO GROWTH
Culture: NO GROWTH
Special Requests: ADEQUATE
Special Requests: ADEQUATE

## 2019-11-22 ENCOUNTER — Telehealth: Payer: Self-pay

## 2019-11-22 ENCOUNTER — Inpatient Hospital Stay: Payer: Medicare Other | Attending: Hematology | Admitting: Hematology

## 2019-11-22 ENCOUNTER — Encounter: Payer: Self-pay | Admitting: Hematology

## 2019-11-22 ENCOUNTER — Other Ambulatory Visit: Payer: Self-pay

## 2019-11-22 VITALS — BP 129/56 | HR 65 | Temp 95.0°F | Resp 18 | Ht 66.0 in | Wt 164.4 lb

## 2019-11-22 DIAGNOSIS — Z8542 Personal history of malignant neoplasm of other parts of uterus: Secondary | ICD-10-CM | POA: Insufficient documentation

## 2019-11-22 DIAGNOSIS — M8580 Other specified disorders of bone density and structure, unspecified site: Secondary | ICD-10-CM | POA: Diagnosis not present

## 2019-11-22 DIAGNOSIS — C50912 Malignant neoplasm of unspecified site of left female breast: Secondary | ICD-10-CM | POA: Insufficient documentation

## 2019-11-22 DIAGNOSIS — Z170 Estrogen receptor positive status [ER+]: Secondary | ICD-10-CM | POA: Diagnosis not present

## 2019-11-22 DIAGNOSIS — Z800 Family history of malignant neoplasm of digestive organs: Secondary | ICD-10-CM | POA: Insufficient documentation

## 2019-11-22 DIAGNOSIS — C3411 Malignant neoplasm of upper lobe, right bronchus or lung: Secondary | ICD-10-CM | POA: Diagnosis present

## 2019-11-22 DIAGNOSIS — Z79899 Other long term (current) drug therapy: Secondary | ICD-10-CM | POA: Insufficient documentation

## 2019-11-22 DIAGNOSIS — M1990 Unspecified osteoarthritis, unspecified site: Secondary | ICD-10-CM | POA: Insufficient documentation

## 2019-11-22 DIAGNOSIS — C342 Malignant neoplasm of middle lobe, bronchus or lung: Secondary | ICD-10-CM | POA: Diagnosis not present

## 2019-11-22 DIAGNOSIS — Z803 Family history of malignant neoplasm of breast: Secondary | ICD-10-CM | POA: Diagnosis not present

## 2019-11-22 DIAGNOSIS — R634 Abnormal weight loss: Secondary | ICD-10-CM | POA: Insufficient documentation

## 2019-11-22 DIAGNOSIS — R5383 Other fatigue: Secondary | ICD-10-CM | POA: Insufficient documentation

## 2019-11-22 DIAGNOSIS — Z79811 Long term (current) use of aromatase inhibitors: Secondary | ICD-10-CM | POA: Diagnosis not present

## 2019-11-22 DIAGNOSIS — M25552 Pain in left hip: Secondary | ICD-10-CM | POA: Diagnosis not present

## 2019-11-22 DIAGNOSIS — R7401 Elevation of levels of liver transaminase levels: Secondary | ICD-10-CM | POA: Diagnosis not present

## 2019-11-22 DIAGNOSIS — C787 Secondary malignant neoplasm of liver and intrahepatic bile duct: Secondary | ICD-10-CM | POA: Diagnosis present

## 2019-11-22 MED ORDER — MIRTAZAPINE 7.5 MG PO TABS: 7.5000 mg | ORAL_TABLET | Freq: Every day | ORAL | 1 refills | Status: AC

## 2019-11-22 NOTE — Telephone Encounter (Signed)
Referral, face sheet, and last ov note faxed to [place] for hospice services.

## 2019-11-29 ENCOUNTER — Other Ambulatory Visit: Payer: Self-pay

## 2019-11-29 ENCOUNTER — Encounter: Payer: Self-pay | Admitting: Internal Medicine

## 2019-11-29 ENCOUNTER — Ambulatory Visit (INDEPENDENT_AMBULATORY_CARE_PROVIDER_SITE_OTHER): Payer: Medicare Other | Admitting: Internal Medicine

## 2019-11-29 VITALS — BP 100/60 | HR 76 | Temp 97.8°F | Ht 66.0 in | Wt 161.0 lb

## 2019-11-29 DIAGNOSIS — I471 Supraventricular tachycardia, unspecified: Secondary | ICD-10-CM

## 2019-11-29 DIAGNOSIS — M79672 Pain in left foot: Secondary | ICD-10-CM

## 2019-11-29 DIAGNOSIS — C3411 Malignant neoplasm of upper lobe, right bronchus or lung: Secondary | ICD-10-CM

## 2019-11-29 DIAGNOSIS — R739 Hyperglycemia, unspecified: Secondary | ICD-10-CM

## 2019-11-29 DIAGNOSIS — F411 Generalized anxiety disorder: Secondary | ICD-10-CM

## 2019-11-29 MED ORDER — METOPROLOL TARTRATE 25 MG PO TABS: 25.0000 mg | ORAL_TABLET | Freq: Two times a day (BID) | ORAL | 11 refills | Status: AC

## 2019-11-29 MED ORDER — METOPROLOL TARTRATE 25 MG PO TABS
25.0000 mg | ORAL_TABLET | Freq: Two times a day (BID) | ORAL | 11 refills | Status: DC
Start: 2019-11-29 — End: 2019-11-29

## 2019-11-29 NOTE — Patient Instructions (Signed)
Please use the heel cushions at night to help take the pressure and irritation off the heals at night  Please continue all other medications as before, and refills have been done if requested.  Please have the pharmacy call with any other refills you may need.  Please keep your appointments with your specialists as you may have planned

## 2019-11-29 NOTE — Progress Notes (Signed)
Subjective:    Patient ID: Doris Zamora, female    DOB: 1931/01/05, 86 y.o.   MRN: [id number]  HPI  Here to f/u with family after hospn oct 5-8-  89 y.o.femalepast medical history of breast cancer on anastrozole declined mastectomy, history of endometrial cancer status post hysterectomy has had a poor appetite and not eating well for the past 2 weeks which has lost a considerable amount of weight over the last weeks. Went to see the PCP on the day of admission was found to be tachycardic referred to the ED and found to be in SVT which spontaneously converted back. CT angio of the chest and abdomen was done that showed a right upper lung lesion and adenopathy along with liver lesions concerning for metastatic disease.   She has been turned is converted to sinus rhythm she was started on metoprolol cardiology was consulted which agreed with plan.  2D echo was done which showed EF 60% and grade 1 diastolic heart failure.  Also, has small cell lung cancer by pathology, and metastatic dz on imaging. Also hx of Metastatic stage IV breast cancer .  Palliative care was consulted they decided to continue current treatment and plan offered by oncology.  Liver biopsy was done on 11/17/2019 and discussed results with oncology as an outpatient. Palliative care to continue to follow-up as an outpatient Physical therapy evaluated the patient the recommended home health PT.which pt has been getting.  Denies worsening depressive symptoms, suicidal ideation, or panic  Also has mild left heel pain and tenderness, sleeps on her back Past Medical History:  Diagnosis Date  . ANXIETY   . Arthritis   . Endometrial cancer ([place]) 2007  . Hearing loss   . Heartburn    occ  . Hyperglycemia 04/20/2014  . HYPERLIPIDEMIA   . Hypothyroidism   . INSOMNIA-SLEEP DISORDER-UNSPEC   . Peripheral edema   . Seasonal allergies   . Venous insufficiency   . WEIGHT LOSS    Past Surgical History:  Procedure Laterality Date  .  ABDOMINAL HYSTERECTOMY  2007  . BREAST BIOPSY Left 01/26/2014   x3, malignant  . CATARACT EXTRACTION, BILATERAL    . OOPHORECTOMY  2007  . THYROIDECTOMY, PARTIAL  1983  . TOTAL KNEE ARTHROPLASTY Left 11/08/2018   Procedure: LEFT TOTAL KNEE ARTHROPLASTY;  Surgeon: Frederik Pear, MD;  Location: [facility];  Service: Orthopedics;  Laterality: Left;    reports that she quit smoking about 25 years ago. Her smoking use included cigarettes. She quit after 50.00 years of use. She has never used smokeless tobacco. She reports current alcohol use of about 7.0 standard drinks of alcohol per week. She reports that she does not use drugs. family history includes Cancer (age of onset: 30) in an other family member; Cancer (age of onset: 76) in her sister; Diabetes in her mother; Muscular dystrophy in her father. Allergies  Allergen Reactions  . Synthroid [Levothyroxine Sodium] Swelling    Happened yrs ago  . Pneumovax [Pneumococcal Polysaccharide Vaccine] Swelling   Current Outpatient Medications on File Prior to Visit  Medication Sig Dispense Refill  . ALPRAZolam (XANAX) 0.5 MG tablet TAKE 1 TABLET(0.5 MG) BY MOUTH DAILY AS NEEDED (Patient taking differently: Take 0.5 mg by mouth at bedtime as needed for anxiety. ) 30 tablet 5  . anastrozole (ARIMIDEX) 1 MG tablet TAKE 1 TABLET(1 MG) BY MOUTH DAILY (Patient taking differently: Take 1 mg by mouth daily. ) 30 tablet 1  . BLACK CURRANT SEED OIL  PO Take 1 tablet by mouth daily.     . cetirizine (ZYRTEC) 10 MG tablet TAKE 1 TABLET(10 MG) BY MOUTH DAILY (Patient taking differently: Take 10 mg by mouth daily. ) 30 tablet 5  . cholecalciferol (VITAMIN D) 1000 UNITS tablet Take 1,000 Units by mouth daily.     . Coenzyme Q10 (COQ10) 100 MG CAPS Take 100 mg by mouth daily.    . fluticasone (FLONASE) 50 MCG/ACT nasal spray Place 2 sprays into both nostrils daily. 16 g 2  . Krill Oil 350 MG CAPS Take 350 mg by mouth daily.    . Menthol, Topical Analgesic, (BIOFREEZE EX)  Apply 1 patch topically daily as needed (pain).    . mirtazapine (REMERON) 7.5 MG tablet Take 1 tablet (7.5 mg total) by mouth at bedtime. 30 tablet 1  . OVER THE COUNTER MEDICATION Take 1 tablet by mouth daily. Instaflex    . potassium chloride (KLOR-CON) 10 MEQ tablet TAKE 1 TABLET BY MOUTH EVERY DAY AS NEEDED FOR WITH USE OF LASIX (Patient taking differently: Take 10 mEq by mouth daily. ) 30 tablet 5  . Thiamine HCl (VITAMIN B-1) 250 MG tablet Take 250 mg by mouth daily.    . traMADol (ULTRAM) 50 MG tablet Take 1 tablet (50 mg total) by mouth every 6 (six) hours as needed. (Patient taking differently: Take 50 mg by mouth every 6 (six) hours as needed for moderate pain. ) 30 tablet 0  . vitamin B-12 (CYANOCOBALAMIN) 1000 MCG tablet Take 1,000 mcg by mouth daily.    . vitamin E 400 UNIT capsule Take 400 Units by mouth daily.     No current facility-administered medications on file prior to visit.   Transitional Care Management elements noted today: 1)  Date of D/C: as above 2)  Medication reconciliation:  done today at end visit 3)  Review of D/C summary or other information:  done today 4)  Review of need for f/u on pending diagnostic tests and treatments:  done today 5)  Review of need for Interaction with other providers who will assume or resume care of pt specific problems: done today 6)  Education of patient/family/guardian or caregiver: done today 7)  Assess for Establishment or Re-establishment of referrals and arranging for needed community resources:  done today 8)  Assess for Assistance in scheduling any required follow up with community providers and services:  done today  Review of Systems All otherwise neg per pt    Objective:   Physical Exam BP 100/60 (BP Location: Left Arm, Patient Position: Sitting, Cuff Size: Large)   Pulse 76   Temp 97.8 F (36.6 C) (Oral)   Ht 5\' 6"  (1.676 m)   Wt 161 lb (73 kg)   SpO2 97%   BMI 25.99 kg/m  VS noted,  Constitutional: Pt  appears in NAD HENT: Head: NCAT.  Right Ear: External ear normal.  Left Ear: External ear normal.  Eyes: . Pupils are equal, round, and reactive to light. Conjunctivae and EOM are normal Nose: without d/c or deformity Neck: Neck supple. Gross normal ROM Cardiovascular: Normal rate and regular rhythm.   Pulmonary/Chest: Effort normal and breath sounds without rales or wheezing.  Abd:  Soft, NT, ND, + BS, no organomegaly Neurological: Pt is alert. At baseline orientation, motor grossly intact Skin: Skin is warm. No rashes, other new lesions, no LE edema Psychiatric: Pt behavior is normal without agitation  All otherwise neg per pt Lab Results  Component Value Date  WBC 6.7 11/16/2019   HGB 13.8 11/16/2019   HCT 43.9 11/16/2019   PLT 220 11/16/2019   GLUCOSE 70 11/16/2019   CHOL 180 08/04/2019   TRIG 61.0 08/04/2019   HDL 64.50 08/04/2019   LDLDIRECT 129.4 02/14/2008   LDLCALC 104 (H) 08/04/2019   ALT 92 (H) 11/15/2019   AST 283 (H) 11/15/2019   NA 135 11/16/2019   K 4.2 11/16/2019   CL 103 11/16/2019   CREATININE 0.60 11/16/2019   BUN 9 11/16/2019   CO2 23 11/16/2019   TSH 2.817 11/15/2019   INR 1.2 11/17/2019   HGBA1C 5.3 08/04/2019      Assessment & Plan:

## 2019-12-04 ENCOUNTER — Encounter: Payer: Self-pay | Admitting: Internal Medicine

## 2019-12-04 NOTE — Assessment & Plan Note (Signed)
stable overall by history and exam, recent data reviewed with pt, and pt to continue medical treatment as before,  to f/u any worsening symptoms or concerns  

## 2019-12-04 NOTE — Assessment & Plan Note (Signed)
For heel cushions at night

## 2019-12-04 NOTE — Assessment & Plan Note (Signed)
For oncology f/u as planned

## 2019-12-21 ENCOUNTER — Other Ambulatory Visit: Payer: Medicare Other

## 2020-01-12 ENCOUNTER — Telehealth: Payer: Self-pay

## 2020-01-12 NOTE — Telephone Encounter (Signed)
We received fax notification that Doris Zamora passed away on January 14, 2020.

## 2020-02-11 DEATH — deceased

## 2020-03-15 ENCOUNTER — Ambulatory Visit: Payer: Medicare Other | Admitting: Hematology

## 2020-03-15 ENCOUNTER — Other Ambulatory Visit: Payer: Medicare Other

## 2020-07-03 ENCOUNTER — Telehealth: Payer: Self-pay | Admitting: Internal Medicine

## 2020-07-03 NOTE — Telephone Encounter (Signed)
LVM for pt to rtn my call to schedule AWV with [facility]. Please schedule AWV if pt calls the office  

## 2020-08-15 ENCOUNTER — Telehealth: Payer: Self-pay

## 2020-08-15 NOTE — Telephone Encounter (Signed)
This nurse spoke with patients daughter who was requesting to know how to get her mothers medical records.  This nurse advised patient to come to center and complete the request, we will obtain a copy of your id.  This nurse advised per HIM that she should receive her records within 1-2 weeks.  Acknowledged understanding.  No further questions at this time.
